# Patient Record
Sex: Female | Born: 1980 | Race: Black or African American | Hispanic: No | Marital: Single | Smoking: Never smoker
Health system: Southern US, Community
[De-identification: ages and names within clinical notes are randomized; demographics above are authoritative.]

## PROBLEM LIST (undated history)

## (undated) DIAGNOSIS — R0602 Shortness of breath: Secondary | ICD-10-CM

## (undated) DIAGNOSIS — R29898 Other symptoms and signs involving the musculoskeletal system: Secondary | ICD-10-CM

## (undated) DIAGNOSIS — K859 Acute pancreatitis without necrosis or infection, unspecified: Secondary | ICD-10-CM

## (undated) DIAGNOSIS — F419 Anxiety disorder, unspecified: Secondary | ICD-10-CM

## (undated) DIAGNOSIS — Z973 Presence of spectacles and contact lenses: Secondary | ICD-10-CM

## (undated) DIAGNOSIS — Z86718 Personal history of other venous thrombosis and embolism: Secondary | ICD-10-CM

## (undated) DIAGNOSIS — R002 Palpitations: Secondary | ICD-10-CM

## (undated) DIAGNOSIS — K567 Ileus, unspecified: Secondary | ICD-10-CM

## (undated) DIAGNOSIS — R5383 Other fatigue: Secondary | ICD-10-CM

## (undated) DIAGNOSIS — G5623 Lesion of ulnar nerve, bilateral upper limbs: Secondary | ICD-10-CM

## (undated) DIAGNOSIS — K5901 Slow transit constipation: Secondary | ICD-10-CM

## (undated) DIAGNOSIS — L0390 Cellulitis, unspecified: Secondary | ICD-10-CM

## (undated) DIAGNOSIS — D259 Leiomyoma of uterus, unspecified: Secondary | ICD-10-CM

## (undated) DIAGNOSIS — M5416 Radiculopathy, lumbar region: Secondary | ICD-10-CM

## (undated) DIAGNOSIS — G4733 Obstructive sleep apnea (adult) (pediatric): Secondary | ICD-10-CM

## (undated) DIAGNOSIS — K581 Irritable bowel syndrome with constipation: Secondary | ICD-10-CM

## (undated) DIAGNOSIS — M549 Dorsalgia, unspecified: Secondary | ICD-10-CM

## (undated) DIAGNOSIS — G8929 Other chronic pain: Secondary | ICD-10-CM

## (undated) DIAGNOSIS — G250 Essential tremor: Secondary | ICD-10-CM

## (undated) DIAGNOSIS — I38 Endocarditis, valve unspecified: Secondary | ICD-10-CM

## (undated) DIAGNOSIS — N3010 Interstitial cystitis (chronic) without hematuria: Secondary | ICD-10-CM

## (undated) DIAGNOSIS — I81 Portal vein thrombosis: Secondary | ICD-10-CM

## (undated) DIAGNOSIS — R000 Tachycardia, unspecified: Secondary | ICD-10-CM

## (undated) DIAGNOSIS — M5126 Other intervertebral disc displacement, lumbar region: Secondary | ICD-10-CM

## (undated) DIAGNOSIS — D649 Anemia, unspecified: Secondary | ICD-10-CM

## (undated) DIAGNOSIS — K219 Gastro-esophageal reflux disease without esophagitis: Secondary | ICD-10-CM

## (undated) HISTORY — DX: Dorsalgia, unspecified: M54.9

## (undated) HISTORY — PX: GASTRECTOMY: SHX58

## (undated) HISTORY — PX: LUMBAR DISC SURGERY: SHX700

## (undated) HISTORY — DX: Anxiety disorder, unspecified: F41.9

## (undated) HISTORY — PX: OTHER SURGICAL HISTORY: SHX169

## (undated) HISTORY — PX: CHOLECYSTECTOMY: SHX55

## (undated) HISTORY — DX: Irritable bowel syndrome with constipation: K58.1

## (undated) HISTORY — DX: Slow transit constipation: K59.01

## (undated) HISTORY — DX: Anemia, unspecified: D64.9

## (undated) HISTORY — DX: Acute pancreatitis without necrosis or infection, unspecified: K85.90

## (undated) HISTORY — DX: Gastro-esophageal reflux disease without esophagitis: K21.9

## (undated) HISTORY — PX: BARIATRIC SURGERY: SHX1103

## (undated) HISTORY — DX: Personal history of other venous thrombosis and embolism: Z86.718

## (undated) HISTORY — PX: APPENDECTOMY: SHX54

## (undated) HISTORY — PX: OVARIAN CYST REMOVAL: SHX89

---
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
## 1999-01-30 DIAGNOSIS — Z8632 Personal history of gestational diabetes: Secondary | ICD-10-CM

## 1999-01-30 HISTORY — DX: Personal history of gestational diabetes: Z86.32

## 2004-12-07 ENCOUNTER — Ambulatory Visit (HOSPITAL_COMMUNITY): Admission: RE | Admit: 2004-12-07 | Discharge: 2004-12-07 | Payer: Self-pay | Admitting: Internal Medicine

## 2004-12-28 ENCOUNTER — Emergency Department (HOSPITAL_COMMUNITY): Admission: EM | Admit: 2004-12-28 | Discharge: 2004-12-28 | Payer: Self-pay | Admitting: Emergency Medicine

## 2005-02-04 ENCOUNTER — Emergency Department (HOSPITAL_COMMUNITY): Admission: EM | Admit: 2005-02-04 | Discharge: 2005-02-04 | Payer: Self-pay | Admitting: Emergency Medicine

## 2005-02-09 ENCOUNTER — Encounter: Admission: RE | Admit: 2005-02-09 | Discharge: 2005-02-09 | Payer: Self-pay | Admitting: Internal Medicine

## 2005-10-12 ENCOUNTER — Encounter: Admission: RE | Admit: 2005-10-12 | Discharge: 2005-10-12 | Payer: Self-pay | Admitting: Internal Medicine

## 2006-08-15 ENCOUNTER — Emergency Department (HOSPITAL_COMMUNITY): Admission: EM | Admit: 2006-08-15 | Discharge: 2006-08-15 | Payer: Self-pay | Admitting: Emergency Medicine

## 2007-09-18 ENCOUNTER — Encounter: Admission: RE | Admit: 2007-09-18 | Discharge: 2007-10-29 | Payer: Self-pay | Admitting: Urology

## 2007-10-31 ENCOUNTER — Encounter: Admission: RE | Admit: 2007-10-31 | Discharge: 2007-10-31 | Payer: Self-pay | Admitting: Internal Medicine

## 2007-12-16 ENCOUNTER — Encounter: Admission: RE | Admit: 2007-12-16 | Discharge: 2008-03-15 | Payer: Self-pay | Admitting: Urology

## 2008-01-16 ENCOUNTER — Ambulatory Visit (HOSPITAL_COMMUNITY): Admission: RE | Admit: 2008-01-16 | Discharge: 2008-01-16 | Payer: Self-pay | Admitting: Obstetrics & Gynecology

## 2008-03-18 ENCOUNTER — Encounter: Admission: RE | Admit: 2008-03-18 | Discharge: 2008-04-15 | Payer: Self-pay | Admitting: Urology

## 2008-05-18 ENCOUNTER — Encounter (INDEPENDENT_AMBULATORY_CARE_PROVIDER_SITE_OTHER): Payer: Self-pay | Admitting: Urology

## 2008-05-18 ENCOUNTER — Ambulatory Visit (HOSPITAL_BASED_OUTPATIENT_CLINIC_OR_DEPARTMENT_OTHER): Admission: RE | Admit: 2008-05-18 | Discharge: 2008-05-18 | Payer: Self-pay | Admitting: Urology

## 2008-08-12 ENCOUNTER — Emergency Department (HOSPITAL_COMMUNITY): Admission: EM | Admit: 2008-08-12 | Discharge: 2008-08-12 | Payer: Self-pay | Admitting: Emergency Medicine

## 2009-04-07 ENCOUNTER — Inpatient Hospital Stay (HOSPITAL_COMMUNITY): Admission: AD | Admit: 2009-04-07 | Discharge: 2009-04-07 | Payer: Self-pay | Admitting: Obstetrics

## 2009-04-08 ENCOUNTER — Ambulatory Visit (HOSPITAL_COMMUNITY): Admission: RE | Admit: 2009-04-08 | Discharge: 2009-04-08 | Payer: Self-pay | Admitting: Obstetrics & Gynecology

## 2009-05-13 ENCOUNTER — Ambulatory Visit (HOSPITAL_COMMUNITY): Admission: RE | Admit: 2009-05-13 | Discharge: 2009-05-13 | Payer: Self-pay | Admitting: Obstetrics & Gynecology

## 2009-06-03 ENCOUNTER — Ambulatory Visit (HOSPITAL_COMMUNITY): Admission: RE | Admit: 2009-06-03 | Discharge: 2009-06-03 | Payer: Self-pay | Admitting: Obstetrics & Gynecology

## 2009-06-23 ENCOUNTER — Ambulatory Visit (HOSPITAL_COMMUNITY): Admission: RE | Admit: 2009-06-23 | Discharge: 2009-06-23 | Payer: Self-pay | Admitting: Obstetrics & Gynecology

## 2009-06-25 ENCOUNTER — Inpatient Hospital Stay (HOSPITAL_COMMUNITY): Admission: AD | Admit: 2009-06-25 | Discharge: 2009-06-25 | Payer: Self-pay | Admitting: Obstetrics & Gynecology

## 2009-06-25 ENCOUNTER — Ambulatory Visit: Payer: Self-pay | Admitting: Family

## 2009-07-11 ENCOUNTER — Inpatient Hospital Stay (HOSPITAL_COMMUNITY): Admission: AD | Admit: 2009-07-11 | Discharge: 2009-07-11 | Payer: Self-pay | Admitting: Obstetrics & Gynecology

## 2009-07-11 ENCOUNTER — Ambulatory Visit: Payer: Self-pay | Admitting: Obstetrics and Gynecology

## 2009-08-04 ENCOUNTER — Ambulatory Visit (HOSPITAL_COMMUNITY): Admission: RE | Admit: 2009-08-04 | Discharge: 2009-08-04 | Payer: Self-pay | Admitting: Obstetrics & Gynecology

## 2009-08-23 ENCOUNTER — Inpatient Hospital Stay (HOSPITAL_COMMUNITY): Admission: AD | Admit: 2009-08-23 | Discharge: 2009-08-23 | Payer: Self-pay | Admitting: Obstetrics

## 2009-08-26 ENCOUNTER — Ambulatory Visit (HOSPITAL_COMMUNITY): Admission: RE | Admit: 2009-08-26 | Discharge: 2009-08-26 | Payer: Self-pay | Admitting: Obstetrics & Gynecology

## 2009-09-15 ENCOUNTER — Ambulatory Visit (HOSPITAL_COMMUNITY): Admission: RE | Admit: 2009-09-15 | Discharge: 2009-09-15 | Payer: Self-pay | Admitting: Obstetrics & Gynecology

## 2009-09-29 ENCOUNTER — Inpatient Hospital Stay (HOSPITAL_COMMUNITY): Admission: AD | Admit: 2009-09-29 | Discharge: 2009-10-03 | Payer: Self-pay | Admitting: Obstetrics & Gynecology

## 2009-10-05 ENCOUNTER — Ambulatory Visit: Payer: Self-pay | Admitting: Cardiology

## 2009-10-05 ENCOUNTER — Inpatient Hospital Stay (HOSPITAL_COMMUNITY): Admission: AD | Admit: 2009-10-05 | Discharge: 2009-10-10 | Payer: Self-pay | Admitting: Obstetrics

## 2009-10-05 ENCOUNTER — Ambulatory Visit: Payer: Self-pay | Admitting: Emergency Medicine

## 2009-10-06 ENCOUNTER — Ambulatory Visit (HOSPITAL_COMMUNITY): Admission: RE | Admit: 2009-10-06 | Discharge: 2009-10-06 | Payer: Self-pay | Admitting: Obstetrics & Gynecology

## 2009-10-17 ENCOUNTER — Ambulatory Visit: Payer: Self-pay | Admitting: Obstetrics and Gynecology

## 2009-10-17 ENCOUNTER — Inpatient Hospital Stay (HOSPITAL_COMMUNITY): Admission: AD | Admit: 2009-10-17 | Discharge: 2009-10-17 | Payer: Self-pay | Admitting: Obstetrics & Gynecology

## 2009-10-20 ENCOUNTER — Ambulatory Visit (HOSPITAL_COMMUNITY): Admission: RE | Admit: 2009-10-20 | Discharge: 2009-10-20 | Payer: Self-pay | Admitting: Obstetrics

## 2009-10-31 ENCOUNTER — Inpatient Hospital Stay (HOSPITAL_COMMUNITY): Admission: AD | Admit: 2009-10-31 | Discharge: 2009-10-31 | Payer: Self-pay | Admitting: Obstetrics

## 2009-10-31 ENCOUNTER — Ambulatory Visit: Payer: Self-pay | Admitting: Family

## 2009-11-01 ENCOUNTER — Ambulatory Visit (HOSPITAL_COMMUNITY): Admission: RE | Admit: 2009-11-01 | Discharge: 2009-11-01 | Payer: Self-pay | Admitting: Obstetrics & Gynecology

## 2009-11-08 ENCOUNTER — Encounter: Payer: Self-pay | Admitting: Obstetrics

## 2009-11-08 ENCOUNTER — Inpatient Hospital Stay (HOSPITAL_COMMUNITY): Admission: AD | Admit: 2009-11-08 | Discharge: 2009-11-11 | Payer: Self-pay | Admitting: Obstetrics

## 2009-11-11 ENCOUNTER — Ambulatory Visit: Payer: Self-pay | Admitting: Obstetrics and Gynecology

## 2009-11-11 ENCOUNTER — Inpatient Hospital Stay (HOSPITAL_COMMUNITY): Admission: AD | Admit: 2009-11-11 | Discharge: 2009-11-11 | Payer: Self-pay | Admitting: Obstetrics

## 2009-11-20 ENCOUNTER — Encounter (INDEPENDENT_AMBULATORY_CARE_PROVIDER_SITE_OTHER): Payer: Self-pay | Admitting: Emergency Medicine

## 2009-11-20 ENCOUNTER — Emergency Department (HOSPITAL_COMMUNITY): Admission: EM | Admit: 2009-11-20 | Discharge: 2009-11-20 | Payer: Self-pay | Admitting: Emergency Medicine

## 2009-11-20 ENCOUNTER — Ambulatory Visit (HOSPITAL_COMMUNITY): Admission: RE | Admit: 2009-11-20 | Discharge: 2009-11-20 | Payer: Self-pay | Admitting: Emergency Medicine

## 2009-11-20 ENCOUNTER — Ambulatory Visit: Payer: Self-pay | Admitting: Vascular Surgery

## 2010-02-18 ENCOUNTER — Encounter: Payer: Self-pay | Admitting: Obstetrics & Gynecology

## 2010-02-19 ENCOUNTER — Encounter: Payer: Self-pay | Admitting: Obstetrics & Gynecology

## 2010-02-19 ENCOUNTER — Encounter: Payer: Self-pay | Admitting: Obstetrics

## 2010-04-12 LAB — BASIC METABOLIC PANEL
BUN: 6 mg/dL (ref 6–23)
CO2: 26 mEq/L (ref 19–32)
Calcium: 8.9 mg/dL (ref 8.4–10.5)
Chloride: 111 mEq/L (ref 96–112)
Creatinine, Ser: 0.72 mg/dL (ref 0.4–1.2)
GFR calc Af Amer: >60 mL/min (ref >60)
GFR calc non Af Amer: >60 mL/min (ref >60)
Glucose, Bld: 89 mg/dL (ref 70–99)
Potassium: 3.4 mEq/L — ABNORMAL LOW (ref 3.5–5.1)
Sodium: 141 mEq/L (ref 135–145)

## 2010-04-12 LAB — POCT CARDIAC MARKERS
CKMB, poc: <1 ng/mL — ABNORMAL LOW (ref 1.0–8.0)
Myoglobin, poc: 43.5 ng/mL (ref 12–200)
Troponin i, poc: <0.05 ng/mL (ref 0.00–0.09)

## 2010-04-12 LAB — CBC
HCT: 34.8 % — ABNORMAL LOW (ref 36.0–46.0)
Hemoglobin: 11.2 g/dL — ABNORMAL LOW (ref 12.0–15.0)
MCH: 24.7 pg — ABNORMAL LOW (ref 26.0–34.0)
MCHC: 32.3 g/dL (ref 30.0–36.0)
MCV: 76.6 fL — ABNORMAL LOW (ref 78.0–100.0)
Platelets: 326 10*3/uL (ref 150–400)
RBC: 4.54 MIL/uL (ref 3.87–5.11)
RDW: 17.1 % — ABNORMAL HIGH (ref 11.5–15.5)
WBC: 5.8 10*3/uL (ref 4.0–10.5)

## 2010-04-12 LAB — DIFFERENTIAL
Basophils Absolute: 0 10*3/uL (ref 0.0–0.1)
Basophils Relative: 1 % (ref 0–1)
Eosinophils Absolute: 0.1 10*3/uL (ref 0.0–0.7)
Eosinophils Relative: 2 % (ref 0–5)
Lymphocytes Relative: 26 % (ref 12–46)
Lymphs Abs: 1.5 10*3/uL (ref 0.7–4.0)
Monocytes Absolute: 0.6 10*3/uL (ref 0.1–1.0)
Monocytes Relative: 10 % (ref 3–12)
Neutro Abs: 3.6 10*3/uL (ref 1.7–7.7)
Neutrophils Relative %: 61 % (ref 43–77)

## 2010-04-12 LAB — D-DIMER, QUANTITATIVE: D-Dimer, Quant: 1.82 ug/mL-FEU — ABNORMAL HIGH (ref 0.00–0.48)

## 2010-04-13 LAB — URINE CULTURE
Colony Count: >=100000
Colony Count: NO GROWTH
Culture  Setup Time: 201109020536
Culture  Setup Time: 201110150111
Culture: NO GROWTH
Special Requests: NEGATIVE

## 2010-04-13 LAB — MRSA PCR SCREENING: MRSA by PCR: NEGATIVE

## 2010-04-13 LAB — GLUCOSE, CAPILLARY
Glucose-Capillary: 102 mg/dL — ABNORMAL HIGH (ref 70–99)
Glucose-Capillary: 102 mg/dL — ABNORMAL HIGH (ref 70–99)
Glucose-Capillary: 102 mg/dL — ABNORMAL HIGH (ref 70–99)
Glucose-Capillary: 103 mg/dL — ABNORMAL HIGH (ref 70–99)
Glucose-Capillary: 103 mg/dL — ABNORMAL HIGH (ref 70–99)
Glucose-Capillary: 107 mg/dL — ABNORMAL HIGH (ref 70–99)
Glucose-Capillary: 107 mg/dL — ABNORMAL HIGH (ref 70–99)
Glucose-Capillary: 110 mg/dL — ABNORMAL HIGH (ref 70–99)
Glucose-Capillary: 112 mg/dL — ABNORMAL HIGH (ref 70–99)
Glucose-Capillary: 116 mg/dL — ABNORMAL HIGH (ref 70–99)
Glucose-Capillary: 117 mg/dL — ABNORMAL HIGH (ref 70–99)
Glucose-Capillary: 118 mg/dL — ABNORMAL HIGH (ref 70–99)
Glucose-Capillary: 118 mg/dL — ABNORMAL HIGH (ref 70–99)
Glucose-Capillary: 124 mg/dL — ABNORMAL HIGH (ref 70–99)
Glucose-Capillary: 131 mg/dL — ABNORMAL HIGH (ref 70–99)
Glucose-Capillary: 132 mg/dL — ABNORMAL HIGH (ref 70–99)
Glucose-Capillary: 134 mg/dL — ABNORMAL HIGH (ref 70–99)
Glucose-Capillary: 135 mg/dL — ABNORMAL HIGH (ref 70–99)
Glucose-Capillary: 136 mg/dL — ABNORMAL HIGH (ref 70–99)
Glucose-Capillary: 141 mg/dL — ABNORMAL HIGH (ref 70–99)
Glucose-Capillary: 150 mg/dL — ABNORMAL HIGH (ref 70–99)
Glucose-Capillary: 154 mg/dL — ABNORMAL HIGH (ref 70–99)
Glucose-Capillary: 160 mg/dL — ABNORMAL HIGH (ref 70–99)
Glucose-Capillary: 162 mg/dL — ABNORMAL HIGH (ref 70–99)
Glucose-Capillary: 171 mg/dL — ABNORMAL HIGH (ref 70–99)
Glucose-Capillary: 46 mg/dL — ABNORMAL LOW (ref 70–99)
Glucose-Capillary: 55 mg/dL — ABNORMAL LOW (ref 70–99)
Glucose-Capillary: 61 mg/dL — ABNORMAL LOW (ref 70–99)
Glucose-Capillary: 61 mg/dL — ABNORMAL LOW (ref 70–99)
Glucose-Capillary: 62 mg/dL — ABNORMAL LOW (ref 70–99)
Glucose-Capillary: 72 mg/dL (ref 70–99)
Glucose-Capillary: 73 mg/dL (ref 70–99)
Glucose-Capillary: 73 mg/dL (ref 70–99)
Glucose-Capillary: 74 mg/dL (ref 70–99)
Glucose-Capillary: 76 mg/dL (ref 70–99)
Glucose-Capillary: 76 mg/dL (ref 70–99)
Glucose-Capillary: 76 mg/dL (ref 70–99)
Glucose-Capillary: 79 mg/dL (ref 70–99)
Glucose-Capillary: 80 mg/dL (ref 70–99)
Glucose-Capillary: 84 mg/dL (ref 70–99)
Glucose-Capillary: 88 mg/dL (ref 70–99)
Glucose-Capillary: 90 mg/dL (ref 70–99)
Glucose-Capillary: 91 mg/dL (ref 70–99)
Glucose-Capillary: 95 mg/dL (ref 70–99)
Glucose-Capillary: 95 mg/dL (ref 70–99)
Glucose-Capillary: 97 mg/dL (ref 70–99)

## 2010-04-13 LAB — COMPREHENSIVE METABOLIC PANEL
ALT: 21 U/L (ref 0–35)
ALT: 23 U/L (ref 0–35)
AST: 20 U/L (ref 0–37)
AST: 21 U/L (ref 0–37)
Albumin: 2.8 g/dL — ABNORMAL LOW (ref 3.5–5.2)
Albumin: 2.9 g/dL — ABNORMAL LOW (ref 3.5–5.2)
Alkaline Phosphatase: 198 U/L — ABNORMAL HIGH (ref 39–117)
Alkaline Phosphatase: 162 U/L — ABNORMAL HIGH (ref 39–117)
BUN: 3 mg/dL — ABNORMAL LOW (ref 6–23)
BUN: 3 mg/dL — ABNORMAL LOW (ref 6–23)
CO2: 21 mEq/L (ref 19–32)
CO2: 22 mEq/L (ref 19–32)
Calcium: 9.2 mg/dL (ref 8.4–10.5)
Calcium: 9.2 mg/dL (ref 8.4–10.5)
Chloride: 106 mEq/L (ref 96–112)
Chloride: 107 mEq/L (ref 96–112)
Creatinine, Ser: 0.59 mg/dL (ref 0.4–1.2)
Creatinine, Ser: 0.62 mg/dL (ref 0.4–1.2)
GFR calc Af Amer: >60 mL/min (ref >60)
GFR calc Af Amer: >60 mL/min (ref >60)
GFR calc non Af Amer: >60 mL/min (ref >60)
GFR calc non Af Amer: >60 mL/min (ref >60)
Glucose, Bld: 65 mg/dL — ABNORMAL LOW (ref 70–99)
Glucose, Bld: 127 mg/dL — ABNORMAL HIGH (ref 70–99)
Potassium: 4.1 mEq/L (ref 3.5–5.1)
Potassium: 3.6 mEq/L (ref 3.5–5.1)
Sodium: 136 mEq/L (ref 135–145)
Sodium: 136 mEq/L (ref 135–145)
Total Bilirubin: 0.6 mg/dL (ref 0.3–1.2)
Total Bilirubin: 0.5 mg/dL (ref 0.3–1.2)
Total Protein: 6.8 g/dL (ref 6.0–8.3)
Total Protein: 6.6 g/dL (ref 6.0–8.3)

## 2010-04-13 LAB — CK TOTAL AND CKMB (NOT AT ARMC)
CK, MB: 1.4 ng/mL (ref 0.3–4.0)
Relative Index: INVALID (ref 0.0–2.5)
Total CK: 22 U/L (ref 7–177)

## 2010-04-13 LAB — CBC
HCT: 30 % — ABNORMAL LOW (ref 36.0–46.0)
HCT: 33.3 % — ABNORMAL LOW (ref 36.0–46.0)
HCT: 34.1 % — ABNORMAL LOW (ref 36.0–46.0)
HCT: 35.7 % — ABNORMAL LOW (ref 36.0–46.0)
HCT: 36.3 % (ref 36.0–46.0)
HCT: 37.2 % (ref 36.0–46.0)
HCT: 37.6 % (ref 36.0–46.0)
Hemoglobin: 10.9 g/dL — ABNORMAL LOW (ref 12.0–15.0)
Hemoglobin: 11 g/dL — ABNORMAL LOW (ref 12.0–15.0)
Hemoglobin: 11.7 g/dL — ABNORMAL LOW (ref 12.0–15.0)
Hemoglobin: 11.8 g/dL — ABNORMAL LOW (ref 12.0–15.0)
Hemoglobin: 11.9 g/dL — ABNORMAL LOW (ref 12.0–15.0)
Hemoglobin: 12.2 g/dL (ref 12.0–15.0)
Hemoglobin: 9.8 g/dL — ABNORMAL LOW (ref 12.0–15.0)
MCH: 25 pg — ABNORMAL LOW (ref 26.0–34.0)
MCH: 25.2 pg — ABNORMAL LOW (ref 26.0–34.0)
MCH: 25.3 pg — ABNORMAL LOW (ref 26.0–34.0)
MCH: 25.5 pg — ABNORMAL LOW (ref 26.0–34.0)
MCH: 25.7 pg — ABNORMAL LOW (ref 26.0–34.0)
MCH: 26.1 pg (ref 26.0–34.0)
MCH: 26.1 pg (ref 26.0–34.0)
MCHC: 32.1 g/dL (ref 30.0–36.0)
MCHC: 32.4 g/dL (ref 30.0–36.0)
MCHC: 32.4 g/dL (ref 30.0–36.0)
MCHC: 32.6 g/dL (ref 30.0–36.0)
MCHC: 32.7 g/dL (ref 30.0–36.0)
MCHC: 32.7 g/dL (ref 30.0–36.0)
MCHC: 32.8 g/dL (ref 30.0–36.0)
MCV: 77.8 fL — ABNORMAL LOW (ref 78.0–100.0)
MCV: 78 fL (ref 78.0–100.0)
MCV: 78.1 fL (ref 78.0–100.0)
MCV: 78.1 fL (ref 78.0–100.0)
MCV: 78.7 fL (ref 78.0–100.0)
MCV: 79.6 fL (ref 78.0–100.0)
MCV: 79.7 fL (ref 78.0–100.0)
Platelets: 193 10*3/uL (ref 150–400)
Platelets: 208 10*3/uL (ref 150–400)
Platelets: 227 10*3/uL (ref 150–400)
Platelets: 247 10*3/uL (ref 150–400)
Platelets: 267 10*3/uL (ref 150–400)
Platelets: 267 10*3/uL (ref 150–400)
Platelets: 277 10*3/uL (ref 150–400)
RBC: 3.85 MIL/uL — ABNORMAL LOW (ref 3.87–5.11)
RBC: 4.18 MIL/uL (ref 3.87–5.11)
RBC: 4.37 MIL/uL (ref 3.87–5.11)
RBC: 4.49 MIL/uL (ref 3.87–5.11)
RBC: 4.61 MIL/uL (ref 3.87–5.11)
RBC: 4.77 MIL/uL (ref 3.87–5.11)
RBC: 4.83 MIL/uL (ref 3.87–5.11)
RDW: 15.1 % (ref 11.5–15.5)
RDW: 15.3 % (ref 11.5–15.5)
RDW: 15.4 % (ref 11.5–15.5)
RDW: 15.8 % — ABNORMAL HIGH (ref 11.5–15.5)
RDW: 15.9 % — ABNORMAL HIGH (ref 11.5–15.5)
RDW: 16 % — ABNORMAL HIGH (ref 11.5–15.5)
RDW: 16.4 % — ABNORMAL HIGH (ref 11.5–15.5)
WBC: 7.4 10*3/uL (ref 4.0–10.5)
WBC: 8.1 10*3/uL (ref 4.0–10.5)
WBC: 8.3 10*3/uL (ref 4.0–10.5)
WBC: 8.4 10*3/uL (ref 4.0–10.5)
WBC: 8.8 10*3/uL (ref 4.0–10.5)
WBC: 8.8 10*3/uL (ref 4.0–10.5)
WBC: 9.5 10*3/uL (ref 4.0–10.5)

## 2010-04-13 LAB — URINALYSIS, ROUTINE W REFLEX MICROSCOPIC
Bilirubin Urine: NEGATIVE
Bilirubin Urine: NEGATIVE
Glucose, UA: NEGATIVE mg/dL
Glucose, UA: NEGATIVE mg/dL
Hgb urine dipstick: NEGATIVE
Ketones, ur: NEGATIVE mg/dL
Ketones, ur: NEGATIVE mg/dL
Nitrite: NEGATIVE
Nitrite: NEGATIVE
Protein, ur: NEGATIVE mg/dL
Protein, ur: NEGATIVE mg/dL
Specific Gravity, Urine: 1.01 (ref 1.005–1.030)
Specific Gravity, Urine: <1.005 — ABNORMAL LOW (ref 1.005–1.030)
Urobilinogen, UA: 0.2 mg/dL (ref 0.0–1.0)
Urobilinogen, UA: 0.2 mg/dL (ref 0.0–1.0)
pH: 6 (ref 5.0–8.0)
pH: 7 (ref 5.0–8.0)

## 2010-04-13 LAB — BLOOD GAS, ARTERIAL
Acid-base deficit: 1 mmol/L (ref 0.0–2.0)
Acid-base deficit: 2.3 mmol/L — ABNORMAL HIGH (ref 0.0–2.0)
Bicarbonate: 20.3 mEq/L (ref 20.0–24.0)
Bicarbonate: 22 mEq/L (ref 20.0–24.0)
Drawn by: 132
Drawn by: 308031
FIO2: 0.21 %
FIO2: 1 %
O2 Content: 10 L/min
O2 Saturation: 100 %
O2 Saturation: 96 %
TCO2: 21.2 mmol/L (ref 0–100)
TCO2: 23 mmol/L (ref 0–100)
pCO2 arterial: 30.4 mmHg — ABNORMAL LOW (ref 35.0–45.0)
pCO2 arterial: 32.3 mmHg — ABNORMAL LOW (ref 35.0–45.0)
pH, Arterial: 7.439 — ABNORMAL HIGH (ref 7.350–7.400)
pH, Arterial: 7.448 — ABNORMAL HIGH (ref 7.350–7.400)
pO2, Arterial: 517 mmHg — ABNORMAL HIGH (ref 80.0–100.0)
pO2, Arterial: 75.5 mmHg — ABNORMAL LOW (ref 80.0–100.0)

## 2010-04-13 LAB — URINALYSIS, DIPSTICK ONLY
Bilirubin Urine: NEGATIVE
Glucose, UA: NEGATIVE mg/dL
Hgb urine dipstick: NEGATIVE
Ketones, ur: 40 mg/dL — AB
Leukocytes, UA: NEGATIVE
Nitrite: NEGATIVE
Protein, ur: NEGATIVE mg/dL
Specific Gravity, Urine: 1.02 (ref 1.005–1.030)
Urobilinogen, UA: 0.2 mg/dL (ref 0.0–1.0)
pH: 6 (ref 5.0–8.0)

## 2010-04-13 LAB — RH IMMUNE GLOB WKUP(>/=20WKS)(NOT WOMEN'S HOSP)
Fetal Screen: NEGATIVE
Unit division: 0

## 2010-04-13 LAB — BASIC METABOLIC PANEL
BUN: 6 mg/dL (ref 6–23)
CO2: 21 mEq/L (ref 19–32)
Calcium: 9 mg/dL (ref 8.4–10.5)
Chloride: 107 mEq/L (ref 96–112)
Creatinine, Ser: 0.62 mg/dL (ref 0.4–1.2)
GFR calc Af Amer: >60 mL/min (ref >60)
GFR calc non Af Amer: >60 mL/min (ref >60)
Glucose, Bld: 121 mg/dL — ABNORMAL HIGH (ref 70–99)
Potassium: 3.5 mEq/L (ref 3.5–5.1)
Sodium: 137 mEq/L (ref 135–145)

## 2010-04-13 LAB — SURGICAL PCR SCREEN
MRSA, PCR: NEGATIVE
Staphylococcus aureus: POSITIVE — AB

## 2010-04-13 LAB — RPR
RPR Ser Ql: NONREACTIVE
RPR Ser Ql: NONREACTIVE
RPR Ser Ql: NONREACTIVE

## 2010-04-13 LAB — URINE MICROSCOPIC-ADD ON

## 2010-04-13 LAB — D-DIMER, QUANTITATIVE: D-Dimer, Quant: 0.39 ug/mL-FEU (ref 0.00–0.48)

## 2010-04-13 LAB — BRAIN NATRIURETIC PEPTIDE: Pro B Natriuretic peptide (BNP): <30 pg/mL (ref 0.0–100.0)

## 2010-04-13 LAB — TROPONIN I: Troponin I: 0.05 ng/mL (ref 0.00–0.06)

## 2010-04-13 LAB — GLUCOSE, RANDOM: Glucose, Bld: 71 mg/dL (ref 70–99)

## 2010-04-15 LAB — COMPREHENSIVE METABOLIC PANEL
ALT: 16 U/L (ref 0–35)
AST: 15 U/L (ref 0–37)
Albumin: 2.8 g/dL — ABNORMAL LOW (ref 3.5–5.2)
Alkaline Phosphatase: 125 U/L — ABNORMAL HIGH (ref 39–117)
BUN: 4 mg/dL — ABNORMAL LOW (ref 6–23)
CO2: 23 mEq/L (ref 19–32)
Calcium: 8.8 mg/dL (ref 8.4–10.5)
Chloride: 104 mEq/L (ref 96–112)
Creatinine, Ser: 0.6 mg/dL (ref 0.4–1.2)
GFR calc Af Amer: >60 mL/min (ref >60)
GFR calc non Af Amer: >60 mL/min (ref >60)
Glucose, Bld: 78 mg/dL (ref 70–99)
Potassium: 3.5 mEq/L (ref 3.5–5.1)
Sodium: 134 mEq/L — ABNORMAL LOW (ref 135–145)
Total Bilirubin: 0.2 mg/dL — ABNORMAL LOW (ref 0.3–1.2)
Total Protein: 6.8 g/dL (ref 6.0–8.3)

## 2010-04-15 LAB — DIFFERENTIAL
Basophils Absolute: 0 10*3/uL (ref 0.0–0.1)
Basophils Relative: 0 % (ref 0–1)
Eosinophils Absolute: 0.1 10*3/uL (ref 0.0–0.7)
Eosinophils Relative: 1 % (ref 0–5)
Lymphocytes Relative: 14 % (ref 12–46)
Lymphs Abs: 1.5 10*3/uL (ref 0.7–4.0)
Monocytes Absolute: 0.6 10*3/uL (ref 0.1–1.0)
Monocytes Relative: 6 % (ref 3–12)
Neutro Abs: 8.6 10*3/uL — ABNORMAL HIGH (ref 1.7–7.7)
Neutrophils Relative %: 79 % — ABNORMAL HIGH (ref 43–77)

## 2010-04-15 LAB — CBC
HCT: 33.9 % — ABNORMAL LOW (ref 36.0–46.0)
Hemoglobin: 11.2 g/dL — ABNORMAL LOW (ref 12.0–15.0)
MCH: 26.5 pg (ref 26.0–34.0)
MCHC: 33.1 g/dL (ref 30.0–36.0)
MCV: 80.1 fL (ref 78.0–100.0)
Platelets: 275 10*3/uL (ref 150–400)
RBC: 4.23 MIL/uL (ref 3.87–5.11)
RDW: 15.1 % (ref 11.5–15.5)
WBC: 10.8 10*3/uL — ABNORMAL HIGH (ref 4.0–10.5)

## 2010-04-15 LAB — RH IMMUNE GLOBULIN WORKUP (NOT WOMEN'S HOSP)
ABO/RH(D): B NEG
Antibody Screen: NEGATIVE

## 2010-04-17 LAB — WET PREP, GENITAL
Clue Cells Wet Prep HPF POC: NONE SEEN
Clue Cells Wet Prep HPF POC: NONE SEEN
Trich, Wet Prep: NONE SEEN
Trich, Wet Prep: NONE SEEN
Yeast Wet Prep HPF POC: NONE SEEN
Yeast Wet Prep HPF POC: NONE SEEN

## 2010-04-17 LAB — RH IMMUNE GLOB WKUP(>/=20WKS)(NOT WOMEN'S HOSP)
Antibody Screen: NEGATIVE
Fetal Screen: NEGATIVE

## 2010-04-17 LAB — GC/CHLAMYDIA PROBE AMP, GENITAL
Chlamydia, DNA Probe: NEGATIVE
GC Probe Amp, Genital: NEGATIVE

## 2010-04-17 LAB — URINALYSIS, ROUTINE W REFLEX MICROSCOPIC
Bilirubin Urine: NEGATIVE
Bilirubin Urine: NEGATIVE
Glucose, UA: NEGATIVE mg/dL
Glucose, UA: NEGATIVE mg/dL
Hgb urine dipstick: NEGATIVE
Hgb urine dipstick: NEGATIVE
Ketones, ur: NEGATIVE mg/dL
Ketones, ur: NEGATIVE mg/dL
Nitrite: NEGATIVE
Nitrite: NEGATIVE
Protein, ur: NEGATIVE mg/dL
Protein, ur: NEGATIVE mg/dL
Specific Gravity, Urine: 1.01 (ref 1.005–1.030)
Specific Gravity, Urine: 1.015 (ref 1.005–1.030)
Urobilinogen, UA: 0.2 mg/dL (ref 0.0–1.0)
Urobilinogen, UA: 0.2 mg/dL (ref 0.0–1.0)
pH: 6 (ref 5.0–8.0)
pH: 6.5 (ref 5.0–8.0)

## 2010-04-17 LAB — RH IMMUNE GLOBULIN WORKUP (NOT WOMEN'S HOSP): ABO/RH(D): B NEG

## 2010-04-24 LAB — URINALYSIS, ROUTINE W REFLEX MICROSCOPIC
Bilirubin Urine: NEGATIVE
Glucose, UA: NEGATIVE mg/dL
Hgb urine dipstick: NEGATIVE
Ketones, ur: NEGATIVE mg/dL
Nitrite: NEGATIVE
Protein, ur: NEGATIVE mg/dL
Specific Gravity, Urine: 1.01 (ref 1.005–1.030)
Urobilinogen, UA: 0.2 mg/dL (ref 0.0–1.0)
pH: 5.5 (ref 5.0–8.0)

## 2010-04-24 LAB — RH IMMUNE GLOBULIN WORKUP (NOT WOMEN'S HOSP)
ABO/RH(D): B NEG
Antibody Screen: NEGATIVE

## 2010-04-24 LAB — WET PREP, GENITAL
Trich, Wet Prep: NONE SEEN
Yeast Wet Prep HPF POC: NONE SEEN

## 2010-04-24 LAB — GC/CHLAMYDIA PROBE AMP, GENITAL
Chlamydia, DNA Probe: NEGATIVE
GC Probe Amp, Genital: NEGATIVE

## 2010-04-24 LAB — ABO/RH: ABO/RH(D): B NEG

## 2010-04-24 LAB — HCG, QUANTITATIVE, PREGNANCY: hCG, Beta Chain, Quant, S: 61030 m[IU]/mL — ABNORMAL HIGH (ref <5)

## 2010-05-07 LAB — BASIC METABOLIC PANEL
BUN: 6 mg/dL (ref 6–23)
CO2: 24 mEq/L (ref 19–32)
Calcium: 9 mg/dL (ref 8.4–10.5)
Chloride: 109 mEq/L (ref 96–112)
Creatinine, Ser: 0.87 mg/dL (ref 0.4–1.2)
GFR calc Af Amer: >60 mL/min (ref >60)
GFR calc non Af Amer: >60 mL/min (ref >60)
Glucose, Bld: 135 mg/dL — ABNORMAL HIGH (ref 70–99)
Potassium: 3.3 mEq/L — ABNORMAL LOW (ref 3.5–5.1)
Sodium: 138 mEq/L (ref 135–145)

## 2010-05-07 LAB — POCT CARDIAC MARKERS
CKMB, poc: <1 ng/mL — ABNORMAL LOW (ref 1.0–8.0)
CKMB, poc: <1 ng/mL — ABNORMAL LOW (ref 1.0–8.0)
Myoglobin, poc: 43.7 ng/mL (ref 12–200)
Myoglobin, poc: 46.1 ng/mL (ref 12–200)
Troponin i, poc: <0.05 ng/mL (ref 0.00–0.09)
Troponin i, poc: <0.05 ng/mL (ref 0.00–0.09)

## 2010-05-07 LAB — CBC
HCT: 34.5 % — ABNORMAL LOW (ref 36.0–46.0)
Hemoglobin: 11.4 g/dL — ABNORMAL LOW (ref 12.0–15.0)
MCHC: 33.1 g/dL (ref 30.0–36.0)
MCV: 79.9 fL (ref 78.0–100.0)
Platelets: 304 10*3/uL (ref 150–400)
RBC: 4.32 MIL/uL (ref 3.87–5.11)
RDW: 15.1 % (ref 11.5–15.5)
WBC: 6.8 10*3/uL (ref 4.0–10.5)

## 2010-05-07 LAB — DIFFERENTIAL
Basophils Absolute: 0.1 10*3/uL (ref 0.0–0.1)
Basophils Relative: 1 % (ref 0–1)
Eosinophils Absolute: 0.1 10*3/uL (ref 0.0–0.7)
Eosinophils Relative: 2 % (ref 0–5)
Lymphocytes Relative: 31 % (ref 12–46)
Lymphs Abs: 2.1 10*3/uL (ref 0.7–4.0)
Monocytes Absolute: 0.6 10*3/uL (ref 0.1–1.0)
Monocytes Relative: 9 % (ref 3–12)
Neutro Abs: 3.8 10*3/uL (ref 1.7–7.7)
Neutrophils Relative %: 57 % (ref 43–77)

## 2010-05-10 LAB — POCT PREGNANCY, URINE: Preg Test, Ur: NEGATIVE

## 2010-05-10 LAB — POCT HEMOGLOBIN-HEMACUE: Hemoglobin: 13.4 g/dL (ref 12.0–15.0)

## 2010-06-13 NOTE — Op Note (Signed)
NAME:  Laura Shah, Laura Shah             ACCOUNT NO.:  [id number]   MEDICAL RECORD NO.:  [id number]          PATIENT TYPE:  AMB   LOCATION:  SDC                           FACILITY:  [facility]   PHYSICIAN:  Roseanna Rainbow, M.D.DATE OF BIRTH:  03-10-1980   DATE OF PROCEDURE:  DATE OF DISCHARGE:                               OPERATIVE REPORT   PREOPERATIVE DIAGNOSIS:  Chronic pelvic pain.   POSTOPERATIVE DIAGNOSIS:  Omental adhesions.   PROCEDURE:  Diagnostic laparoscopy, lysis of adhesions.   SURGEONS:  1. Antionette Char, MD  2. Charles A. Clearance Coots, MD   ANESTHESIA:  General endotracheal.   ESTIMATED BLOOD LOSS:  Minimal.   COMPLICATIONS:  None.   IV FLUIDS AND URINE OUTPUT:  As per anesthesiology.   PROCEDURES:  The patient was taken to the operating room with an IV  running.  She was given general anesthesia and placed in the dorsal  lithotomy position and prepped and draped in the usual sterile fashion.  After a time-out had been completed, a bivalve speculum was placed in  the patient's vagina.  The anterior lip of the cervix was grasped with a  single-tooth tenaculum.  A Hulka manipulator was then advanced into the  uterus and secured to the anterior lip of the cervix.  The single-tooth  tenaculum and speculum were then removed.  An infraumbilical skin  incision was then made with the scalpel.  Using the OptiVu, a trocar and  sleeve were then advanced into the abdomen under direct visualization.  The abdomen was then insufflated with CO2 gas.  A second 5-mm port was  placed in the right lower quadrant.  A survey of the abdominopelvic  viscera revealed the above findings.  The patient had a previous  appendectomy and there was scarring near the cecum.  The patient also  had a previous cesarean delivery, and there was scarring involving the  anterior cul-de-sac.  There were also omental adhesions in the midline  to the parietal peritoneum from the previous laparotomy.  The  remainder  of the abdominal pelvic viscera were normal appearing.  The EndoShears  were then introduced through the 5-mm port, and the above-noted omental  adhesions were divided using sharp dissection and the monopolar  coagulation current.  The 5-mm port was then removed.  The  infraumbilical port was then removed.  The fascial incision for the  infraumbilical port was reapproximated with an figure-of-eight suture of  0 Vicryl.  The subcuticular layer was reapproximated with an  interrupted suture of 3-0 Vicryl.  The skin incisions were  reapproximated with Dermabond.  The Hulka manipulator was removed with  minimal bleeding noted from the cervix.  At the close of the procedure,  the instrument and pack counts were said to be correct x2.  The patient  was taken to the PACU, awake and in stable condition.      Roseanna Rainbow, M.D.  Electronically Signed     LAJ/MEDQ  D:  01/16/2008  T:  01/17/2008  Job:  [id number]

## 2010-06-13 NOTE — Op Note (Signed)
NAME:  Laura Shah, Laura Shah             ACCOUNT NO.:  [id number]   MEDICAL RECORD NO.:  [id number]          PATIENT TYPE:  AMB   LOCATION:  [facility]                         [facility]ACILITY:  [facility]   PHYSICIAN:  Lindaann Slough, M.D.  DATE OF BIRTH:  July 15, 1980   DATE OF PROCEDURE:  05/18/2008  DATE OF DISCHARGE:                               OPERATIVE REPORT   PREOPERATIVE DIAGNOSIS:  Pelvic pain, chronic interstitial cystitis.   POSTOPERATIVE DIAGNOSIS:  Pelvic pain, chronic interstitial cystitis.   PROCEDURE:  Cystoscopy, bladder biopsy, meatal dilation and bladder  instillation of Pyridium and Marcaine.   SURGEON:  Danae Chen, M.D.   ANESTHESIA:  General.   INDICATIONS:  The patient is a 30 year old female who has been  complaining of pelvic pain, frequency and urgency.  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 She is on Flomax and physical  therapy.  However, she has continued to have pelvic pain.  Renal  ultrasound is normal.  She is scheduled today for cystoscopy, HOD,  meatal dilation, bladder biopsy and bladder instillation of Pyridium and  Marcaine.   DESCRIPTION OF PROCEDURE:  The patient was identified by her wrist band  and proper time-out was taken.   Under general anesthesia she was prepped and draped and placed in the  dorsal lithotomy position.  A panendoscope was inserted in the bladder.  The bladder mucosa is normal.  There is no evidence of stone or tumor in  the bladder.  The ureteral orifices are in normal position and shape  with clear efflux.  The bladder was then distended for about 10 minutes.  After the bladder distention there was no evidence of submucosal  hemorrhage.  However, the bladder mucosa was reddened.  The bladder  capacity is about 500 mL.   With the cold cup biopsy forceps, a random bladder biopsy was done.  The  areas of biopsy were then fulgurated with the Bugbee electrode.  The  cystoscope was then removed.  The meatus was then  dilated with #30-  [place].  Then 400 mg of Pyridium in 15 mL of 0.5% Marcaine were  instilled in the bladder.   The patient tolerated the procedure well and left the OR in satisfactory  condition to post anesthesia care unit.      Lindaann Slough, M.D.  Electronically Signed    MN/MEDQ  D:  05/18/2008  T:  05/18/2008  Job:  [id number]   cc:   Roseanna Rainbow, M.D.  Fax: [phone]

## 2010-11-03 LAB — CBC
HCT: 38.2 % (ref 36.0–46.0)
Hemoglobin: 12.4 g/dL (ref 12.0–15.0)
MCHC: 32.5 g/dL (ref 30.0–36.0)
MCV: 78.7 fL (ref 78.0–100.0)
Platelets: 314 10*3/uL (ref 150–400)
RBC: 4.85 MIL/uL (ref 3.87–5.11)
RDW: 15.6 % — ABNORMAL HIGH (ref 11.5–15.5)
WBC: 5.8 10*3/uL (ref 4.0–10.5)

## 2010-11-03 LAB — PREGNANCY, URINE: Preg Test, Ur: NEGATIVE

## 2011-04-23 ENCOUNTER — Other Ambulatory Visit (HOSPITAL_COMMUNITY)
Admission: RE | Admit: 2011-04-23 | Discharge: 2011-04-23 | Disposition: A | Discharge status: Home / Self Care | Payer: BC Managed Care – PPO | Source: Ambulatory Visit | Attending: Family Medicine | Admitting: Family Medicine

## 2011-04-23 DIAGNOSIS — Z1159 Encounter for screening for other viral diseases: Secondary | ICD-10-CM | POA: Insufficient documentation

## 2011-04-23 DIAGNOSIS — Z124 Encounter for screening for malignant neoplasm of cervix: Secondary | ICD-10-CM | POA: Insufficient documentation

## 2011-04-24 ENCOUNTER — Other Ambulatory Visit: Payer: Self-pay | Admitting: Family Medicine

## 2011-04-26 ENCOUNTER — Other Ambulatory Visit: Payer: Self-pay | Admitting: Family Medicine

## 2011-04-26 DIAGNOSIS — Z975 Presence of (intrauterine) contraceptive device: Secondary | ICD-10-CM

## 2011-05-02 ENCOUNTER — Other Ambulatory Visit: Payer: Self-pay | Admitting: Family Medicine

## 2011-05-02 DIAGNOSIS — Z975 Presence of (intrauterine) contraceptive device: Secondary | ICD-10-CM

## 2011-05-03 ENCOUNTER — Ambulatory Visit
Admission: RE | Admit: 2011-05-03 | Discharge: 2011-05-03 | Disposition: A | Discharge status: Home / Self Care | Payer: BC Managed Care – PPO | Source: Ambulatory Visit | Attending: Family Medicine | Admitting: Family Medicine

## 2011-05-03 DIAGNOSIS — Z975 Presence of (intrauterine) contraceptive device: Secondary | ICD-10-CM

## 2011-05-17 ENCOUNTER — Encounter (HOSPITAL_COMMUNITY): Payer: Self-pay | Admitting: *Deleted

## 2011-05-17 ENCOUNTER — Emergency Department (HOSPITAL_COMMUNITY): Payer: BC Managed Care – PPO

## 2011-05-17 ENCOUNTER — Emergency Department (HOSPITAL_COMMUNITY)
Admission: EM | Admit: 2011-05-17 | Discharge: 2011-05-17 | Disposition: A | Discharge status: Home / Self Care | Payer: BC Managed Care – PPO | Attending: Emergency Medicine | Admitting: Emergency Medicine

## 2011-05-17 DIAGNOSIS — E669 Obesity, unspecified: Secondary | ICD-10-CM | POA: Insufficient documentation

## 2011-05-17 DIAGNOSIS — R079 Chest pain, unspecified: Secondary | ICD-10-CM | POA: Insufficient documentation

## 2011-05-17 HISTORY — DX: Interstitial cystitis (chronic) without hematuria: N30.10

## 2011-05-17 LAB — TROPONIN I: Troponin I: <0.3 ng/mL (ref <0.30)

## 2011-05-17 NOTE — ED Notes (Signed)
Patient transported to X-ray and back 

## 2011-05-17 NOTE — ED Provider Notes (Signed)
History    31yF with CP. L sided with radiation to LE. Worse with inspiration. Onset while sitting down and reading. Improved since onset. No sob. No fever or chills. No cough. No palpitations or diaphoresis. Did feel a little nauseated with symptom onset which has resolved. No hx of similar. Denies hx of blood clot. Denies drug use. No unusual leg pain or swelling. Denies use of ocps.   CSN: [id number]  Arrival date & time 05/17/11  1243   First MD Initiated Contact with Patient 05/17/11 1247      Chief Complaint  Patient presents with  . Chest Pain    (Consider location/radiation/quality/duration/timing/severity/associated sxs/prior treatment) HPI  Past Medical History  Diagnosis Date  . Interstitial cystitis     Past Surgical History  Procedure Date  . Ovarian cyst removal     No family history on file.  History  Substance Use Topics  . Smoking status: Never Smoker   . Smokeless tobacco: Not on file  . Alcohol Use: No    OB History    Grav Para Term Preterm Abortions TAB SAB Ect Mult Living                  Review of Systems   Review of symptoms negative unless otherwise noted in HPI.   Allergies  Review of patient's allergies indicates no known allergies.  Home Medications   Current Outpatient Rx  Name Route Sig Dispense Refill  . ERGOCALCIFEROL 50000 UNITS PO CAPS Oral Take 50,000 Units by mouth once a week.      BP 116/67  Pulse 74  Temp(Src) 98.6 F (37 C) (Oral)  Resp 14  Ht 5\' 2"  (1.575 m)  Wt 190 lb (86.183 kg)  BMI 34.75 kg/m2  SpO2 99%  Physical Exam  Nursing note and vitals reviewed. Constitutional: She appears well-developed and well-nourished. No distress.       Laying in bed. NAD. Obese.  HENT:  Head: Normocephalic and atraumatic.  Eyes: Conjunctivae are normal. Pupils are equal, round, and reactive to light. Right eye exhibits no discharge. Left eye exhibits no discharge.  Neck: Neck supple.  Cardiovascular: Normal rate,  regular rhythm and normal heart sounds.  Exam reveals no gallop and no friction rub.   No murmur heard. Pulmonary/Chest: Effort normal and breath sounds normal. No respiratory distress. She exhibits no tenderness.  Abdominal: Soft. She exhibits no distension. There is no tenderness.  Musculoskeletal: She exhibits no edema and no tenderness.       Lower extremities symmetric as compared to each other. No calf tenderness. Negative Homan's. No palpable cords.   Neurological: She is alert.  Skin: Skin is warm and dry. She is not diaphoretic.  Psychiatric: She has a normal mood and affect. Her behavior is normal. Thought content normal.    ED Course  Procedures (including critical care time)   Labs Reviewed  TROPONIN I   Dg Chest 2 View  05/17/2011  *RADIOLOGY REPORT*  Clinical Data: Chest pain.  CHEST - 2 VIEW  Comparison: PA and lateral chest 11/20/2009.  Findings: Lungs are clear.  Heart size is normal.  No pneumothorax or pleural effusion.  No focal bony abnormality.  IMPRESSION: Negative chest.  Original Report Authenticated By: Bernadene Bell. D'ALESSIO, M.D.   EKG:  Rhythm: nsr Rate: 76 Axis: normal Intervals:normal ST segments: normal Comparison: no significant change from 12/21/2009   1. Chest pain       MDM  31 year old female with chest pain. Atypical  for ACS given pleuritic nature. Patient has no significant risk factors. EKG is non-provocative and unchanged from prior. Initial troponin is normal over 5 hours after the onset of symptoms. Consider pulmonary embolism but doubt. Patient is not complaining of any dyspnea. Oxygen saturations are 100 percent on room air. Not tachycardic.  No exogenous estrogen use. Feel that safe for discharge at this time. Return precautions discussed. Outpt fu.        Raeford Razor, MD 05/17/11 [phone]

## 2011-05-17 NOTE — Discharge Instructions (Signed)
Chest Pain (Nonspecific) It is often hard to give a specific diagnosis for the cause of chest pain. There is always a chance that your pain could be related to something serious, such as a heart attack or a blood clot in the lungs. You need to follow up with your caregiver for further evaluation. CAUSES   Heartburn.   Pneumonia or bronchitis.   Anxiety or stress.   Inflammation around your heart (pericarditis) or lung (pleuritis or pleurisy).   A blood clot in the lung.   A collapsed lung (pneumothorax). It can develop suddenly on its own (spontaneous pneumothorax) or from injury (trauma) to the chest.   Shingles infection (herpes zoster virus).  The chest wall is composed of bones, muscles, and cartilage. Any of these can be the source of the pain.  The bones can be bruised by injury.   The muscles or cartilage can be strained by coughing or overwork.   The cartilage can be affected by inflammation and become sore (costochondritis).  DIAGNOSIS  Lab tests or other studies, such as X-rays, electrocardiography, stress testing, or cardiac imaging, may be needed to find the cause of your pain.  TREATMENT   Treatment depends on what may be causing your chest pain. Treatment may include:   Acid blockers for heartburn.   Anti-inflammatory medicine.   Pain medicine for inflammatory conditions.   Antibiotics if an infection is present.   You may be advised to change lifestyle habits. This includes stopping smoking and avoiding alcohol, caffeine, and chocolate.   You may be advised to keep your head raised (elevated) when sleeping. This reduces the chance of acid going backward from your stomach into your esophagus.   Most of the time, nonspecific chest pain will improve within 2 to 3 days with rest and mild pain medicine.  HOME CARE INSTRUCTIONS   If antibiotics were prescribed, take your antibiotics as directed. Finish them even if you start to feel better.   For the next few  days, avoid physical activities that bring on chest pain. Continue physical activities as directed.   Do not smoke.   Avoid drinking alcohol.   Only take over-the-counter or prescription medicine for pain, discomfort, or fever as directed by your caregiver.   Follow your caregiver's suggestions for further testing if your chest pain does not go away.   Keep any follow-up appointments you made. If you do not go to an appointment, you could develop lasting (chronic) problems with pain. If there is any problem keeping an appointment, you must call to reschedule.  SEEK MEDICAL CARE IF:   You think you are having problems from the medicine you are taking. Read your medicine instructions carefully.   Your chest pain does not go away, even after treatment.   You develop a rash with blisters on your chest.  SEEK IMMEDIATE MEDICAL CARE IF:   You have increased chest pain or pain that spreads to your arm, neck, jaw, back, or abdomen.   You develop shortness of breath, an increasing cough, or you are coughing up blood.   You have severe back or abdominal pain, feel nauseous, or vomit.   You develop severe weakness, fainting, or chills.   You have a fever.  THIS IS AN EMERGENCY. Do not wait to see if the pain will go away. Get medical help at once. Call your local emergency services (911 in [place] Do not drive yourself to the hospital. MAKE SURE YOU:   Understand these instructions.     Will watch your condition.   Will get help right away if you are not doing well or get worse.  Document Released: 10/25/2004 Document Revised: 01/04/2011 Document Reviewed: 08/21/2007 ExitCare Patient Information 2012 [employer].  RESOURCE GUIDE  Dental Problems  Patients with Medicaid: [facility]                     [facility] [address]                                           [address] Phone:  [phone]                                                   Phone:  [phone]  If unable to pay or uninsured, contact:  [facility] or [facility] to become qualified for the adult dental clinic.  Chronic Pain Problems Contact [facility]  [phone] Patients need to be referred by their primary care doctor.  Insufficient Money for Medicine Contact [employer]:  call "211" or [facility] 271-5999.  No Primary Care Doctor Call [facility]  832-8000 Other agencies that provide inexpensive medical care    [facility]  832-8035    [facility]  832-7272    [facility]  [phone]    [facility] Clinic  832-4777    [facility]  [phone]    [facility]  [phone]  Psychological Services [facility]  [phone] [facility]  [phone] [facility]   800 853-5163 (emergency services 641-4993)  Substance Abuse Resources [facility]  336-882-2125 [facility] 336-784-9470 [facility] 336-285-9073 [facility] 336-845-3988 Residential & Outpatient Substance Abuse Program  800-659-3381  Abuse/Neglect [place] Child Abuse Hotline (336) 641-3795 [place] Child Abuse Hotline 800-378-5315 (After Hours)  Emergency Shelter [facility] (336) 271-5985  [facility] (336) 275-9566 [facility] (704) 372-4663  MRSA Hotline #:   832-7006    [place] Resources  [facility] [place]     [employer]                          [facility] [address] [place]                       [address]      [address]  [place]                                                [place]                            [place] Phone:  [phone]                                     Phone:  [phone]                 Phone:  [phone]  [facility] Phone:   [phone]  [place] Child Abuse Hotline (336) 342-1394 (336) 342-3537 (After Hours)   

## 2011-05-17 NOTE — ED Notes (Signed)
NAD noted at time of d/c home 

## 2011-05-17 NOTE — ED Notes (Signed)
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  Pt also advises she became lightheaded and short of breath.  Pt left her class and went to [facility] where she received ASA and EKG. They called [facility].  EMS placed IV and gave NTG SL x 1.  Pt states pain decreased after NTG and O2.  22g cath placed R hand.

## 2011-05-17 NOTE — ED Notes (Signed)
Pt went to [facility] this morning after experiencing CP at home around 0900.  Pt describes pain as sharp and radiating down left arm.  Rates pain 9/10.  Pt also states she was nauseous but denies SOB or diaphoresis.

## 2011-05-17 NOTE — ED Notes (Signed)
MD at bedside. 

## 2012-02-13 ENCOUNTER — Encounter (HOSPITAL_BASED_OUTPATIENT_CLINIC_OR_DEPARTMENT_OTHER): Payer: Self-pay | Admitting: *Deleted

## 2012-02-13 ENCOUNTER — Emergency Department (HOSPITAL_BASED_OUTPATIENT_CLINIC_OR_DEPARTMENT_OTHER)
Admission: EM | Admit: 2012-02-13 | Discharge: 2012-02-13 | Disposition: A | Discharge status: Home / Self Care | Payer: BC Managed Care – PPO | Attending: Emergency Medicine | Admitting: Emergency Medicine

## 2012-02-13 ENCOUNTER — Emergency Department (HOSPITAL_BASED_OUTPATIENT_CLINIC_OR_DEPARTMENT_OTHER): Payer: BC Managed Care – PPO

## 2012-02-13 DIAGNOSIS — Z87448 Personal history of other diseases of urinary system: Secondary | ICD-10-CM | POA: Insufficient documentation

## 2012-02-13 DIAGNOSIS — Z8679 Personal history of other diseases of the circulatory system: Secondary | ICD-10-CM | POA: Insufficient documentation

## 2012-02-13 DIAGNOSIS — R0602 Shortness of breath: Secondary | ICD-10-CM | POA: Insufficient documentation

## 2012-02-13 DIAGNOSIS — R079 Chest pain, unspecified: Secondary | ICD-10-CM

## 2012-02-13 DIAGNOSIS — Z79899 Other long term (current) drug therapy: Secondary | ICD-10-CM | POA: Insufficient documentation

## 2012-02-13 DIAGNOSIS — R071 Chest pain on breathing: Secondary | ICD-10-CM | POA: Insufficient documentation

## 2012-02-13 HISTORY — DX: Tachycardia, unspecified: R00.0

## 2012-02-13 LAB — COMPREHENSIVE METABOLIC PANEL
ALT: 15 U/L (ref 0–35)
AST: 15 U/L (ref 0–37)
Albumin: 3.8 g/dL (ref 3.5–5.2)
Alkaline Phosphatase: 127 U/L — ABNORMAL HIGH (ref 39–117)
BUN: 9 mg/dL (ref 6–23)
CO2: 26 mEq/L (ref 19–32)
Calcium: 9.3 mg/dL (ref 8.4–10.5)
Chloride: 103 mEq/L (ref 96–112)
Creatinine, Ser: 0.8 mg/dL (ref 0.50–1.10)
GFR calc Af Amer: >90 mL/min (ref >90)
GFR calc non Af Amer: >90 mL/min (ref >90)
Glucose, Bld: 101 mg/dL — ABNORMAL HIGH (ref 70–99)
Potassium: 3.8 mEq/L (ref 3.5–5.1)
Sodium: 138 mEq/L (ref 135–145)
Total Bilirubin: 0.4 mg/dL (ref 0.3–1.2)
Total Protein: 7.8 g/dL (ref 6.0–8.3)

## 2012-02-13 LAB — TROPONIN I: Troponin I: <0.3 ng/mL (ref <0.30)

## 2012-02-13 LAB — LIPASE, BLOOD: Lipase: 64 U/L — ABNORMAL HIGH (ref 11–59)

## 2012-02-13 MED ORDER — OXYCODONE-ACETAMINOPHEN 5-325 MG PO TABS: 1.0000 | ORAL_TABLET | Freq: Four times a day (QID) | ORAL | Status: DC | PRN

## 2012-02-13 MED ORDER — OXYCODONE-ACETAMINOPHEN 5-325 MG PO TABS
1.0000 | ORAL_TABLET | Freq: Once | ORAL | Status: AC
  Administered 2012-02-13: 1 via ORAL
  Filled 2012-02-13 (×2): qty 1

## 2012-02-13 NOTE — ED Notes (Signed)
MD at bedside. 

## 2012-02-13 NOTE — ED Notes (Signed)
Patient states she developed central chest pain with radiation into her throat and jaw which started last night at 2300.  States she has had 2 similar episodes 1.5 months ago and 3 months ago and had a negative cardiac workup.  Describes pain as a pressure and is associated with nausea.

## 2012-02-13 NOTE — ED Provider Notes (Signed)
History     CSN: [id number]  Arrival date & time 02/13/12  1191   First MD Initiated Contact with Patient 02/13/12 [phone]      Chief Complaint  Patient presents with  . Chest Pain    (Consider location/radiation/quality/duration/timing/severity/associated sxs/prior treatment) Patient is a 32 y.o. female presenting with chest pain. The history is provided by the patient.  Chest Pain The chest pain began yesterday. Primary symptoms include shortness of breath. Pertinent negatives for primary symptoms include no abdominal pain, no nausea and no vomiting.  Pertinent negatives for associated symptoms include no numbness and no weakness.    patient developed pain in her upper chest going to the back and arms last night. She states she's had previous episodes like this. She states this is more severe in she's had nausea with it. No diarrhea. The vomiting. She states she has some is difficult breathing with it. No cough. No swelling of legs. She does not smoke. She is on a Mirena IUD. She's had previous workups for this. She states she had a grandfather that his first heart attack in his 30s.  Past Medical History  Diagnosis Date  . Interstitial cystitis   . Tachycardia     Past Surgical History  Procedure Date  . Ovarian cyst removal   . Appendectomy     No family history on file.  History  Substance Use Topics  . Smoking status: Never Smoker   . Smokeless tobacco: Not on file  . Alcohol Use: No    OB History    Grav Para Term Preterm Abortions TAB SAB Ect Mult Living                  Review of Systems  Constitutional: Negative for activity change and appetite change.  HENT: Negative for neck stiffness.   Eyes: Negative for pain.  Respiratory: Positive for shortness of breath. Negative for chest tightness.   Cardiovascular: Positive for chest pain. Negative for leg swelling.  Gastrointestinal: Negative for nausea, vomiting, abdominal pain and diarrhea.  Genitourinary:  Negative for flank pain.  Musculoskeletal: Negative for back pain.  Skin: Negative for rash.  Neurological: Negative for weakness, numbness and headaches.  Psychiatric/Behavioral: Negative for behavioral problems.    Allergies  Review of patient's allergies indicates no known allergies.  Home Medications   Current Outpatient Rx  Name  Route  Sig  Dispense  Refill  . CALCIUM CARBONATE ANTACID 500 MG PO CHEW   Oral   Chew 1 tablet by mouth daily.         Marland Kitchen RANITIDINE HCL 150 MG PO TABS   Oral   Take 150 mg by mouth 2 (two) times daily.         . ERGOCALCIFEROL 50000 UNITS PO CAPS   Oral   Take 50,000 Units by mouth once a week.         . OXYCODONE-ACETAMINOPHEN 5-325 MG PO TABS   Oral   Take 1-2 tablets by mouth every 6 (six) hours as needed for pain.   10 tablet   0     BP 124/74  Pulse 91  Temp 98.5 F (36.9 C) (Oral)  Resp 18  Ht 5\' 3"  (1.6 m)  Wt 190 lb (86.183 kg)  BMI 33.66 kg/m2  SpO2 100%  Physical Exam  Nursing note and vitals reviewed. Constitutional: She is oriented to person, place, and time. She appears well-developed and well-nourished.  HENT:  Head: Normocephalic and atraumatic.  Eyes: EOM  are normal. Pupils are equal, round, and reactive to light.  Neck: Normal range of motion. Neck supple.  Cardiovascular: Normal rate, regular rhythm and normal heart sounds.   No murmur heard. Pulmonary/Chest: Effort normal and breath sounds normal. No respiratory distress. She has no wheezes. She has no rales. She exhibits tenderness.       Tenderness to upper left chest wall. No crepitance or deformity.  Abdominal: Soft. Bowel sounds are normal. She exhibits no distension. There is tenderness. There is no rebound and no guarding.       Epigastric tenderness without rebound or guarding.  Musculoskeletal: Normal range of motion.  Neurological: She is alert and oriented to person, place, and time. No cranial nerve deficit.  Skin: Skin is warm and dry.    Psychiatric: She has a normal mood and affect. Her speech is normal.    ED Course  Procedures (including critical care time)  Labs Reviewed  COMPREHENSIVE METABOLIC PANEL - Abnormal; Notable for the following:    Glucose, Bld 101 (*)     Alkaline Phosphatase 127 (*)     All other components within normal limits  LIPASE, BLOOD - Abnormal; Notable for the following:    Lipase 64 (*)     All other components within normal limits  TROPONIN I   Dg Chest 2 View  02/13/2012  *RADIOLOGY REPORT*  Clinical Data: Chest pain  CHEST - 2 VIEW  Comparison: 05/17/2011  Findings: The lungs are clear without focal consolidation, edema, effusion or pneumothorax.  Cardiopericardial silhouette is within normal limits for size.  Imaged bony structures of the thorax are intact.  IMPRESSION: Normal exam.   Original Report Authenticated By: Kennith Center, M.D.      1. Chest pain      Date: 02/13/2012  Rate: 95  Rhythm: normal sinus rhythm  QRS Axis: normal  Intervals: normal  ST/T Wave abnormalities: normal  Conduction Disutrbances: none  Narrative Interpretation: unremarkable      MDM  Patient with recurrent chest pain. EKG lab work reassuring. X-ray normal. She's been previously worked up for PE. Lipase is barely above normal. Alkaline phosphatase elevated, but at her baseline. She'll followup as needed        Juliet Rude. Rubin Payor, MD 02/13/12 1230

## 2012-02-13 NOTE — ED Notes (Signed)
Pt reports pain increases in chest with palpation.  Pt was seen by PMD approx 1 month ago with chest pain, sent to [facility] for evaluation and she states cardiac workup was negative.  Pain is intermittently relieved after Tums and Zantac.  Pt did not follow up with PMD after ED visit.

## 2012-02-13 NOTE — ED Notes (Signed)
Patient changed into gown.

## 2012-02-14 ENCOUNTER — Other Ambulatory Visit: Payer: Self-pay | Admitting: Family Medicine

## 2012-02-14 DIAGNOSIS — R079 Chest pain, unspecified: Secondary | ICD-10-CM

## 2012-02-15 ENCOUNTER — Ambulatory Visit
Admission: RE | Admit: 2012-02-15 | Discharge: 2012-02-15 | Disposition: A | Discharge status: Home / Self Care | Payer: BC Managed Care – PPO | Source: Ambulatory Visit | Attending: Family Medicine | Admitting: Family Medicine

## 2012-02-15 DIAGNOSIS — R079 Chest pain, unspecified: Secondary | ICD-10-CM

## 2012-03-15 ENCOUNTER — Other Ambulatory Visit: Payer: Self-pay

## 2012-03-21 ENCOUNTER — Emergency Department (HOSPITAL_COMMUNITY)
Admission: EM | Admit: 2012-03-21 | Discharge: 2012-03-22 | Disposition: A | Discharge status: Home / Self Care | Payer: BC Managed Care – PPO | Attending: Emergency Medicine | Admitting: Emergency Medicine

## 2012-03-21 ENCOUNTER — Encounter (HOSPITAL_COMMUNITY): Payer: Self-pay | Admitting: Cardiology

## 2012-03-21 DIAGNOSIS — R110 Nausea: Secondary | ICD-10-CM | POA: Insufficient documentation

## 2012-03-21 DIAGNOSIS — Z87448 Personal history of other diseases of urinary system: Secondary | ICD-10-CM | POA: Insufficient documentation

## 2012-03-21 DIAGNOSIS — R079 Chest pain, unspecified: Secondary | ICD-10-CM | POA: Insufficient documentation

## 2012-03-21 DIAGNOSIS — Z86718 Personal history of other venous thrombosis and embolism: Secondary | ICD-10-CM | POA: Insufficient documentation

## 2012-03-21 DIAGNOSIS — Z8679 Personal history of other diseases of the circulatory system: Secondary | ICD-10-CM | POA: Insufficient documentation

## 2012-03-21 DIAGNOSIS — R791 Abnormal coagulation profile: Secondary | ICD-10-CM | POA: Insufficient documentation

## 2012-03-21 LAB — POCT I-STAT TROPONIN I: Troponin i, poc: 0.01 ng/mL (ref 0.00–0.08)

## 2012-03-21 MED ORDER — MORPHINE SULFATE 4 MG/ML IJ SOLN
4.0000 mg | Freq: Once | INTRAMUSCULAR | Status: AC
  Administered 2012-03-21: 4 mg via INTRAVENOUS
  Filled 2012-03-21: qty 1

## 2012-03-21 NOTE — ED Notes (Signed)
Dr Anne Fu phoned to say patient had a positive DDimer 0.6 in the office and was told to come to ED for CT scan of her lungs.

## 2012-03-21 NOTE — ED Notes (Signed)
Pt updated on delay & will continue to update when an available room is ready

## 2012-03-21 NOTE — ED Provider Notes (Signed)
History     CSN: [id number]  Arrival date & time 03/21/12  1859   First MD Initiated Contact with Patient 03/21/12 2214      Chief Complaint  Patient presents with  . Abnormal Lab     Patient is a 32 y.o. female presenting with chest pain. The history is provided by the patient.  Chest Pain Pain location:  L chest Pain quality: pressure   Radiates to: neck. Pain severity:  Moderate Onset quality:  Gradual Timing:  Intermittent Chronicity:  Recurrent Context: breathing   Relieved by:  Nothing Worsened by:  Deep breathing Associated symptoms: nausea   Associated symptoms: no weakness   pt reports she has had CP intermittently over the past month.  She has been seen by cardiology in referral by her PCP, and she was seen yesterday and had labs drawn.  She reports that today she was called to come in to the ED as her ddimer was elevated.  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  She no longer takes anticoagulation.  She reports that the cardiologist plan to evaluate her CP further later in the month as outpatient with stress testing  Past Medical History  Diagnosis Date  . Interstitial cystitis   . Tachycardia     Past Surgical History  Procedure Laterality Date  . Ovarian cyst removal    . Appendectomy      History reviewed. No pertinent family history.  History  Substance Use Topics  . Smoking status: Never Smoker   . Smokeless tobacco: Not on file  . Alcohol Use: No    OB History   Grav Para Term Preterm Abortions TAB SAB Ect Mult Living                  Review of Systems  Cardiovascular: Positive for chest pain.  Gastrointestinal: Positive for nausea.  Neurological: Negative for weakness.  All other systems reviewed and are negative.    Allergies  Review of patient's allergies indicates no known allergies.  Home Medications  No current outpatient prescriptions on file.  BP 132/78  Pulse 95  Temp(Src) 98.2 F (36.8 C) (Oral)  Resp 23   SpO2 99%  Physical Exam CONSTITUTIONAL: Well developed/well nourished HEAD AND FACE: Normocephalic/atraumatic EYES: EOMI/PERRL ENMT: Mucous membranes moist NECK: supple no meningeal signs CV: S1/S2 noted, no murmurs/rubs/gallops noted LUNGS: Lungs are clear to auscultation bilaterally, no apparent distress ABDOMEN: soft, nontender, no rebound or guarding NEURO: Pt is awake/alert, moves all extremitiesx4 EXTREMITIES: pulses normal, full ROM SKIN: warm, color normal PSYCH: no abnormalities of mood noted  ED Course  Procedures   Labs Reviewed  POCT I-STAT TROPONIN I   11:30 PM Pt sent in by her cardiologist for elevated ddimer Will obtain CT chest Pt reports she is not pregnant Labs reveal that last month her creatinine was normal  12:57 AM CT chest  Negative She is safe for f/u and to continue workup as outpatient for her CP  MDM  Nursing notes including past medical history and social history reviewed and considered in documentation Previous records reviewed and considered        Date: 03/21/2012  Rate: 90  Rhythm: normal sinus rhythm  QRS Axis: normal  Intervals: normal  ST/T Wave abnormalities: nonspecific ST changes  Conduction Disutrbances:none  Narrative Interpretation:   Old EKG Reviewed: unchanged    Joya Gaskins, MD 03/22/12 [phone]

## 2012-03-21 NOTE — ED Notes (Signed)
Pt reports she was seen at PCP office and had lab work done. States she was sent here to rule out a blood clot. Reports pain in the center of her chest, but denies any SOB.

## 2012-03-22 ENCOUNTER — Encounter (HOSPITAL_COMMUNITY): Payer: Self-pay | Admitting: Radiology

## 2012-03-22 ENCOUNTER — Emergency Department (HOSPITAL_COMMUNITY): Payer: BC Managed Care – PPO

## 2012-03-22 MED ORDER — IOHEXOL 350 MG/ML SOLN
100.0000 mL | Freq: Once | INTRAVENOUS | Status: AC | PRN
  Administered 2012-03-22: 100 mL via INTRAVENOUS

## 2012-03-22 MED ORDER — OXYCODONE-ACETAMINOPHEN 5-325 MG PO TABS: 1.0000 | ORAL_TABLET | ORAL | Status: DC | PRN

## 2012-03-24 ENCOUNTER — Other Ambulatory Visit: Payer: Self-pay | Admitting: Cardiology

## 2012-03-24 DIAGNOSIS — M79604 Pain in right leg: Secondary | ICD-10-CM

## 2012-03-25 ENCOUNTER — Ambulatory Visit
Admission: RE | Admit: 2012-03-25 | Discharge: 2012-03-25 | Disposition: A | Discharge status: Home / Self Care | Payer: BC Managed Care – PPO | Source: Ambulatory Visit | Attending: Cardiology | Admitting: Cardiology

## 2012-03-25 DIAGNOSIS — M79604 Pain in right leg: Secondary | ICD-10-CM

## 2012-03-25 DIAGNOSIS — M79605 Pain in left leg: Secondary | ICD-10-CM

## 2012-12-04 ENCOUNTER — Other Ambulatory Visit: Payer: Self-pay

## 2013-01-05 ENCOUNTER — Encounter: Payer: Self-pay | Admitting: Obstetrics & Gynecology

## 2013-04-05 ENCOUNTER — Emergency Department (HOSPITAL_BASED_OUTPATIENT_CLINIC_OR_DEPARTMENT_OTHER): Payer: BC Managed Care – PPO

## 2013-04-05 ENCOUNTER — Encounter (HOSPITAL_BASED_OUTPATIENT_CLINIC_OR_DEPARTMENT_OTHER): Payer: Self-pay | Admitting: Emergency Medicine

## 2013-04-05 ENCOUNTER — Emergency Department (HOSPITAL_BASED_OUTPATIENT_CLINIC_OR_DEPARTMENT_OTHER)
Admission: EM | Admit: 2013-04-05 | Discharge: 2013-04-05 | Disposition: A | Discharge status: Home / Self Care | Payer: BC Managed Care – PPO | Attending: Family Medicine | Admitting: Family Medicine

## 2013-04-05 DIAGNOSIS — R002 Palpitations: Secondary | ICD-10-CM | POA: Insufficient documentation

## 2013-04-05 DIAGNOSIS — K612 Anorectal abscess: Secondary | ICD-10-CM

## 2013-04-05 DIAGNOSIS — K611 Rectal abscess: Secondary | ICD-10-CM

## 2013-04-05 DIAGNOSIS — R000 Tachycardia, unspecified: Secondary | ICD-10-CM | POA: Insufficient documentation

## 2013-04-05 DIAGNOSIS — N3010 Interstitial cystitis (chronic) without hematuria: Secondary | ICD-10-CM | POA: Insufficient documentation

## 2013-04-05 LAB — CBC WITH DIFFERENTIAL/PLATELET
Basophils Absolute: 0 10*3/uL (ref 0.0–0.1)
Basophils Relative: 0 % (ref 0–1)
Eosinophils Absolute: 0.1 10*3/uL (ref 0.0–0.7)
Eosinophils Relative: 1 % (ref 0–5)
HCT: 37.5 % (ref 36.0–46.0)
Hemoglobin: 12.1 g/dL (ref 12.0–15.0)
Lymphocytes Relative: 16 % (ref 12–46)
Lymphs Abs: 1.3 10*3/uL (ref 0.7–4.0)
MCH: 25.9 pg — ABNORMAL LOW (ref 26.0–34.0)
MCHC: 32.3 g/dL (ref 30.0–36.0)
MCV: 80.1 fL (ref 78.0–100.0)
Monocytes Absolute: 0.8 10*3/uL (ref 0.1–1.0)
Monocytes Relative: 11 % (ref 3–12)
Neutro Abs: 5.7 10*3/uL (ref 1.7–7.7)
Neutrophils Relative %: 72 % (ref 43–77)
Platelets: 282 10*3/uL (ref 150–400)
RBC: 4.68 MIL/uL (ref 3.87–5.11)
RDW: 14.7 % (ref 11.5–15.5)
WBC: 7.9 10*3/uL (ref 4.0–10.5)

## 2013-04-05 LAB — BASIC METABOLIC PANEL
BUN: 9 mg/dL (ref 6–23)
CO2: 24 mEq/L (ref 19–32)
Calcium: 9.3 mg/dL (ref 8.4–10.5)
Chloride: 102 mEq/L (ref 96–112)
Creatinine, Ser: 0.8 mg/dL (ref 0.50–1.10)
GFR calc Af Amer: >90 mL/min (ref >90)
GFR calc non Af Amer: >90 mL/min (ref >90)
Glucose, Bld: 89 mg/dL (ref 70–99)
Potassium: 3.8 mEq/L (ref 3.7–5.3)
Sodium: 139 mEq/L (ref 137–147)

## 2013-04-05 LAB — PREGNANCY, URINE: Preg Test, Ur: NEGATIVE

## 2013-04-05 MED ORDER — OXYCODONE-ACETAMINOPHEN 5-325 MG PO TABS
1.0000 | ORAL_TABLET | Freq: Once | ORAL | Status: AC
  Administered 2013-04-05: 1 via ORAL
  Filled 2013-04-05: qty 1

## 2013-04-05 MED ORDER — HYDROMORPHONE HCL PF 1 MG/ML IJ SOLN
1.0000 mg | Freq: Once | INTRAMUSCULAR | Status: AC
  Administered 2013-04-05: 1 mg via INTRAVENOUS
  Filled 2013-04-05: qty 1

## 2013-04-05 MED ORDER — IOHEXOL 300 MG/ML  SOLN
100.0000 mL | Freq: Once | INTRAMUSCULAR | Status: AC | PRN
  Administered 2013-04-05: 100 mL via INTRAVENOUS

## 2013-04-05 MED ORDER — HYDROMORPHONE HCL PF 1 MG/ML IJ SOLN
1.0000 mg | Freq: Once | INTRAMUSCULAR | Status: DC
Start: 2013-04-05 — End: 2013-04-05

## 2013-04-05 NOTE — Consult Note (Signed)
Reason for Consult:rectal pain Referring Physician: Dr Alm Bustard is an 33 y.o. female.  HPI:  The pt is a 33 yo bf who has been having rectal pain since Thursday. It has gotten more swollen and tender. No fever. No drainage  Past Medical History  Diagnosis Date  . Interstitial cystitis   . Tachycardia     Past Surgical History  Procedure Laterality Date  . Ovarian cyst removal    . Appendectomy      No family history on file.  Social History:  reports that she has never smoked. She does not have any smokeless tobacco history on file. She reports that she does not drink alcohol or use illicit drugs.  Allergies: No Known Allergies  Medications: I have reviewed the patient's current medications.  Results for orders placed during the hospital encounter of 04/05/13 (from the past 48 hour(s))  CBC WITH DIFFERENTIAL     Status: Abnormal   Collection Time    04/05/13  2:05 PM      Result Value Ref Range   WBC 7.9  4.0 - 10.5 K/uL   RBC 4.68  3.87 - 5.11 MIL/uL   Hemoglobin 12.1  12.0 - 15.0 g/dL   HCT 37.5  36.0 - 46.0 %   MCV 80.1  78.0 - 100.0 fL   MCH 25.9 (*) 26.0 - 34.0 pg   MCHC 32.3  30.0 - 36.0 g/dL   RDW 14.7  11.5 - 15.5 %   Platelets 282  150 - 400 K/uL   Neutrophils Relative % 72  43 - 77 %   Neutro Abs 5.7  1.7 - 7.7 K/uL   Lymphocytes Relative 16  12 - 46 %   Lymphs Abs 1.3  0.7 - 4.0 K/uL   Monocytes Relative 11  3 - 12 %   Monocytes Absolute 0.8  0.1 - 1.0 K/uL   Eosinophils Relative 1  0 - 5 %   Eosinophils Absolute 0.1  0.0 - 0.7 K/uL   Basophils Relative 0  0 - 1 %   Basophils Absolute 0.0  0.0 - 0.1 K/uL  BASIC METABOLIC PANEL     Status: None   Collection Time    04/05/13  2:05 PM      Result Value Ref Range   Sodium 139  137 - 147 mEq/L   Potassium 3.8  3.7 - 5.3 mEq/L   Chloride 102  96 - 112 mEq/L   CO2 24  19 - 32 mEq/L   Glucose, Bld 89  70 - 99 mg/dL   BUN 9  6 - 23 mg/dL   Creatinine, Ser 0.80  0.50 - 1.10 mg/dL    Calcium 9.3  8.4 - 10.5 mg/dL   GFR calc non Af Amer >90  >90 mL/min   GFR calc Af Amer >90  >90 mL/min   Comment: (NOTE)     The eGFR has been calculated using the CKD EPI equation.     This calculation has not been validated in all clinical situations.     eGFR's persistently <90 mL/min signify possible Chronic Kidney     Disease.  PREGNANCY, URINE     Status: None   Collection Time    04/05/13  2:11 PM      Result Value Ref Range   Preg Test, Ur NEGATIVE  NEGATIVE   Comment:            THE SENSITIVITY OF THIS  METHODOLOGY IS >20 mIU/mL.    Ct Pelvis W Contrast  04/05/2013   CLINICAL DATA:  Rectal discomfort, possible perirectal abscess.  EXAM: CT PELVIS WITH CONTRAST  TECHNIQUE: Multidetector CT imaging of the pelvis was performed using the standard protocol following the bolus administration of intravenous contrast.  CONTRAST:  163m OMNIPAQUE IOHEXOL 300 MG/ML  SOLN  COMPARISON:  None.  FINDINGS: On images 22 through 345increased density is noted in the perirectal fat slightly to the left of midline. There is a well-marginated low-density structure measuring 2.2 cm AP x 1.3 cm transversely x 3.4 cm longitudinally which is consistent with a perirectal abscess. More proximally the rectum appears normal. The rectosigmoid colon also appears normal. On delayed images contrast appears in the urinary bladder.  There is an IUD present within the normal-appearing uterus. There are no adnexal masses and there is no free pelvic fluid. There is no inguinal hernia. There are few normal sized to borderline enlarged inguinal lymph nodes bilaterally. The bony pelvis exhibits no acute abnormalities.  IMPRESSION: There is an small perirectal fluid collection near the anus consistent with the clinically suspected abscess.   Electronically Signed   By: David  JMartinique  On: 04/05/2013 15:05    Review of Systems  Constitutional: Negative.   HENT: Negative.   Eyes: Negative.   Respiratory: Negative.    Cardiovascular: Negative.   Gastrointestinal: Negative.   Genitourinary: Negative.   Musculoskeletal: Negative.   Skin: Negative.   Neurological: Negative.   Endo/Heme/Allergies: Negative.   Psychiatric/Behavioral: Negative.    Blood pressure 126/79, pulse 114, temperature 98.6 F (37 C), temperature source Oral, resp. rate 18, last menstrual period 03/29/2013, SpO2 100.00%. Physical Exam  Constitutional: She is oriented to person, place, and time. She appears well-developed and well-nourished.  HENT:  Head: Normocephalic and atraumatic.  Eyes: Conjunctivae and EOM are normal. Pupils are equal, round, and reactive to light.  Neck: Normal range of motion. Neck supple.  Cardiovascular: Normal rate, regular rhythm and normal heart sounds.   Respiratory: Effort normal and breath sounds normal.  GI: Soft. Bowel sounds are normal.  Genitourinary:  There is a perirectal abscess with swelling and fluctuance in the right posterior perirectal space  Musculoskeletal: Normal range of motion.  Neurological: She is alert and oriented to person, place, and time.  Skin: Skin is warm and dry.  Psychiatric: She has a normal mood and affect. Her behavior is normal.    Assessment/Plan: The pt has a perirectal abscess. I incised and drained it in the ER. She was instructed on dressing changes. She will need to follow up in a week or two  TOTH III,Keamber Macfadden S 04/05/2013, 5:31 PM

## 2013-04-05 NOTE — Discharge Instructions (Signed)
Peri-Rectal Abscess °Your caregiver has diagnosed you as having a peri-rectal abscess. This is an infected area near the rectum that is filled with pus. If the abscess is near the surface of the skin, your caregiver may open (incise) the area and drain the pus. °HOME CARE INSTRUCTIONS  °· If your abscess was opened up and drained. A small piece of gauze may be placed in the opening so that it can drain. Do not remove the gauze unless directed by your caregiver. °· A loose dressing may be placed over the abscess site. Change the dressing as often as necessary to keep it clean and dry. °· After the drain is removed, the area may be washed with a gentle antiseptic (soap) four times per day. °· A warm sitz bath, warm packs or heating pad may be used for pain relief, taking care not to burn yourself. °· Return for a wound check in 1 day or as directed. °· An "inflatable doughnut" may be used for sitting with added comfort. These can be purchased at a drugstore or medical supply house. °· To reduce pain and straining with bowel movements, eat a high fiber diet with plenty of fruits and vegetables. Use stool softeners as recommended by your caregiver. This is especially important if narcotic type pain medications were prescribed as these may cause marked constipation. °· Only take over-the-counter or prescription medicines for pain, discomfort, or fever as directed by your caregiver. °SEEK IMMEDIATE MEDICAL CARE IF:  °· You have increasing pain that is not controlled by medication. °· There is increased inflammation (redness), swelling, bleeding, or drainage from the area. °· An oral temperature above 102° F (38.9° C) develops. °· You develop chills or generalized malaise (feel lethargic or feel "washed out"). °· You develop any new symptoms (problems) you feel may be related to your present problem. °Document Released: 01/13/2000 Document Revised: 04/09/2011 Document Reviewed: 01/13/2008 °[employer]® Patient Information  ©2014 [employer]. ° °

## 2013-04-05 NOTE — ED Notes (Signed)
[facility]/o an abcess. Dr Carolynne Edouardoth to see pt here.

## 2013-04-05 NOTE — Procedures (Signed)
Incision and Drainage Procedure Note  Pre-operative Diagnosis: perirectal abscess  Post-operative Diagnosis: same  Indications: perirectal abscess  Anesthesia: 1% lidocaine with epinephrine  Procedure Details  The procedure, risks and complications have been discussed in detail (including, but not limited to airway compromise, infection, bleeding) with the patient, and the patient has signed consent to the procedure.  The skin was sterilely prepped and draped over the affected area in the usual fashion. After adequate local anesthesia, I&D with a #11 blade was performed on the perirectal abscess . Purulent drainage: present The patient was observed until stable.  Findings: Perirectal abscess  EBL: 5 cc's  Drains: none  Condition: Tolerated procedure well   Complications: none.

## 2013-04-05 NOTE — ED Notes (Signed)
Dr. Carolynne Edouardoth notified that patient is here he will be seeing her shortly.

## 2013-04-05 NOTE — ED Provider Notes (Addendum)
Patient arrived here to see Dr. Carolynne Edouardoth. She complains of rectal pain. She requests something for pain. Dilaudid 1 mg IV ordered by me. Patient is alert ambulatory appears uncomfortable.  Doug SouSam Mehtaab Mayeda, MD 04/05/13 1636 Dr.Toth provided patient with discharge instructions verbally. I provided written instructions. She is to followup at his office. He provided prescriptions  Doug SouSam Tzion Wedel, MD 04/05/13 2326

## 2013-04-05 NOTE — ED Provider Notes (Signed)
CSN: [id number]     Arrival date & time 04/05/13  1138 History   First MD Initiated Contact with Patient 04/05/13 1252     Chief Complaint  Patient presents with  . Abscess     (Consider location/radiation/quality/duration/timing/severity/associated sxs/prior Treatment) Patient is a 33 y.o. female presenting with abscess. The history is provided by the patient.  Abscess Location:  Ano-genital Ano-genital abscess location:  Gluteal cleft Abscess quality: fluctuance, painful, redness and warmth   Red streaking: no   Duration:  2 days Progression:  Worsening Pain details:    Quality:  Throbbing   Timing:  Constant   Progression:  Worsening Chronicity:  New Context: not diabetes, not immunosuppression and not skin injury   Relieved by:  Nothing Exacerbated by: sitting on the area. Ineffective treatments:  None tried  Laura Shah is a 33 y.o. female who presents to the ED with a raised tender area in the gluteal fold near the anus. The pain has become severe over the past 24 hours. She is scheduled to leave tomorrow for a trip and concerned about the pain.   Past Medical History  Diagnosis Date  . Interstitial cystitis   . Tachycardia    Past Surgical History  Procedure Laterality Date  . Ovarian cyst removal    . Appendectomy     No family history on file. History  Substance Use Topics  . Smoking status: Never Smoker   . Smokeless tobacco: Not on file  . Alcohol Use: No   OB History   Grav Para Term Preterm Abortions TAB SAB Ect Mult Living                 Review of Systems Negative except as stated in HPI   Allergies  Review of patient's allergies indicates no known allergies.  Home Medications  No current outpatient prescriptions on file. BP 134/83  Pulse 112  Temp(Src) 98.9 F (37.2 C) (Oral)  Resp 18  SpO2 100% Physical Exam  Nursing note and vitals reviewed. Constitutional: She is oriented to person, place, and time. She appears  well-developed and well-nourished.  HENT:  Head: Normocephalic.  Eyes: EOM are normal.  Neck: Neck supple.  Cardiovascular: Tachycardia present.   Pulmonary/Chest: Effort normal.  Genitourinary:     Tender, swollen, fluctuant area palpated. Tender on rectal exam.   Musculoskeletal: Normal range of motion.  Neurological: She is alert and oriented to person, place, and time. No cranial nerve deficit.  Skin: Skin is warm and dry.  Psychiatric: She has a normal mood and affect. Her behavior is normal.    ED Course: Dr. Fredderick PhenixBelfi in to examine the pateint.  Procedures  Ct Pelvis W Contrast  04/05/2013   CLINICAL DATA:  Rectal discomfort, possible perirectal abscess.  EXAM: CT PELVIS WITH CONTRAST  TECHNIQUE: Multidetector CT imaging of the pelvis was performed using the standard protocol following the bolus administration of intravenous contrast.  CONTRAST:  100mL OMNIPAQUE IOHEXOL 300 MG/ML  SOLN  COMPARISON:  None.  FINDINGS: On images 22 through 1639 increased density is noted in the perirectal fat slightly to the left of midline. There is a well-marginated low-density structure measuring 2.2 cm AP x 1.3 cm transversely x 3.4 cm longitudinally which is consistent with a perirectal abscess. More proximally the rectum appears normal. The rectosigmoid colon also appears normal. On delayed images contrast appears in the urinary bladder.  There is an IUD present within the normal-appearing uterus. There are no adnexal masses and there  is no free pelvic fluid. There is no inguinal hernia. There are few normal sized to borderline enlarged inguinal lymph nodes bilaterally. The bony pelvis exhibits no acute abnormalities.  IMPRESSION: There is an small perirectal fluid collection near the anus consistent with the clinically suspected abscess.   Electronically Signed   By: David  [place]   On: 04/05/2013 15:05   Results for orders placed during the hospital encounter of 04/05/13 (from the past 24 hour(s))  CBC  WITH DIFFERENTIAL     Status: Abnormal   Collection Time    04/05/13  2:05 PM      Result Value Ref Range   WBC 7.9  4.0 - 10.5 K/uL   RBC 4.68  3.87 - 5.11 MIL/uL   Hemoglobin 12.1  12.0 - 15.0 g/dL   HCT 16.1  09.6 - 04.5 %   MCV 80.1  78.0 - 100.0 fL   MCH 25.9 (*) 26.0 - 34.0 pg   MCHC 32.3  30.0 - 36.0 g/dL   RDW 40.9  81.1 - 91.4 %   Platelets 282  150 - 400 K/uL   Neutrophils Relative % 72  43 - 77 %   Neutro Abs 5.7  1.7 - 7.7 K/uL   Lymphocytes Relative 16  12 - 46 %   Lymphs Abs 1.3  0.7 - 4.0 K/uL   Monocytes Relative 11  3 - 12 %   Monocytes Absolute 0.8  0.1 - 1.0 K/uL   Eosinophils Relative 1  0 - 5 %   Eosinophils Absolute 0.1  0.0 - 0.7 K/uL   Basophils Relative 0  0 - 1 %   Basophils Absolute 0.0  0.0 - 0.1 K/uL  BASIC METABOLIC PANEL     Status: None   Collection Time    04/05/13  2:05 PM      Result Value Ref Range   Sodium 139  137 - 147 mEq/L   Potassium 3.8  3.7 - 5.3 mEq/L   Chloride 102  96 - 112 mEq/L   CO2 24  19 - 32 mEq/L   Glucose, Bld 89  70 - 99 mg/dL   BUN 9  6 - 23 mg/dL   Creatinine, Ser 7.82  0.50 - 1.10 mg/dL   Calcium 9.3  8.4 - 95.6 mg/dL   GFR calc non Af Amer >90  >90 mL/min   GFR calc Af Amer >90  >90 mL/min  PREGNANCY, URINE     Status: None   Collection Time    04/05/13  2:11 PM      Result Value Ref Range   Preg Test, Ur NEGATIVE  NEGATIVE    MDM: consult with Dr. Carolynne Edouard  33 y.o. female with peri rectal abscess. Discussed with the patient option given by Dr. Carolynne Edouard that she could come to the office in the morning for I&D or she could come to [facility] ED but they were very busy. Patient states that she will go to [facility] because she is leaving at 3 am for her trip. I discussed with the patient that she may not feel like making a trip after the I&D but she states she is planning to go. She will go to [facility] ED now. Stable for discharge via private car with her husband driving.     [place], [place] 04/05/13 [phone]

## 2013-04-05 NOTE — ED Notes (Signed)
I & D tray is at the bedside set up and ready for the doctor to use. 

## 2013-04-06 NOTE — ED Provider Notes (Signed)
Medical screening examination/treatment/procedure(s) were conducted as a shared visit with non-physician practitioner(s) and myself.  I personally evaluated the patient during the encounter.   EKG Interpretation None      Pt with fullness/fluctuance to gluteal cleft.  CT shows perirectal abscess.  Surgery consulted  Rolan BuccoMelanie Madisynn Plair, MD 04/06/13 [phone]

## 2013-04-07 ENCOUNTER — Telehealth (INDEPENDENT_AMBULATORY_CARE_PROVIDER_SITE_OTHER): Payer: Self-pay

## 2013-04-07 NOTE — Telephone Encounter (Signed)
Left appt info on VM 

## 2013-04-13 ENCOUNTER — Encounter (INDEPENDENT_AMBULATORY_CARE_PROVIDER_SITE_OTHER): Payer: BC Managed Care – PPO | Admitting: General Surgery

## 2013-04-16 ENCOUNTER — Encounter (INDEPENDENT_AMBULATORY_CARE_PROVIDER_SITE_OTHER): Payer: Self-pay | Admitting: General Surgery

## 2013-05-05 ENCOUNTER — Other Ambulatory Visit (HOSPITAL_COMMUNITY)
Admission: RE | Admit: 2013-05-05 | Discharge: 2013-05-05 | Disposition: A | Discharge status: Home / Self Care | Payer: BC Managed Care – PPO | Source: Ambulatory Visit | Attending: Family Medicine | Admitting: Family Medicine

## 2013-05-05 ENCOUNTER — Other Ambulatory Visit: Payer: Self-pay | Admitting: Family Medicine

## 2013-05-05 DIAGNOSIS — Z1151 Encounter for screening for human papillomavirus (HPV): Secondary | ICD-10-CM | POA: Insufficient documentation

## 2013-05-05 DIAGNOSIS — Z124 Encounter for screening for malignant neoplasm of cervix: Secondary | ICD-10-CM | POA: Insufficient documentation

## 2013-05-05 DIAGNOSIS — Z113 Encounter for screening for infections with a predominantly sexual mode of transmission: Secondary | ICD-10-CM | POA: Insufficient documentation

## 2013-06-15 ENCOUNTER — Encounter (INDEPENDENT_AMBULATORY_CARE_PROVIDER_SITE_OTHER): Payer: BC Managed Care – PPO | Admitting: General Surgery

## 2013-07-10 ENCOUNTER — Encounter (INDEPENDENT_AMBULATORY_CARE_PROVIDER_SITE_OTHER): Payer: BC Managed Care – PPO | Admitting: General Surgery

## 2013-07-16 ENCOUNTER — Encounter (INDEPENDENT_AMBULATORY_CARE_PROVIDER_SITE_OTHER): Payer: Self-pay | Admitting: General Surgery

## 2013-08-24 ENCOUNTER — Encounter: Payer: Self-pay | Admitting: Cardiology

## 2013-08-24 DIAGNOSIS — R000 Tachycardia, unspecified: Secondary | ICD-10-CM | POA: Insufficient documentation

## 2013-08-24 DIAGNOSIS — N3010 Interstitial cystitis (chronic) without hematuria: Secondary | ICD-10-CM | POA: Insufficient documentation

## 2014-01-25 ENCOUNTER — Encounter: Payer: Self-pay | Admitting: *Deleted

## 2014-03-16 ENCOUNTER — Emergency Department (HOSPITAL_BASED_OUTPATIENT_CLINIC_OR_DEPARTMENT_OTHER)
Admission: EM | Admit: 2014-03-16 | Discharge: 2014-03-17 | Disposition: A | Discharge status: Home / Self Care | Payer: BC Managed Care – PPO | Attending: Emergency Medicine | Admitting: Emergency Medicine

## 2014-03-16 ENCOUNTER — Encounter (HOSPITAL_BASED_OUTPATIENT_CLINIC_OR_DEPARTMENT_OTHER): Payer: Self-pay | Admitting: *Deleted

## 2014-03-16 ENCOUNTER — Emergency Department (HOSPITAL_BASED_OUTPATIENT_CLINIC_OR_DEPARTMENT_OTHER): Payer: BC Managed Care – PPO

## 2014-03-16 DIAGNOSIS — Z87448 Personal history of other diseases of urinary system: Secondary | ICD-10-CM | POA: Insufficient documentation

## 2014-03-16 DIAGNOSIS — K859 Acute pancreatitis, unspecified: Secondary | ICD-10-CM | POA: Diagnosis not present

## 2014-03-16 DIAGNOSIS — R0602 Shortness of breath: Secondary | ICD-10-CM | POA: Diagnosis present

## 2014-03-16 DIAGNOSIS — Z79899 Other long term (current) drug therapy: Secondary | ICD-10-CM | POA: Insufficient documentation

## 2014-03-16 DIAGNOSIS — Z3202 Encounter for pregnancy test, result negative: Secondary | ICD-10-CM | POA: Diagnosis not present

## 2014-03-16 DIAGNOSIS — Z8679 Personal history of other diseases of the circulatory system: Secondary | ICD-10-CM | POA: Diagnosis not present

## 2014-03-16 HISTORY — DX: Endocarditis, valve unspecified: I38

## 2014-03-16 LAB — CBC WITH DIFFERENTIAL/PLATELET
Basophils Absolute: 0 10*3/uL (ref 0.0–0.1)
Basophils Relative: 0 % (ref 0–1)
Eosinophils Absolute: 0.2 10*3/uL (ref 0.0–0.7)
Eosinophils Relative: 2 % (ref 0–5)
HCT: 37.8 % (ref 36.0–46.0)
Hemoglobin: 12.1 g/dL (ref 12.0–15.0)
Lymphocytes Relative: 38 % (ref 12–46)
Lymphs Abs: 2.5 10*3/uL (ref 0.7–4.0)
MCH: 25.6 pg — ABNORMAL LOW (ref 26.0–34.0)
MCHC: 32 g/dL (ref 30.0–36.0)
MCV: 80.1 fL (ref 78.0–100.0)
Monocytes Absolute: 0.8 10*3/uL (ref 0.1–1.0)
Monocytes Relative: 13 % — ABNORMAL HIGH (ref 3–12)
Neutro Abs: 3.1 10*3/uL (ref 1.7–7.7)
Neutrophils Relative %: 47 % (ref 43–77)
Platelets: 318 10*3/uL (ref 150–400)
RBC: 4.72 MIL/uL (ref 3.87–5.11)
RDW: 14.8 % (ref 11.5–15.5)
WBC: 6.6 10*3/uL (ref 4.0–10.5)

## 2014-03-16 LAB — URINALYSIS, ROUTINE W REFLEX MICROSCOPIC
Bilirubin Urine: NEGATIVE
Glucose, UA: NEGATIVE mg/dL
Hgb urine dipstick: NEGATIVE
Ketones, ur: NEGATIVE mg/dL
Leukocytes, UA: NEGATIVE
Nitrite: NEGATIVE
Protein, ur: NEGATIVE mg/dL
Specific Gravity, Urine: 1.012 (ref 1.005–1.030)
Urobilinogen, UA: 1 mg/dL (ref 0.0–1.0)
pH: 6.5 (ref 5.0–8.0)

## 2014-03-16 LAB — PREGNANCY, URINE: Preg Test, Ur: NEGATIVE

## 2014-03-16 LAB — COMPREHENSIVE METABOLIC PANEL
ALT: 15 U/L (ref 0–35)
AST: 22 U/L (ref 0–37)
Albumin: 4.1 g/dL (ref 3.5–5.2)
Alkaline Phosphatase: 119 U/L — ABNORMAL HIGH (ref 39–117)
Anion gap: 4 — ABNORMAL LOW (ref 5–15)
BUN: 8 mg/dL (ref 6–23)
CO2: 27 mmol/L (ref 19–32)
Calcium: 8.8 mg/dL (ref 8.4–10.5)
Chloride: 107 mmol/L (ref 96–112)
Creatinine, Ser: 0.78 mg/dL (ref 0.50–1.10)
GFR calc Af Amer: >90 mL/min (ref >90)
GFR calc non Af Amer: >90 mL/min (ref >90)
Glucose, Bld: 97 mg/dL (ref 70–99)
Potassium: 3.5 mmol/L (ref 3.5–5.1)
Sodium: 138 mmol/L (ref 135–145)
Total Bilirubin: 0.3 mg/dL (ref 0.3–1.2)
Total Protein: 8.1 g/dL (ref 6.0–8.3)

## 2014-03-16 LAB — LIPASE, BLOOD: Lipase: 60 U/L — ABNORMAL HIGH (ref 11–59)

## 2014-03-16 LAB — TROPONIN I: Troponin I: <0.03 ng/mL (ref <0.031)

## 2014-03-16 MED ORDER — ONDANSETRON HCL 4 MG/2ML IJ SOLN
4.0000 mg | Freq: Once | INTRAMUSCULAR | Status: AC
  Administered 2014-03-16: 4 mg via INTRAVENOUS
  Filled 2014-03-16: qty 2

## 2014-03-16 MED ORDER — GI COCKTAIL ~~LOC~~
30.0000 mL | Freq: Once | ORAL | Status: AC
  Administered 2014-03-16: 30 mL via ORAL
  Filled 2014-03-16: qty 30

## 2014-03-16 MED ORDER — FENTANYL CITRATE 0.05 MG/ML IJ SOLN
50.0000 ug | Freq: Once | INTRAMUSCULAR | Status: AC
  Administered 2014-03-16: 50 ug via INTRAVENOUS
  Filled 2014-03-16: qty 2

## 2014-03-16 MED ORDER — SODIUM CHLORIDE 0.9 % IV BOLUS (SEPSIS)
1000.0000 mL | Freq: Once | INTRAVENOUS | Status: AC
  Administered 2014-03-16: 1000 mL via INTRAVENOUS

## 2014-03-16 MED ORDER — HYDROMORPHONE HCL 1 MG/ML IJ SOLN
1.0000 mg | Freq: Once | INTRAMUSCULAR | Status: AC
  Administered 2014-03-16: 1 mg via INTRAVENOUS
  Filled 2014-03-16: qty 1

## 2014-03-16 MED ORDER — SODIUM CHLORIDE 0.9 % IV BOLUS (SEPSIS)
1000.0000 mL | Freq: Once | INTRAVENOUS | Status: AC
  Administered 2014-03-17: 1000 mL via INTRAVENOUS

## 2014-03-16 MED ORDER — HYDROMORPHONE HCL 1 MG/ML IJ SOLN
1.0000 mg | Freq: Once | INTRAMUSCULAR | Status: AC
  Administered 2014-03-17: 1 mg via INTRAVENOUS
  Filled 2014-03-16: qty 1

## 2014-03-16 NOTE — ED Notes (Signed)
Patient states developed a sudden sob and heart fluttering about 90 minutes pta.  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

## 2014-03-16 NOTE — ED Provider Notes (Signed)
CSN: [id number]     Arrival date & time 03/16/14  [id number] History   This chart was scribed for Gerhard Munchobert Minetta Krisher, MD by Freida Busmaniana Omoyeni, ED Scribe. This patient was seen in room MH05/MH05 and the patient's care was started 8:03 PM.   Chief Complaint  Patient presents with  . Shortness of Breath    The history is provided by the patient. No language interpreter was used.     HPI Comments:  Foye Deeraishika S Karas is a 34 y.o. female who presents to the Emergency Department complaining of palpitations with central chest tightness that started this afternoon while at a math workshop. She reports associated lightheadedness, generalized weakness, and mild SOB. She states she felt fine prior to symptom onset. She denies vomiting, LOC, and visual changes. No alleviating factors noted.   Past Medical History  Diagnosis Date  . Interstitial cystitis   . Tachycardia   . Heart valve regurgitation    Past Surgical History  Procedure Laterality Date  . Ovarian cyst removal    . Appendectomy     No family history on file. History  Substance Use Topics  . Smoking status: Never Smoker   . Smokeless tobacco: Not on file  . Alcohol Use: No   OB History    No data available     Review of Systems  Constitutional: Negative for fever.  Eyes: Negative for visual disturbance.  Respiratory: Positive for shortness of breath.   Cardiovascular: Positive for palpitations.  Gastrointestinal: Negative for vomiting.  Neurological: Positive for light-headedness.  All other systems reviewed and are negative.     Allergies  Review of patient's allergies indicates no known allergies.  Home Medications   Prior to Admission medications   Medication Sig Start Date End Date Taking? Authorizing Provider  levonorgestrel (MIRENA) 20 MCG/24HR IUD 1 each by Intrauterine route once.   Yes Historical Provider, MD  citalopram (CELEXA) 20 MG tablet Take 1 tablet by mouth daily. 07/02/13   Historical Provider, MD   oxyCODONE-acetaminophen (PERCOCET/ROXICET) 5-325 MG per tablet Take 1 tablet by mouth once.    Historical Provider, MD   BP 143/92 mmHg  Pulse 114  Temp(Src) 97.8 F (36.6 C) (Oral)  Resp 22  Ht 5\' 3"  (1.6 m)  Wt 190 lb (86.183 kg)  BMI 33.67 kg/m2  SpO2 100% Physical Exam  Constitutional: She is oriented to person, place, and time. She appears well-developed and well-nourished. No distress.  HENT:  Head: Normocephalic and atraumatic.  Eyes: Conjunctivae and EOM are normal.  Neck: Normal range of motion.  Cardiovascular: Normal rate and regular rhythm.   Pulmonary/Chest: Effort normal and breath sounds normal. No stridor. No respiratory distress.  Abdominal: She exhibits no distension. There is tenderness (Epigastrum).  Musculoskeletal: Normal range of motion. She exhibits no edema.  Neurological: She is alert and oriented to person, place, and time. No cranial nerve deficit.  Skin: Skin is warm and dry.  Psychiatric: She has a normal mood and affect.  Nursing note and vitals reviewed.   ED Course  Procedures   DIAGNOSTIC STUDIES:  Oxygen Saturation is 100% on RA, normal by my interpretation.    COORDINATION OF CARE:  8:08 PM Discussed treatment plan with pt at bedside and pt agreed to plan.  Labs Review Labs Reviewed  COMPREHENSIVE METABOLIC PANEL - Abnormal; Notable for the following:    Alkaline Phosphatase 119 (*)    Anion gap 4 (*)    All other components within normal limits  CBC WITH  DIFFERENTIAL/PLATELET - Abnormal; Notable for the following:    MCH 25.6 (*)    Monocytes Relative 13 (*)    All other components within normal limits  LIPASE, BLOOD - Abnormal; Notable for the following:    Lipase 60 (*)    All other components within normal limits  URINALYSIS, ROUTINE W REFLEX MICROSCOPIC  PREGNANCY, URINE  TROPONIN I    Imaging Review Dg Chest 2 View  03/16/2014   CLINICAL DATA:  Shortness of breath and palpitations with chest pain  EXAM: CHEST  2  VIEW  COMPARISON:  None.  FINDINGS: The heart size and mediastinal contours are within normal limits. Both lungs are clear. The visualized skeletal structures are unremarkable.  IMPRESSION: No active cardiopulmonary disease.   Electronically Signed   By: Alcide Clever M.D.   On: 03/16/2014 20:39     EKG Interpretation   Date/Time:  Tuesday March 16 2014 18:43:19 EST Ventricular Rate:  98 PR Interval:  146 QRS Duration: 84 QT Interval:  332 QTC Calculation: 423 R Axis:   10 Text Interpretation:  Normal sinus rhythm Nonspecific T wave abnormality  Abnormal ECG Sinus rhythm T wave abnormality Artifact Abnormal ekg  Confirmed by Gerhard Munch  MD ([id number]) on 03/16/2014 6:46:32 PM     9:28 PM Patient aware of all results.  She continued to complain of burning in her chest. Additional meds ordered.  12:48 AM Patient w substantially reduced pain. She voiced an understanding of all results, follow-up instructions. MDM   Patient presents with epigastric discomfort, some dyspnea, palpitations. Patient's found have elevated lipase.  On repeat exam the patient also describes nausea, anorexia recently. Patient received 2 L fluid, with substantial reduction in symptoms here. No evidence for peritonitis, nor other acute pathology. Patient will follow-up with gastroenterology.   I personally performed the services described in this documentation, which was scribed in my presence. The recorded information has been reviewed and is accurate.     Gerhard Munch, MD 03/17/14 [phone]

## 2014-03-17 MED ORDER — ONDANSETRON 4 MG PO TBDP: 4.0000 mg | ORAL_TABLET | Freq: Three times a day (TID) | ORAL | Status: DC | PRN

## 2014-03-17 MED ORDER — DIPHENHYDRAMINE HCL 50 MG/ML IJ SOLN
INTRAMUSCULAR | Status: AC
  Filled 2014-03-17: qty 1

## 2014-03-17 MED ORDER — OXYCODONE-ACETAMINOPHEN 5-325 MG PO TABS: 1.0000 | ORAL_TABLET | Freq: Three times a day (TID) | ORAL | Status: DC | PRN

## 2014-03-17 MED ORDER — DIPHENHYDRAMINE HCL 50 MG/ML IJ SOLN
12.5000 mg | Freq: Once | INTRAMUSCULAR | Status: AC
  Administered 2014-03-17: 12.5 mg via INTRAVENOUS

## 2014-03-17 NOTE — Discharge Instructions (Signed)
As discussed, your evaluation tonight suggests that your pancreas is irritated.  It is important for the next 2 days you maintain a clear liquid diet.  Subsequently, you can slowly begin moving towards a normal diet.  Equal important is that she follow-up with a gastroenterologist for further evaluation of this episode.  Return here for concerning changes in your condition.  Acute Pancreatitis Acute pancreatitis is a disease in which the pancreas becomes suddenly inflamed. The pancreas is a large gland located behind your stomach. The pancreas produces enzymes that help digest food. The pancreas also releases the hormones glucagon and insulin that help regulate blood sugar. Damage to the pancreas occurs when the digestive enzymes from the pancreas are activated and begin attacking the pancreas before being released into the intestine. Most acute attacks last a couple of days and can cause serious complications. Some people become dehydrated and develop low blood pressure. In severe cases, bleeding into the pancreas can lead to shock and can be life-threatening. The lungs, heart, and kidneys may fail. CAUSES  Pancreatitis can happen to anyone. In some cases, the cause is unknown. Most cases are caused by:  Alcohol abuse.  Gallstones. Other less common causes are:  Certain medicines.  Exposure to certain chemicals.  Infection.  Damage caused by an accident (trauma).  Abdominal surgery. SYMPTOMS   Pain in the upper abdomen that may radiate to the back.  Tenderness and swelling of the abdomen.  Nausea and vomiting. DIAGNOSIS  Your caregiver will perform a physical exam. Blood and stool tests may be done to confirm the diagnosis. Imaging tests may also be done, such as X-rays, CT scans, or an ultrasound of the abdomen. TREATMENT  Treatment usually requires a stay in the hospital. Treatment may include:  Pain medicine.  Fluid replacement through an intravenous line  (IV).  Placing a tube in the stomach to remove stomach contents and control vomiting.  Not eating for 3 or 4 days. This gives your pancreas a rest, because enzymes are not being produced that can cause further damage.  Antibiotic medicines if your condition is caused by an infection.  Surgery of the pancreas or gallbladder. HOME CARE INSTRUCTIONS   Follow the diet advised by your caregiver. This may involve avoiding alcohol and decreasing the amount of fat in your diet.  Eat smaller, more frequent meals. This reduces the amount of digestive juices the pancreas produces.  Drink enough fluids to keep your urine clear or pale yellow.  Only take over-the-counter or prescription medicines as directed by your caregiver.  Avoid drinking alcohol if it caused your condition.  Do not smoke.  Get plenty of rest.  Check your blood sugar at home as directed by your caregiver.  Keep all follow-up appointments as directed by your caregiver. SEEK MEDICAL CARE IF:   You do not recover as quickly as expected.  You develop new or worsening symptoms.  You have persistent pain, weakness, or nausea.  You recover and then have another episode of pain. SEEK IMMEDIATE MEDICAL CARE IF:   You are unable to eat or keep fluids down.  Your pain becomes severe.  You have a fever or persistent symptoms for more than 2 to 3 days.  You have a fever and your symptoms suddenly get worse.  Your skin or the white part of your eyes turn yellow (jaundice).  You develop vomiting.  You feel dizzy, or you faint.  Your blood sugar is high (over 300 mg/dL). MAKE SURE YOU:  Understand these instructions.  Will watch your condition.  Will get help right away if you are not doing well or get worse. Document Released: 01/15/2005 Document Revised: 07/17/2011 Document Reviewed: 04/26/2011 [facility] Patient Information 2015 [place], [place]. This information is not intended to replace advice given to you by  your health care provider. Make sure you discuss any questions you have with your health care provider.

## 2014-03-22 ENCOUNTER — Encounter: Payer: Self-pay | Admitting: Physician Assistant

## 2014-03-22 ENCOUNTER — Ambulatory Visit (INDEPENDENT_AMBULATORY_CARE_PROVIDER_SITE_OTHER): Payer: BC Managed Care – PPO | Admitting: Physician Assistant

## 2014-03-22 ENCOUNTER — Other Ambulatory Visit (INDEPENDENT_AMBULATORY_CARE_PROVIDER_SITE_OTHER): Payer: BC Managed Care – PPO

## 2014-03-22 VITALS — BP 114/80 | HR 100 | Ht 63.0 in | Wt 200.0 lb

## 2014-03-22 DIAGNOSIS — K858 Other acute pancreatitis without necrosis or infection: Secondary | ICD-10-CM

## 2014-03-22 LAB — TRIGLYCERIDES: Triglycerides: 71 mg/dL (ref 0.0–149.0)

## 2014-03-22 MED ORDER — OXYCODONE-ACETAMINOPHEN 5-325 MG PO TABS: 1.0000 | ORAL_TABLET | Freq: Four times a day (QID) | ORAL | Status: DC | PRN

## 2014-03-22 MED ORDER — ONDANSETRON 4 MG PO TBDP: 4.0000 mg | ORAL_TABLET | Freq: Four times a day (QID) | ORAL | Status: DC

## 2014-03-22 NOTE — Progress Notes (Signed)
Patient ID: Laura Shah, female   DOB: 02-13-1980, 34 y.o.   MRN: [id number]   Subjective:    Patient ID: Laura Shah, female    DOB: [birth year], 34 y.o.   MRN: [id number]  HPI Laura Shah is a pleasant 34 year old African-American female, new to GI referred by the emergency room after recent ER visit on 03/16/2014 at which time she was diagnosed with acute pancreatitis. Patient does have history of interstitial cystitis. She's not had any prior GI issues. She is status post appendectomy and ovarian cystectomy. She also had a small perirectal abscess in 2015. She is employed as a Runner, broadcasting/film/video. She says she had acute onset of abdominal pain last Wednesday high in her upper abdomen and was associated with palpitations and some shortness of breath. She says the pain was pretty intense and she went to the emergency room. She did not have any imaging studies done but did have a lipase of 60 LFTs were normal with the exception of an alkaline phosphatase of 119 and CBC was unremarkable. She was given an analgesic, anti-emetics ,and discharged home. Patient had had previous upper abdominal ultrasound in 2014 which was unremarkable. She says she really does not feel much better , and has been having ongoing upper abdominal pain radiating around into her back. She missed a couple of days of work at the end of last week but went to work today though she says she still quite uncomfortable. She has been nauseated without any vomiting and has been eating very little. She's more uncomfortable after she eats. She has also been more constipated. No documented fever ,has had some sweats. She denies any recent EtOH use and does not drink alcohol on any sort of regular basis, no new medications history of GI issues. She is not aware of having elevated lipids.  Review of Systems Pertinent positive and negative review of systems were noted in the above HPI section.  All other review of systems was otherwise  negative.  Outpatient Encounter Prescriptions as of 03/22/2014  Medication Sig  . citalopram (CELEXA) 20 MG tablet Take 1 tablet by mouth daily.  Marland Kitchen levonorgestrel (MIRENA) 20 MCG/24HR IUD 1 each by Intrauterine route once.  . ondansetron (ZOFRAN ODT) 4 MG disintegrating tablet Take 1 tablet (4 mg total) by mouth every 6 (six) hours.  Marland Kitchen oxyCODONE-acetaminophen (PERCOCET/ROXICET) 5-325 MG per tablet Take 1 tablet by mouth every 6 (six) hours as needed for severe pain.  . [DISCONTINUED] ondansetron (ZOFRAN ODT) 4 MG disintegrating tablet Take 1 tablet (4 mg total) by mouth every 8 (eight) hours as needed for nausea or vomiting.  . [DISCONTINUED] oxyCODONE-acetaminophen (PERCOCET/ROXICET) 5-325 MG per tablet Take 1 tablet by mouth every 8 (eight) hours as needed for severe pain.   No Known Allergies Patient Active Problem List   Diagnosis Date Noted  . Interstitial cystitis   . Tachycardia    History   Social History  . Marital Status: Single    Spouse Name: N/A  . Number of Children: N/A  . Years of Education: N/A   Occupational History  . Not on file.   Social History Main Topics  . Smoking status: Never Smoker   . Smokeless tobacco: Never Used  . Alcohol Use: No  . Drug Use: No  . Sexual Activity: Yes    Birth Control/ Protection: IUD   Other Topics Concern  . Not on file   Social History Narrative    Laura Shah's family history includes Breast cancer in  her maternal grandmother and paternal grandmother; Diabetes in her father, father, and paternal grandmother; Hypertension in her father.      Objective:    Filed Vitals:   03/22/14 1451  BP: 114/80  Pulse: 100    Physical Exam  well-developed young African-American female in no acute distress, pleasant blood pressure 114/80 pulse 100 height 5 foot 3 weight 200. HEENT; nontraumatic normocephalic EOMI PERRLA sclera anicteric, Supple; no JVD, Cardiovascular; regular rate and rhythm with S1-S2, Pulmonary; clear  bilaterally, Abdomen; soft bowel sounds are present she is tender across the upper abdomen is no rebound no palpable mass or hepatosplenomegaly, Rectal; exam not done, Ext;no clubbing cyanosis or edema skin warm and dry, Psych; mood and affect appropriate       Assessment & Plan:   #1 34 yo female with acute pancreatitis  of unclear etiology. Patient with persistent pain and nausea 1 week after onset. We will need to rule out choledocholithiasis, hyper triglyceridemia, Pancreas divisum as possible etiologies #2 interstitial cystitis  Plan; Patient is to push oral fluids and low-fat full liquid diet gradually advanced to bland diet / low-fat Repeat labs today -amylase, lipase ,CMET CBC and triglyceride level Schedule for CT scan of the abdomen and pelvis with contrast Patient is reluctant to use Percocet because she is trying to work, she will use Tylenol as needed for pain Have refilled the Percocet for when necessary use and also will refill Zofran 4 mg every 6 hours when necessary for nausea Further plans pending results of above   CC; Gerhard Munchobert Lockwood MD/ER       Aviya Jarvie Oswald HillockS [facility] PA-C 03/22/2014

## 2014-03-22 NOTE — Patient Instructions (Signed)
Please go to the basement level to have your labs drawn.  We sent prescriptions to [facility], [place].    You have been scheduled for a CT scan of the abdomen and pelvis at [facility] ([address]---this is in the same building as Press photographer).   You are scheduled on Wed 03-24-2014 at 2:00 Pm . You should arrive at 1:15  for registration. Please follow the written instructions below on the day of your exam:  WARNING: IF YOU ARE ALLERGIC TO IODINE/X-RAY DYE, PLEASE NOTIFY RADIOLOGY IMMEDIATELY AT [phone]! YOU WILL BE GIVEN A 13 HOUR PREMEDICATION PREP.  1) Do not eat or drink anything after 10:00 am  (4 hours prior to your test) 2) You have been given 2 bottles of oral contrast to drink. The solution may taste better if refrigerated, but do NOT add ice or any other liquid to this solution. Shake well before drinking.    Drink 1 bottle of contrast @ Noon  (2 hours prior to your exam)  Drink 1 bottle of contrast @ 1:00 PM (1 hour prior to your exam)  You may take any medications as prescribed with a small amount of water except for the following: Metformin, Glucophage, Glucovance, Avandamet, Riomet, Fortamet, Actoplus Met, Janumet, Glumetza or Metaglip. The above medications must be held the day of the exam AND 48 hours after the exam.  The purpose of you drinking the oral contrast is to aid in the visualization of your intestinal tract. The contrast solution may cause some diarrhea. Before your exam is started, you will be given a small amount of fluid to drink. Depending on your individual set of symptoms, you may also receive an intravenous injection of x-ray contrast/dye. Plan on being at [facility] for 30 minutes or long, depending on the type of exam you are having performed.  If you have any questions regarding your exam or if you need to reschedule, you may call the CT department at [phone] between the hours of 8:00 am and 5:00 pm,  Monday-Friday.  ________________________________________________________________________

## 2014-03-23 ENCOUNTER — Ambulatory Visit (INDEPENDENT_AMBULATORY_CARE_PROVIDER_SITE_OTHER)
Admission: RE | Admit: 2014-03-23 | Discharge: 2014-03-23 | Disposition: A | Discharge status: Home / Self Care | Payer: BC Managed Care – PPO | Source: Ambulatory Visit | Attending: Physician Assistant | Admitting: Physician Assistant

## 2014-03-23 ENCOUNTER — Ambulatory Visit: Payer: BC Managed Care – PPO | Admitting: Physician Assistant

## 2014-03-23 DIAGNOSIS — K858 Other acute pancreatitis without necrosis or infection: Secondary | ICD-10-CM

## 2014-03-23 MED ORDER — IOHEXOL 300 MG/ML  SOLN
100.0000 mL | Freq: Once | INTRAMUSCULAR | Status: AC | PRN
  Administered 2014-03-23: 100 mL via INTRAVENOUS

## 2014-03-23 NOTE — Progress Notes (Signed)
i agree with the above note, plan 

## 2014-03-24 ENCOUNTER — Other Ambulatory Visit: Payer: Self-pay

## 2014-03-24 ENCOUNTER — Inpatient Hospital Stay: Admission: RE | Admit: 2014-03-24 | Payer: BC Managed Care – PPO | Source: Ambulatory Visit

## 2014-03-24 DIAGNOSIS — K858 Other acute pancreatitis without necrosis or infection: Secondary | ICD-10-CM

## 2014-04-05 ENCOUNTER — Ambulatory Visit (HOSPITAL_COMMUNITY)
Admission: RE | Admit: 2014-04-05 | Discharge: 2014-04-05 | Disposition: A | Discharge status: Home / Self Care | Payer: BC Managed Care – PPO | Source: Ambulatory Visit | Attending: Physician Assistant | Admitting: Physician Assistant

## 2014-04-05 ENCOUNTER — Encounter (HOSPITAL_COMMUNITY): Payer: Self-pay

## 2014-04-05 DIAGNOSIS — R1011 Right upper quadrant pain: Secondary | ICD-10-CM | POA: Diagnosis present

## 2014-04-05 DIAGNOSIS — K858 Other acute pancreatitis without necrosis or infection: Secondary | ICD-10-CM

## 2014-04-05 MED ORDER — TECHNETIUM TC 99M MEBROFENIN IV KIT
5.4000 | PACK | Freq: Once | INTRAVENOUS | Status: AC | PRN
  Administered 2014-04-05: 5.4 via INTRAVENOUS

## 2014-04-05 MED ORDER — SINCALIDE 5 MCG IJ SOLR
0.0200 ug/kg | Freq: Once | INTRAMUSCULAR | Status: AC
  Administered 2014-04-05: 1.8 ug via INTRAVENOUS

## 2014-06-25 ENCOUNTER — Emergency Department (HOSPITAL_BASED_OUTPATIENT_CLINIC_OR_DEPARTMENT_OTHER): Payer: Worker's Compensation

## 2014-06-25 ENCOUNTER — Emergency Department (HOSPITAL_BASED_OUTPATIENT_CLINIC_OR_DEPARTMENT_OTHER)
Admission: EM | Admit: 2014-06-25 | Discharge: 2014-06-25 | Disposition: A | Discharge status: Home / Self Care | Payer: Worker's Compensation | Attending: Emergency Medicine | Admitting: Emergency Medicine

## 2014-06-25 ENCOUNTER — Encounter (HOSPITAL_BASED_OUTPATIENT_CLINIC_OR_DEPARTMENT_OTHER): Payer: Self-pay

## 2014-06-25 DIAGNOSIS — T23131A Burn of first degree of multiple right fingers (nail), not including thumb, initial encounter: Secondary | ICD-10-CM | POA: Insufficient documentation

## 2014-06-25 DIAGNOSIS — T23121A Burn of first degree of single right finger (nail) except thumb, initial encounter: Secondary | ICD-10-CM

## 2014-06-25 DIAGNOSIS — Y9289 Other specified places as the place of occurrence of the external cause: Secondary | ICD-10-CM | POA: Diagnosis not present

## 2014-06-25 DIAGNOSIS — Z8719 Personal history of other diseases of the digestive system: Secondary | ICD-10-CM | POA: Diagnosis not present

## 2014-06-25 DIAGNOSIS — Z87448 Personal history of other diseases of urinary system: Secondary | ICD-10-CM | POA: Insufficient documentation

## 2014-06-25 DIAGNOSIS — Y9389 Activity, other specified: Secondary | ICD-10-CM | POA: Insufficient documentation

## 2014-06-25 DIAGNOSIS — Z8679 Personal history of other diseases of the circulatory system: Secondary | ICD-10-CM | POA: Insufficient documentation

## 2014-06-25 DIAGNOSIS — S6991XA Unspecified injury of right wrist, hand and finger(s), initial encounter: Secondary | ICD-10-CM | POA: Diagnosis present

## 2014-06-25 DIAGNOSIS — W228XXA Striking against or struck by other objects, initial encounter: Secondary | ICD-10-CM | POA: Diagnosis not present

## 2014-06-25 DIAGNOSIS — R000 Tachycardia, unspecified: Secondary | ICD-10-CM | POA: Insufficient documentation

## 2014-06-25 DIAGNOSIS — X58XXXA Exposure to other specified factors, initial encounter: Secondary | ICD-10-CM | POA: Insufficient documentation

## 2014-06-25 DIAGNOSIS — S60221A Contusion of right hand, initial encounter: Secondary | ICD-10-CM | POA: Diagnosis not present

## 2014-06-25 DIAGNOSIS — Y998 Other external cause status: Secondary | ICD-10-CM | POA: Diagnosis not present

## 2014-06-25 NOTE — ED Provider Notes (Addendum)
CSN: [id number]     Arrival date & time 06/25/14  1604 History   First MD Initiated Contact with Patient 06/25/14 1618     Chief Complaint  Patient presents with  . Hand Injury     (Consider location/radiation/quality/duration/timing/severity/associated sxs/prior Treatment) Patient is a 34 y.o. female presenting with hand injury. The history is provided by the patient.  Hand Injury Location:  Finger Injury: yes   Mechanism of injury comment:  Was playing tug of war in the rope broke hitting her hand Finger location:  R ring finger, R little finger and R middle finger Pain details:    Quality:  Aching and burning   Radiates to:  Does not radiate   Severity:  Moderate   Onset quality:  Sudden   Timing:  Constant   Progression:  Worsening Chronicity:  New Handedness:  Right-handed Prior injury to area:  No Relieved by:  None tried Worsened by:  Movement Ineffective treatments:  None tried Associated symptoms: decreased range of motion and swelling   Associated symptoms comment:  Burn on the fingers were rope slipped. Also some swelling and redness in those areas   Past Medical History  Diagnosis Date  . Interstitial cystitis   . Tachycardia   . Heart valve regurgitation   . Pancreatitis    Past Surgical History  Procedure Laterality Date  . Ovarian cyst removal    . Appendectomy     Family History  Problem Relation Age of Onset  . Breast cancer Maternal Grandmother   . Breast cancer Paternal Grandmother   . Diabetes Paternal Grandmother   . Diabetes Father   . Diabetes Father   . Hypertension Father    History  Substance Use Topics  . Smoking status: Never Smoker   . Smokeless tobacco: Never Used  . Alcohol Use: No   OB History    No data available     Review of Systems  All other systems reviewed and are negative.     Allergies  Review of patient's allergies indicates no known allergies.  Home Medications   Prior to Admission medications     Medication Sig Start Date End Date Taking? Authorizing Provider  levonorgestrel (MIRENA) 20 MCG/24HR IUD 1 each by Intrauterine route once.    Historical Provider, MD   BP 147/78 mmHg  Pulse 110  Temp(Src) 98.4 F (36.9 C) (Oral)  Resp 16  Ht  (1.575 m)  Wt 190 lb (86.183 kg)  BMI 34.74 kg/m2  SpO2 98% Physical Exam  Constitutional: She is oriented to person, place, and time. She appears well-developed and well-nourished. No distress.  HENT:  Head: Normocephalic and atraumatic.  Eyes: EOM are normal. Pupils are equal, round, and reactive to light.  Cardiovascular: Tachycardia present.   Pulmonary/Chest: Effort normal.  Musculoskeletal:       Hands: No right wrist pain, no pain to the thenar eminence or the thumb. Mild decreased range of motion of the involved fingers due to pain.  Neurological: She is alert and oriented to person, place, and time.  Skin: Skin is warm and dry.  Nursing note and vitals reviewed.   ED Course  Procedures (including critical care time) Labs Review Labs Reviewed - No data to display  Imaging Review Dg Hand Complete Right  06/25/2014   CLINICAL DATA:  Right hand injury at work today-playing tug of war and rope broke, pt c.o pain in 4th and 5th fingers  EXAM: RIGHT HAND - COMPLETE 3+ VIEW  COMPARISON:  None.  FINDINGS: There is no evidence of fracture or dislocation. There is no evidence of arthropathy or other focal bone abnormality. Soft tissues are unremarkable.  IMPRESSION: Negative.   Electronically Signed   By: Amie Portlandavid  Ormond M.D.   On: 06/25/2014 16:53     EKG Interpretation None      MDM   Final diagnoses:  Burn of finger, right, first degree, initial encounter  Hand contusion, right, initial encounter    Patient with an injury to the hand today while playing tug of war when the rope broke. She has small burn mark over her left palmar third through fifth digits. Swelling and tenderness in the same area. Decreased range of motion  due to pain. Imaging is negative for acute fracture. Recommend that patient use ice, ibuprofen and Tylenol as needed for pain.    Gwyneth SproutWhitney Damek Ende, MD 06/25/14 1706  Gwyneth SproutWhitney Tametria Aho, MD 06/25/14 [id number]

## 2014-06-25 NOTE — Discharge Instructions (Signed)
Burn Care Burns hurt your skin. When your skin is hurt, it is easier to get an infection. Follow your doctor's directions to help prevent an infection. HOME CARE  Wash your hands well before you change your bandage.  Change your bandage as often as told by your doctor.  Remove the old bandage. If the bandage sticks, soak it off with cool, clean water.  Gently clean the burn with mild soap and water.  Pat the burn dry with a clean, dry cloth.  Put a thin layer of medicated cream on the burn.  Put a clean bandage on as told by your doctor.  Keep the bandage clean and dry.  Raise (elevate) the burn for the first 24 hours. After that, follow your doctor's directions.  Only take medicine as told by your doctor. GET HELP RIGHT AWAY IF:   You have too much pain.  The skin near the burn is red, tender, puffy (swollen), or has red streaks.  The burn area has yellowish white fluid (pus) or a bad smell coming from it.  You have a fever. MAKE SURE YOU:   Understand these instructions.  Will watch your condition.  Will get help right away if you are not doing well or get worse. Document Released: 10/25/2007 Document Revised: 04/09/2011 Document Reviewed: 06/07/2010 [facility] Patient Information 2015 [place]. This information is not intended to replace advice given to you by your health care provider. Make sure you discuss any questions you have with your health care provider.  Contusion A contusion is a deep bruise. Contusions are the result of an injury that caused bleeding under the skin. The contusion may turn blue, purple, or yellow. Minor injuries will give you a painless contusion, but more severe contusions may stay painful and swollen for a few weeks.  CAUSES  A contusion is usually caused by a blow, trauma, or direct force to an area of the body. SYMPTOMS   Swelling and redness of the injured area.  Bruising of the injured area.  Tenderness and soreness of the  injured area.  Pain. DIAGNOSIS  The diagnosis can be made by taking a history and physical exam. An X-ray, CT scan, or MRI may be needed to determine if there were any associated injuries, such as fractures. TREATMENT  Specific treatment will depend on what area of the body was injured. In general, the best treatment for a contusion is resting, icing, elevating, and applying cold compresses to the injured area. Over-the-counter medicines may also be recommended for pain control. Ask your caregiver what the best treatment is for your contusion. HOME CARE INSTRUCTIONS   Put ice on the injured area.  Put ice in a plastic bag.  Place a towel between your skin and the bag.  Leave the ice on for 15-20 minutes, 3-4 times a day, or as directed by your health care provider.  Only take over-the-counter or prescription medicines for pain, discomfort, or fever as directed by your caregiver. Your caregiver may recommend avoiding anti-inflammatory medicines (aspirin, ibuprofen, and naproxen) for 48 hours because these medicines may increase bruising.  Rest the injured area.  If possible, elevate the injured area to reduce swelling. SEEK IMMEDIATE MEDICAL CARE IF:   You have increased bruising or swelling.  You have pain that is getting worse.  Your swelling or pain is not relieved with medicines. MAKE SURE YOU:   Understand these instructions.  Will watch your condition.  Will get help right away if you are not  doing well or get worse. Document Released: 10/25/2004 Document Revised: 01/20/2013 Document Reviewed: 11/20/2010 [facility] Patient Information 2015 [place], [place]. This information is not intended to replace advice given to you by your health care provider. Make sure you discuss any questions you have with your health care provider.

## 2014-06-25 NOTE — ED Notes (Signed)
Right hand injury at work today-playing tug of war and rope broke

## 2014-07-29 ENCOUNTER — Encounter: Payer: Self-pay | Admitting: Family Medicine

## 2014-07-29 ENCOUNTER — Ambulatory Visit (INDEPENDENT_AMBULATORY_CARE_PROVIDER_SITE_OTHER): Payer: Worker's Compensation | Admitting: Family Medicine

## 2014-07-29 VITALS — BP 121/82 | HR 87 | Ht 63.0 in | Wt 185.0 lb

## 2014-07-29 DIAGNOSIS — S6991XA Unspecified injury of right wrist, hand and finger(s), initial encounter: Secondary | ICD-10-CM

## 2014-07-29 NOTE — Patient Instructions (Signed)
You have finger sprains. Ice areas 15 minutes at a time 3-4 times a day. Aleve 2 tabs twice a day with food OR ibuprofen  three times a day with food for pain and inflammation. Start occupational/hand therapy to help regain motion, strength, decrease pain. Buddy tape fingers together for support. Follow up with me in 1 month to 6 weeks. If not improving still would consider MRI.

## 2014-08-03 DIAGNOSIS — S6991XA Unspecified injury of right wrist, hand and finger(s), initial encounter: Secondary | ICD-10-CM | POA: Insufficient documentation

## 2014-08-03 NOTE — Progress Notes (Signed)
PCP: NNODI, Alesia RichardsADAKU, MD  Subjective:   HPI: Patient is a 34 y.o. female here for right hand injury.  Patient reports she was at school during field day playing tug of war when the rope snapped and she injured her 4th and 5th digits of the right hand. Unsure if these bent backwards at the time. + bruising, swelling. Injury occurred 5/27. Pain 5/10 at rest, 9/10 with movement. Is right handed. She had a small rope burn 5th digit, bruising swelling volar 3rd-5th digits in ED day of injury. No prior injuries.  Past Medical History  Diagnosis Date  . Interstitial cystitis   . Tachycardia   . Heart valve regurgitation   . Pancreatitis     Current Outpatient Prescriptions on File Prior to Visit  Medication Sig Dispense Refill  . levonorgestrel (MIRENA) 20 MCG/24HR IUD 1 each by Intrauterine route once.     No current facility-administered medications on file prior to visit.    Past Surgical History  Procedure Laterality Date  . Ovarian cyst removal    . Appendectomy      No Known Allergies  History   Social History  . Marital Status: Single    Spouse Name: N/A  . Number of Children: N/A  . Years of Education: N/A   Occupational History  . Not on file.   Social History Main Topics  . Smoking status: Never Smoker   . Smokeless tobacco: Never Used  . Alcohol Use: No  . Drug Use: No  . Sexual Activity: Yes    Birth Control/ Protection: IUD   Other Topics Concern  . Not on file   Social History Narrative    Family History  Problem Relation Age of Onset  . Breast cancer Maternal Grandmother   . Breast cancer Paternal Grandmother   . Diabetes Paternal Grandmother   . Diabetes Father   . Diabetes Father   . Hypertension Father     BP 121/82 mmHg  Pulse 87  Ht 5\' 3"  (1.6 m)  Wt 185 lb (83.915 kg)  BMI 32.78 kg/m2  Review of Systems: See HPI above.    Objective:  Physical Exam:  Gen: NAD  Right hand: Mild swelling, no bruising volar 4th, 5th  digits.  No angulation, malrotation. TTP mildly volar 4th and 5th digits. Able to flex and extend with 5/5 strength at PIP, DIP, MCP joints of 4th and 5th digits. Collateral ligaments intact. NVI distally.    Assessment & Plan:  1. Right 4th, 5th digit injuries - radiographs negative, reassuring.  Exam also reassuring for severe ligament or tendon injuries.  Consistent with finger sprains.  Icing, nsaids, start occupational therapy to help regain full motion and strength, decrease pain.  Buddy taping for support.  F/u in 1 month to 6 weeks.  Consider MRI if not improving.

## 2014-08-03 NOTE — Assessment & Plan Note (Signed)
radiographs negative, reassuring.  Exam also reassuring for severe ligament or tendon injuries.  Consistent with finger sprains.  Icing, nsaids, start occupational therapy to help regain full motion and strength, decrease pain.  Buddy taping for support.  F/u in 1 month to 6 weeks.  Consider MRI if not improving.

## 2014-08-27 ENCOUNTER — Encounter: Payer: Self-pay | Admitting: Family Medicine

## 2014-08-27 ENCOUNTER — Ambulatory Visit (INDEPENDENT_AMBULATORY_CARE_PROVIDER_SITE_OTHER): Payer: Worker's Compensation | Admitting: Family Medicine

## 2014-08-27 VITALS — BP 110/72 | HR 83 | Ht 62.0 in | Wt 198.0 lb

## 2014-08-27 DIAGNOSIS — S6991XD Unspecified injury of right wrist, hand and finger(s), subsequent encounter: Secondary | ICD-10-CM

## 2014-08-27 NOTE — Patient Instructions (Signed)
You have finger sprains, strain of extensor tendons. Continue therapy until finished but continue the home exercises daily until you see me back. Follow up with me in 6 weeks. Only ice, aleve/ibuprofen, buddy taping if needed - all of these not absolutely necessary at this point.

## 2014-08-27 NOTE — Progress Notes (Signed)
PCP: NNODI, Alesia Richards, MD  Subjective:   HPI: Patient is a 34 y.o. female here for right hand injury.  6/30: Patient reports she was at school during field day playing tug of war when the rope snapped and she injured her 4th and 5th digits of the right hand. Unsure if these bent backwards at the time. + bruising, swelling. Injury occurred 5/27. Pain 5/10 at rest, 9/10 with movement. Is right handed. She had a small rope burn 5th digit, bruising swelling volar 3rd-5th digits in ED day of injury. No prior injuries.  7/29: Patient reports improvement since last visit. Pain down to 5/10 at worst. Doing occupational therapy and home exercises. Still with stiffness, pain. Hand did feel weak the other day for a few hours. Sometimes will feel like it spasms in flexion and difficulty using it. No new injuries.  Past Medical History  Diagnosis Date  . Interstitial cystitis   . Tachycardia   . Heart valve regurgitation   . Pancreatitis     Current Outpatient Prescriptions on File Prior to Visit  Medication Sig Dispense Refill  . levonorgestrel (MIRENA) 20 MCG/24HR IUD 1 each by Intrauterine route once.     No current facility-administered medications on file prior to visit.    Past Surgical History  Procedure Laterality Date  . Ovarian cyst removal    . Appendectomy      No Known Allergies  History   Social History  . Marital Status: Single    Spouse Name: N/A  . Number of Children: N/A  . Years of Education: N/A   Occupational History  . Not on file.   Social History Main Topics  . Smoking status: Never Smoker   . Smokeless tobacco: Never Used  . Alcohol Use: No  . Drug Use: No  . Sexual Activity: Yes    Birth Control/ Protection: IUD   Other Topics Concern  . Not on file   Social History Narrative    Family History  Problem Relation Age of Onset  . Breast cancer Maternal Grandmother   . Breast cancer Paternal Grandmother   . Diabetes Paternal  Grandmother   . Diabetes Father   . Diabetes Father   . Hypertension Father     BP 110/72 mmHg  Pulse 83  Ht  (1.575 m)  Wt 198 lb (89.812 kg)  BMI 36.21 kg/m2  Review of Systems: See HPI above.    Objective:  Physical Exam:  Gen: NAD  Right hand: No swelling, bruising.  No angulation, malrotation. TTP mildly dorsal 4th and 5th digits. Able to flex and extend with 5/5 strength at PIP, DIP, MCP joints of 4th and 5th digits. Collateral ligaments intact. NVI distally.    Assessment & Plan:  1. Right 4th, 5th digit injuries - radiographs negative, reassuring.  Improving with occupational therapy albeit slowly.  Exam again does not indicate severe ligament or tendon injuries.  Consistent with finger sprains, extensor strains.  Icing, nsaids as needed.  Continue occupational therapy to regain full motion and strength, decrease pain.  F/u in 6 weeks.

## 2014-08-27 NOTE — Assessment & Plan Note (Signed)
radiographs negative, reassuring.  Improving with occupational therapy albeit slowly.  Exam again does not indicate severe ligament or tendon injuries.  Consistent with finger sprains, extensor strains.  Icing, nsaids as needed.  Continue occupational therapy to regain full motion and strength, decrease pain.  F/u in 6 weeks.

## 2014-08-28 ENCOUNTER — Emergency Department (HOSPITAL_BASED_OUTPATIENT_CLINIC_OR_DEPARTMENT_OTHER)
Admission: EM | Admit: 2014-08-28 | Discharge: 2014-08-28 | Disposition: A | Discharge status: Home / Self Care | Payer: BC Managed Care – PPO | Attending: Emergency Medicine | Admitting: Emergency Medicine

## 2014-08-28 ENCOUNTER — Emergency Department (HOSPITAL_BASED_OUTPATIENT_CLINIC_OR_DEPARTMENT_OTHER): Payer: BC Managed Care – PPO

## 2014-08-28 ENCOUNTER — Encounter (HOSPITAL_BASED_OUTPATIENT_CLINIC_OR_DEPARTMENT_OTHER): Payer: Self-pay | Admitting: Emergency Medicine

## 2014-08-28 DIAGNOSIS — Z8742 Personal history of other diseases of the female genital tract: Secondary | ICD-10-CM | POA: Insufficient documentation

## 2014-08-28 DIAGNOSIS — R0602 Shortness of breath: Secondary | ICD-10-CM | POA: Diagnosis not present

## 2014-08-28 DIAGNOSIS — Z8719 Personal history of other diseases of the digestive system: Secondary | ICD-10-CM | POA: Diagnosis not present

## 2014-08-28 DIAGNOSIS — Z9089 Acquired absence of other organs: Secondary | ICD-10-CM | POA: Diagnosis not present

## 2014-08-28 DIAGNOSIS — R1013 Epigastric pain: Secondary | ICD-10-CM | POA: Diagnosis present

## 2014-08-28 DIAGNOSIS — R1010 Upper abdominal pain, unspecified: Secondary | ICD-10-CM | POA: Insufficient documentation

## 2014-08-28 DIAGNOSIS — Z8679 Personal history of other diseases of the circulatory system: Secondary | ICD-10-CM | POA: Insufficient documentation

## 2014-08-28 DIAGNOSIS — R110 Nausea: Secondary | ICD-10-CM | POA: Insufficient documentation

## 2014-08-28 LAB — TROPONIN I: Troponin I: <0.03 ng/mL (ref <0.031)

## 2014-08-28 LAB — BASIC METABOLIC PANEL
Anion gap: 10 (ref 5–15)
BUN: 8 mg/dL (ref 6–20)
CO2: 24 mmol/L (ref 22–32)
Calcium: 8.9 mg/dL (ref 8.9–10.3)
Chloride: 105 mmol/L (ref 101–111)
Creatinine, Ser: 0.81 mg/dL (ref 0.44–1.00)
GFR calc Af Amer: >60 mL/min (ref >60)
GFR calc non Af Amer: >60 mL/min (ref >60)
Glucose, Bld: 116 mg/dL — ABNORMAL HIGH (ref 65–99)
Potassium: 3.5 mmol/L (ref 3.5–5.1)
Sodium: 139 mmol/L (ref 135–145)

## 2014-08-28 LAB — HEPATIC FUNCTION PANEL
ALT: 17 U/L (ref 14–54)
AST: 22 U/L (ref 15–41)
Albumin: 3.8 g/dL (ref 3.5–5.0)
Alkaline Phosphatase: 113 U/L (ref 38–126)
Bilirubin, Direct: <0.1 mg/dL — ABNORMAL LOW (ref 0.1–0.5)
Total Bilirubin: 0.4 mg/dL (ref 0.3–1.2)
Total Protein: 8 g/dL (ref 6.5–8.1)

## 2014-08-28 LAB — CBC
HCT: 38.6 % (ref 36.0–46.0)
Hemoglobin: 12.5 g/dL (ref 12.0–15.0)
MCH: 25.8 pg — ABNORMAL LOW (ref 26.0–34.0)
MCHC: 32.4 g/dL (ref 30.0–36.0)
MCV: 79.6 fL (ref 78.0–100.0)
Platelets: 314 10*3/uL (ref 150–400)
RBC: 4.85 MIL/uL (ref 3.87–5.11)
RDW: 15 % (ref 11.5–15.5)
WBC: 9.2 10*3/uL (ref 4.0–10.5)

## 2014-08-28 LAB — LIPASE, BLOOD: Lipase: 46 U/L (ref 22–51)

## 2014-08-28 MED ORDER — GI COCKTAIL ~~LOC~~
30.0000 mL | Freq: Once | ORAL | Status: AC
  Administered 2014-08-28: 30 mL via ORAL
  Filled 2014-08-28: qty 30

## 2014-08-28 MED ORDER — DIPHENHYDRAMINE HCL 50 MG/ML IJ SOLN
25.0000 mg | Freq: Once | INTRAMUSCULAR | Status: AC
  Administered 2014-08-28: 25 mg via INTRAVENOUS

## 2014-08-28 MED ORDER — ONDANSETRON HCL 4 MG/2ML IJ SOLN
4.0000 mg | Freq: Once | INTRAMUSCULAR | Status: AC
  Administered 2014-08-28: 4 mg via INTRAVENOUS
  Filled 2014-08-28: qty 2

## 2014-08-28 MED ORDER — PANTOPRAZOLE SODIUM 40 MG PO TBEC: 40.0000 mg | DELAYED_RELEASE_TABLET | Freq: Every day | ORAL | Status: AC

## 2014-08-28 MED ORDER — SODIUM CHLORIDE 0.9 % IV SOLN
1000.0000 mL | Freq: Once | INTRAVENOUS | Status: AC
  Administered 2014-08-28: 1000 mL via INTRAVENOUS

## 2014-08-28 MED ORDER — ONDANSETRON HCL 4 MG PO TABS: 4.0000 mg | ORAL_TABLET | Freq: Four times a day (QID) | ORAL | Status: DC | PRN

## 2014-08-28 MED ORDER — PANTOPRAZOLE SODIUM 40 MG IV SOLR
40.0000 mg | Freq: Once | INTRAVENOUS | Status: AC
  Administered 2014-08-28: 40 mg via INTRAVENOUS
  Filled 2014-08-28: qty 40

## 2014-08-28 MED ORDER — OXYCODONE-ACETAMINOPHEN 5-325 MG PO TABS: 1.0000 | ORAL_TABLET | ORAL | Status: DC | PRN

## 2014-08-28 MED ORDER — MORPHINE SULFATE 4 MG/ML IJ SOLN
4.0000 mg | Freq: Once | INTRAMUSCULAR | Status: AC
  Administered 2014-08-28: 4 mg via INTRAVENOUS
  Filled 2014-08-28: qty 1

## 2014-08-28 MED ORDER — DIPHENHYDRAMINE HCL 50 MG/ML IJ SOLN
INTRAMUSCULAR | Status: AC
  Filled 2014-08-28: qty 1

## 2014-08-28 MED ORDER — SODIUM CHLORIDE 0.9 % IV SOLN
1000.0000 mL | INTRAVENOUS | Status: DC
  Administered 2014-08-28: 1000 mL via INTRAVENOUS

## 2014-08-28 NOTE — ED Notes (Signed)
Pt reports that she awoke an hour ago gasping for breath, mid chest pain with radiation to back, + nausea, diaphoretic, sob

## 2014-08-28 NOTE — Discharge Instructions (Signed)
Your tests today did not show any inflammation of your pancreas. I suspect that the problem could be either ulcers or acid reflux. Please make an appointment with your gastroenterologist for further evaluation.  Pantoprazole tablets What is this medicine? PANTOPRAZOLE (pan TOE pra zole) prevents the production of acid in the stomach. It is used to treat gastroesophageal reflux disease (GERD), inflammation of the esophagus, and Zollinger-Ellison syndrome. This medicine may be used for other purposes; ask your health care provider or pharmacist if you have questions. COMMON BRAND NAME(S): Protonix What should I tell my health care provider before I take this medicine? They need to know if you have any of these conditions: -liver disease -low levels of magnesium in the blood -an unusual or allergic reaction to omeprazole, lansoprazole, pantoprazole, rabeprazole, other medicines, foods, dyes, or preservatives -pregnant or trying to get pregnant -breast-feeding How should I use this medicine? Take this medicine by mouth. Swallow the tablets whole with a drink of water. Follow the directions on the prescription label. Do not crush, break, or chew. Take your medicine at regular intervals. Do not take your medicine more often than directed. Talk to your pediatrician regarding the use of this medicine in children. While this drug may be prescribed for children as young as 5 years for selected conditions, precautions do apply. Overdosage: If you think you have taken too much of this medicine contact a poison control center or emergency room at once. NOTE: This medicine is only for you. Do not share this medicine with others. What if I miss a dose? If you miss a dose, take it as soon as you can. If it is almost time for your next dose, take only that dose. Do not take double or extra doses. What may interact with this medicine? Do not take this medicine with any of the following  medications: -atazanavir -nelfinavir This medicine may also interact with the following medications: -ampicillin -delavirdine -digoxin -diuretics -iron salts -medicines for fungal infections like ketoconazole, itraconazole and voriconazole -warfarin This list may not describe all possible interactions. Give your health care provider a list of all the medicines, herbs, non-prescription drugs, or dietary supplements you use. Also tell them if you smoke, drink alcohol, or use illegal drugs. Some items may interact with your medicine. What should I watch for while using this medicine? It can take several days before your stomach pain gets better. Check with your doctor or health care professional if your condition does not start to get better, or if it gets worse. You may need blood work done while you are taking this medicine. What side effects may I notice from receiving this medicine? Side effects that you should report to your doctor or health care professional as soon as possible: -allergic reactions like skin rash, itching or hives, swelling of the face, lips, or tongue -bone, muscle or joint pain -breathing problems -chest pain or chest tightness -dark yellow or brown urine -dizziness -fast, irregular heartbeat -feeling faint or lightheaded -fever or sore throat -muscle spasm -palpitations -redness, blistering, peeling or loosening of the skin, including inside the mouth -seizures -tremors -unusual bleeding or bruising -unusually weak or tired -yellowing of the eyes or skin Side effects that usually do not require medical attention (Report these to your doctor or health care professional if they continue or are bothersome.): -constipation -diarrhea -dry mouth -headache -nausea This list may not describe all possible side effects. Call your doctor for medical advice about side effects. You may report side  effects to FDA at 1-800-FDA-1088. Where should I keep my  medicine? Keep out of the reach of children. Store at room temperature between 15 and 30 degrees C (59 and 86 degrees F). Protect from light and moisture. Throw away any unused medicine after the expiration date. NOTE: This sheet is a summary. It may not cover all possible information. If you have questions about this medicine, talk to your doctor, pharmacist, or health care provider.  2015, Elsevier/Gold Standard. (2011-11-14 16:40:16)  Ondansetron tablets What is this medicine? ONDANSETRON (on DAN se tron) is used to treat nausea and vomiting caused by chemotherapy. It is also used to prevent or treat nausea and vomiting after surgery. This medicine may be used for other purposes; ask your health care provider or pharmacist if you have questions. COMMON BRAND NAME(S): Zofran What should I tell my health care provider before I take this medicine? They need to know if you have any of these conditions: -heart disease -history of irregular heartbeat -liver disease -low levels of magnesium or potassium in the blood -an unusual or allergic reaction to ondansetron, granisetron, other medicines, foods, dyes, or preservatives -pregnant or trying to get pregnant -breast-feeding How should I use this medicine? Take this medicine by mouth with a glass of water. Follow the directions on your prescription label. Take your doses at regular intervals. Do not take your medicine more often than directed. Talk to your pediatrician regarding the use of this medicine in children. Special care may be needed. Overdosage: If you think you have taken too much of this medicine contact a poison control center or emergency room at once. NOTE: This medicine is only for you. Do not share this medicine with others. What if I miss a dose? If you miss a dose, take it as soon as you can. If it is almost time for your next dose, take only that dose. Do not take double or extra doses. What may interact with this  medicine? Do not take this medicine with any of the following medications: -apomorphine -certain medicines for fungal infections like fluconazole, itraconazole, ketoconazole, posaconazole, voriconazole -cisapride -dofetilide -dronedarone -pimozide -thioridazine -ziprasidone This medicine may also interact with the following medications: -carbamazepine -certain medicines for depression, anxiety, or psychotic disturbances -fentanyl -linezolid -MAOIs like Carbex, Eldepryl, Marplan, Nardil, and Parnate -methylene blue (injected into a vein) -other medicines that prolong the QT interval (cause an abnormal heart rhythm) -phenytoin -rifampicin -tramadol This list may not describe all possible interactions. Give your health care provider a list of all the medicines, herbs, non-prescription drugs, or dietary supplements you use. Also tell them if you smoke, drink alcohol, or use illegal drugs. Some items may interact with your medicine. What should I watch for while using this medicine? Check with your doctor or health care professional right away if you have any sign of an allergic reaction. What side effects may I notice from receiving this medicine? Side effects that you should report to your doctor or health care professional as soon as possible: -allergic reactions like skin rash, itching or hives, swelling of the face, lips or tongue -breathing problems -confusion -dizziness -fast or irregular heartbeat -feeling faint or lightheaded, falls -fever and chills -loss of balance or coordination -seizures -sweating -swelling of the hands or feet -tightness in the chest -tremors -unusually weak or tired Side effects that usually do not require medical attention (report to your doctor or health care professional if they continue or are bothersome): -constipation or diarrhea -headache This list  may not describe all possible side effects. Call your doctor for medical advice about side  effects. You may report side effects to FDA at 1-800-FDA-1088. Where should I keep my medicine? Keep out of the reach of children. Store between 2 and 30 degrees C (36 and 86 degrees F). Throw away any unused medicine after the expiration date. NOTE: This sheet is a summary. It may not cover all possible information. If you have questions about this medicine, talk to your doctor, pharmacist, or health care provider.  2015, [employer]/Gold Standard. (2012-10-22 16:27:45)  Acetaminophen; Oxycodone tablets What is this medicine? ACETAMINOPHEN; OXYCODONE (a set a MEE noe fen; ox i KOE done) is a pain reliever. It is used to treat mild to moderate pain. This medicine may be used for other purposes; ask your health care provider or pharmacist if you have questions. COMMON BRAND NAME(S): Endocet, Magnacet, Narvox, Percocet, Perloxx, Primalev, Primlev, Roxicet, Xolox What should I tell my health care provider before I take this medicine? They need to know if you have any of these conditions: -brain tumor -Crohn's disease, inflammatory bowel disease, or ulcerative colitis -drug abuse or addiction -head injury -heart or circulation problems -if you often drink alcohol -kidney disease or problems going to the bathroom -liver disease -lung disease, asthma, or breathing problems -an unusual or allergic reaction to acetaminophen, oxycodone, other opioid analgesics, other medicines, foods, dyes, or preservatives -pregnant or trying to get pregnant -breast-feeding How should I use this medicine? Take this medicine by mouth with a full glass of water. Follow the directions on the prescription label. Take your medicine at regular intervals. Do not take your medicine more often than directed. Talk to your pediatrician regarding the use of this medicine in children. Special care may be needed. Patients over 64 years old may have a stronger reaction and need a smaller dose. Overdosage: If you think you have  taken too much of this medicine contact a poison control center or emergency room at once. NOTE: This medicine is only for you. Do not share this medicine with others. What if I miss a dose? If you miss a dose, take it as soon as you can. If it is almost time for your next dose, take only that dose. Do not take double or extra doses. What may interact with this medicine? -alcohol -antihistamines -barbiturates like amobarbital, butalbital, butabarbital, methohexital, pentobarbital, phenobarbital, thiopental, and secobarbital -benztropine -drugs for bladder problems like solifenacin, trospium, oxybutynin, tolterodine, hyoscyamine, and methscopolamine -drugs for breathing problems like ipratropium and tiotropium -drugs for certain stomach or intestine problems like propantheline, homatropine methylbromide, glycopyrrolate, atropine, belladonna, and dicyclomine -general anesthetics like etomidate, ketamine, nitrous oxide, propofol, desflurane, enflurane, halothane, isoflurane, and sevoflurane -medicines for depression, anxiety, or psychotic disturbances -medicines for sleep -muscle relaxants -naltrexone -narcotic medicines (opiates) for pain -phenothiazines like perphenazine, thioridazine, chlorpromazine, mesoridazine, fluphenazine, prochlorperazine, promazine, and trifluoperazine -scopolamine -tramadol -trihexyphenidyl This list may not describe all possible interactions. Give your health care provider a list of all the medicines, herbs, non-prescription drugs, or dietary supplements you use. Also tell them if you smoke, drink alcohol, or use illegal drugs. Some items may interact with your medicine. What should I watch for while using this medicine? Tell your doctor or health care professional if your pain does not go away, if it gets worse, or if you have new or a different type of pain. You may develop tolerance to the medicine. Tolerance means that you will need a higher dose of the  medication  for pain relief. Tolerance is normal and is expected if you take this medicine for a long time. Do not suddenly stop taking your medicine because you may develop a severe reaction. Your body becomes used to the medicine. This does NOT mean you are addicted. Addiction is a behavior related to getting and using a drug for a non-medical reason. If you have pain, you have a medical reason to take pain medicine. Your doctor will tell you how much medicine to take. If your doctor wants you to stop the medicine, the dose will be slowly lowered over time to avoid any side effects. You may get drowsy or dizzy. Do not drive, use machinery, or do anything that needs mental alertness until you know how this medicine affects you. Do not stand or sit up quickly, especially if you are an older patient. This reduces the risk of dizzy or fainting spells. Alcohol may interfere with the effect of this medicine. Avoid alcoholic drinks. There are different types of narcotic medicines (opiates) for pain. If you take more than one type at the same time, you may have more side effects. Give your health care provider a list of all medicines you use. Your doctor will tell you how much medicine to take. Do not take more medicine than directed. Call emergency for help if you have problems breathing. The medicine will cause constipation. Try to have a bowel movement at least every 2 to 3 days. If you do not have a bowel movement for 3 days, call your doctor or health care professional. Do not take Tylenol (acetaminophen) or medicines that have acetaminophen with this medicine. Too much acetaminophen can be very dangerous. Many nonprescription medicines contain acetaminophen. Always read the labels carefully to avoid taking more acetaminophen. What side effects may I notice from receiving this medicine? Side effects that you should report to your doctor or health care professional as soon as possible: -allergic reactions like  skin rash, itching or hives, swelling of the face, lips, or tongue -breathing difficulties, wheezing -confusion -light headedness or fainting spells -severe stomach pain -unusually weak or tired -yellowing of the skin or the whites of the eyes Side effects that usually do not require medical attention (report to your doctor or health care professional if they continue or are bothersome): -dizziness -drowsiness -nausea -vomiting This list may not describe all possible side effects. Call your doctor for medical advice about side effects. You may report side effects to FDA at 1-800-FDA-1088. Where should I keep my medicine? Keep out of the reach of children. This medicine can be abused. Keep your medicine in a safe place to protect it from theft. Do not share this medicine with anyone. Selling or giving away this medicine is dangerous and against the law. Store at room temperature between 20 and 25 degrees C (68 and 77 degrees F). Keep container tightly closed. Protect from light. This medicine may cause accidental overdose and death if it is taken by other adults, children, or pets. Flush any unused medicine down the toilet to reduce the chance of harm. Do not use the medicine after the expiration date. NOTE: This sheet is a summary. It may not cover all possible information. If you have questions about this medicine, talk to your doctor, pharmacist, or health care provider.  2015, [employer]vier/Gold Standard. (2012-09-08 13:17:35)

## 2014-08-28 NOTE — ED Notes (Signed)
Patient urinated in bed, stated she has weak bladder after having child. I changed linens as patient went to restroom. She gave urine sample, I gave her paper scrub pants and clean gown.

## 2014-08-28 NOTE — ED Provider Notes (Signed)
CSN: [id number]     Arrival date & time 08/28/14  0125 History   First MD Initiated Contact with Patient 08/28/14 0221     Chief Complaint  Patient presents with  . Shortness of Breath     (Consider location/radiation/quality/duration/timing/severity/associated sxs/prior Treatment) Patient is a 34 y.o. female presenting with shortness of breath. The history is provided by the patient.  Shortness of Breath She has a history of idiopathic pancreatitis. She noted nausea when she went to bed. She woke up twice with pain in the lower sternal/epigastric area with radiation to the back. This is associated with the feeling that she is having difficulty catching her breath. She continues to have intense nausea but no vomiting. She denies diaphoresis. Pain is rated at 7/10. Pain is similar to what she had with her episode of pancreatitis. She states that she is a nondrinker. Pain does seem to be better when she is sitting up and worse when she lays flat. She has not noticed anything else makes a difference.  Past Medical History  Diagnosis Date  . Interstitial cystitis   . Tachycardia   . Heart valve regurgitation   . Pancreatitis    Past Surgical History  Procedure Laterality Date  . Ovarian cyst removal    . Appendectomy     Family History  Problem Relation Age of Onset  . Breast cancer Maternal Grandmother   . Breast cancer Paternal Grandmother   . Diabetes Paternal Grandmother   . Diabetes Father   . Diabetes Father   . Hypertension Father    History  Substance Use Topics  . Smoking status: Never Smoker   . Smokeless tobacco: Never Used  . Alcohol Use: No   OB History    No data available     Review of Systems  Respiratory: Positive for shortness of breath.   All other systems reviewed and are negative.     Allergies  Review of patient's allergies indicates no known allergies.  Home Medications   Prior to Admission medications   Not on File   BP 129/69 mmHg   Pulse 100  Temp(Src) 98.2 F (36.8 C) (Oral)  Resp 20  Ht  (1.575 m)  Wt 200 lb (90.719 kg)  BMI 36.57 kg/m2  SpO2 100% Physical Exam  Nursing note and vitals reviewed.  34 year old female, resting comfortably and in no acute distress. Vital signs are normal. Oxygen saturation is 100%, which is normal. Head is normocephalic and atraumatic. PERRLA, EOMI. Oropharynx is clear. Neck is nontender and supple without adenopathy or JVD. Back is nontender and there is no CVA tenderness. Lungs are clear without rales, wheezes, or rhonchi. Chest is nontender. Heart has regular rate and rhythm without murmur. Abdomen is soft, flat, with moderate tenderness across the upper abdomen and moderate to severe tenderness in the epigastric area. There is no rebound or guarding. There are no masses or hepatosplenomegaly and peristalsis is hypoactive. Extremities have no cyanosis or edema, full range of motion is present. Skin is warm and dry without rash. Neurologic: Mental status is normal, cranial nerves are intact, there are no motor or sensory deficits.  ED Course  Procedures (including critical care time) Labs Review Results for orders placed or performed during the hospital encounter of 08/28/14  Basic metabolic panel  Result Value Ref Range   Sodium 139 135 - 145 mmol/L   Potassium 3.5 3.5 - 5.1 mmol/L   Chloride 105 101 - 111 mmol/L   CO2  24 22 - 32 mmol/L   Glucose, Bld 116 (H) 65 - 99 mg/dL   BUN 8 6 - 20 mg/dL   Creatinine, Ser 1.61 0.44 - 1.00 mg/dL   Calcium 8.9 8.9 - 09.6 mg/dL   GFR calc non Af Amer >60 >60 mL/min   GFR calc Af Amer >60 >60 mL/min   Anion gap 10 5 - 15  CBC  Result Value Ref Range   WBC 9.2 4.0 - 10.5 K/uL   RBC 4.85 3.87 - 5.11 MIL/uL   Hemoglobin 12.5 12.0 - 15.0 g/dL   HCT 04.5 40.9 - 81.1 %   MCV 79.6 78.0 - 100.0 fL   MCH 25.8 (L) 26.0 - 34.0 pg   MCHC 32.4 30.0 - 36.0 g/dL   RDW 91.4 78.2 - 95.6 %   Platelets 314 150 - 400 K/uL  Troponin I   Result Value Ref Range   Troponin I <0.03 <0.031 ng/mL  Hepatic function panel  Result Value Ref Range   Total Protein 8.0 6.5 - 8.1 g/dL   Albumin 3.8 3.5 - 5.0 g/dL   AST 22 15 - 41 U/L   ALT 17 14 - 54 U/L   Alkaline Phosphatase 113 38 - 126 U/L   Total Bilirubin 0.4 0.3 - 1.2 mg/dL   Bilirubin, Direct <2.1 (L) 0.1 - 0.5 mg/dL   Indirect Bilirubin NOT CALCULATED 0.3 - 0.9 mg/dL  Lipase, blood  Result Value Ref Range   Lipase 46 22 - 51 U/L   Imaging Review Dg Chest 2 View  08/28/2014   CLINICAL DATA:  Lower sternal chest pain, slightly left of midline. Initial encounter.  EXAM: CHEST  2 VIEW  COMPARISON:  03/16/2014  FINDINGS: The heart size and mediastinal contours are within normal limits. Both lungs are clear. The visualized skeletal structures are unremarkable.  IMPRESSION: No active cardiopulmonary disease.   Electronically Signed   By: Ellery Plunk M.D.   On: 08/28/2014 02:16     EKG Interpretation   Date/Time:  Saturday August 28 2014 01:42:45 EDT Ventricular Rate:  93 PR Interval:  134 QRS Duration: 86 QT Interval:  342 QTC Calculation: 425 R Axis:   21 Text Interpretation:  Normal sinus rhythm Septal infarct , age  undetermined Abnormal ECG When compared with ECG of 03/16/2014, No  significant change was found Confirmed by [facility]  MD, Tieler Cournoyer ([id number]) on  08/28/2014 1:45:48 AM      MDM   Final diagnoses:  Upper abdominal pain    Epigastric pain consistent with pancreatitis. Also possibly consistent with GERD or peptic ulcer disease. She'll be given therapeutic trial of GI cocktail and IV pantoprazole. Old records reviewed confirming she had an ED visit in February for pancreatitis and evaluation by gastroenterologist included normal CT scan of abdomen and pelvis and normal nuclear biliary scan.  She had temporary relief with GI cocktail but pain has recurred. Laboratory workup is unremarkable including normal lipase. On review of old records, her lipase was  only minimally elevated on prior visit and I question whether she had she had pancreatitis. At this point, I suspect this is either peptic ulcer disease or gastroesophageal reflux disease. She is discharged with prescription for pantoprazole, and also ondansetron and oxycodone-acetaminophen. She is referred back to her gastroenterologist for further workup.  Dione Booze, MD 08/28/14 [phone]

## 2014-08-28 NOTE — ED Notes (Signed)
Called into patient's room.  Patient itching and hives after receiving Morphine.  Order received for Benadryl.

## 2014-10-07 ENCOUNTER — Ambulatory Visit: Payer: Self-pay | Admitting: Family Medicine

## 2014-10-20 ENCOUNTER — Encounter: Payer: Self-pay | Admitting: Neurology

## 2014-10-20 ENCOUNTER — Ambulatory Visit (INDEPENDENT_AMBULATORY_CARE_PROVIDER_SITE_OTHER): Payer: BLUE CROSS/BLUE SHIELD | Admitting: Neurology

## 2014-10-20 VITALS — BP 108/72 | HR 80 | Resp 16 | Ht 62.0 in | Wt 200.0 lb

## 2014-10-20 DIAGNOSIS — R51 Headache: Secondary | ICD-10-CM | POA: Diagnosis not present

## 2014-10-20 DIAGNOSIS — R519 Headache, unspecified: Secondary | ICD-10-CM

## 2014-10-20 DIAGNOSIS — G4719 Other hypersomnia: Secondary | ICD-10-CM

## 2014-10-20 DIAGNOSIS — G2581 Restless legs syndrome: Secondary | ICD-10-CM | POA: Diagnosis not present

## 2014-10-20 DIAGNOSIS — E669 Obesity, unspecified: Secondary | ICD-10-CM | POA: Diagnosis not present

## 2014-10-20 DIAGNOSIS — R0683 Snoring: Secondary | ICD-10-CM | POA: Diagnosis not present

## 2014-10-20 DIAGNOSIS — G4761 Periodic limb movement disorder: Secondary | ICD-10-CM

## 2014-10-20 NOTE — Progress Notes (Signed)
Subjective:    Patient ID: Laura Shah is a 34 y.o. female.  HPI     Huston Foley, MD, PhD [facility] [address], [address] [address] [place], [place] [address]  Dear Dr. Helmut Muster,   I saw your patient, Laura Shah, upon your kind request, in my neurologic clinic today for initial consultation of her sleep disorder, in particular, concern for obstructive sleep apnea. The patient is unaccompanied today. As you know, Laura Shah is a 34 year old right-handed woman with an underlying medical history of pancreatitis, interstitial cystitis and obesity, who reports snoring and excessive daytime somnolence. She reports waking up with a sense of gasping for air. She wakes up with a panic sensation. She has morning headaches frequently, about 4-5 times per week, describing these as bifrontal and dull and achy, no nausea or photophobia, no throbbing sensation. She sometimes has trouble going to sleep and maintaining sleep. In the past, several years ago she was tried on Ambien but had parasomnias including sleepwalking and making phone calls in her sleep. She has gained weight with time. 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 She does not smoke. She does not drink alcohol. She drinks 1-2 cans of soda per day, usually before lunch or with lunch. She lives with her 2 children ages 33 and 20. She reports no family history of obstructive sleep apnea. She has occasional nocturia. She has no lower extremity edema. She feels that her memory is not as good. Her bedtime is around 9 PM, rise time is around 5:30 AM to 5:45 AM. She does not wake up rested. Her Epworth sleepiness score is 19 out of 24, fatigue score is 61 out of 63 today. She went to the emergency room on 08/28/2014 for abdominal pain and was diagnosed with pancreatitis. She is supposed to follow-up with her GI doctor. I reviewed the emergency room records. She reports being a restless sleeper. She has occasional restless leg  symptoms. She moves her legs in her sleep.  Her Past Medical History Is Significant For: Past Medical History  Diagnosis Date  . Interstitial cystitis   . Tachycardia   . Heart valve regurgitation   . Pancreatitis   . Anxiety     Her Past Surgical History Is Significant For: Past Surgical History  Procedure Laterality Date  . Ovarian cyst removal    . Appendectomy      Her Family History Is Significant For: Family History  Problem Relation Age of Onset  . Breast cancer Maternal Grandmother   . Breast cancer Paternal Grandmother   . Diabetes Paternal Grandmother   . Diabetes Father   . Diabetes Father   . Hypertension Father     Her Social History Is Significant For: Social History   Social History  . Marital Status: Single    Spouse Name: N/A  . Number of Children: 2  . Years of Education: Grad    Social History Main Topics  . Smoking status: Never Smoker   . Smokeless tobacco: Never Used  . Alcohol Use: No  . Drug Use: No  . Sexual Activity: Yes    Birth Control/ Protection: IUD   Other Topics Concern  . None   Social History Narrative   Drinks about 1-2 cans of soda a day     Her Allergies Are:  Allergies  Allergen Reactions  . Morphine And Related Itching  :   Her Current Medications Are:  Outpatient Encounter Prescriptions as of 10/20/2014  Medication Sig  . citalopram (CELEXA) 10 MG tablet Take 10 mg by mouth daily.  . ondansetron (ZOFRAN) 4 MG tablet Take 1 tablet (4 mg total) by mouth every 6 (six) hours as needed for nausea or vomiting.  . pantoprazole (PROTONIX) 40 MG tablet Take 1 tablet (40 mg total) by mouth daily.  . [DISCONTINUED] oxyCODONE-acetaminophen (PERCOCET) 5-325 MG per tablet Take 1 tablet by mouth every 4 (four) hours as needed for moderate pain.   No facility-administered encounter medications on file as of 10/20/2014.  :  Review of Systems:  Out of a complete 14 point review of systems, all are reviewed and negative with  the exception of these symptoms as listed below:   Review of Systems  Constitutional: Positive for fatigue.       Weight gain   Respiratory: Positive for shortness of breath.        Snoring   Genitourinary: Positive for dysuria.       Incontinence   Allergic/Immunologic: Positive for environmental allergies.  Neurological: Positive for dizziness, weakness and headaches.       Memory loss, sleepiness.   Trouble falling and staying asleep, wakes up choking, snoring, morning headaches, wakes up feeling tired, daytime tiredness, denies taking regular naps. Falls asleep at home after work.   Psychiatric/Behavioral:       Decreased energy anxiety    Objective:  Neurologic Exam  Physical Exam Physical Examination:   Filed Vitals:   10/20/14 1440  BP: 108/72  Pulse: 80  Resp: 16   General Examination: The patient is a very pleasant 34 y.o. female in no acute distress. She appears well-developed and well-nourished and adequately groomed.   HEENT: Normocephalic, atraumatic, pupils are equal, round and reactive to light and accommodation. Funduscopic exam is normal with sharp disc margins noted. Extraocular tracking is good without limitation to gaze excursion or nystagmus noted. Normal smooth pursuit is noted. Hearing is grossly intact. Tympanic membranes are clear bilaterally. Face is symmetric with normal facial animation and normal facial sensation. Speech is clear with no dysarthria noted. There is no hypophonia. There is no lip, neck/head, jaw or voice tremor. Neck is supple with full range of passive and active motion. There are no carotid bruits on auscultation. Oropharynx exam reveals: mild mouth dryness, good dental hygiene and moderate airway crowding, due to  significantly narrow appearing airway, tonsils of 2+, normal uvula and elongated appearing tongue. Mallampati is class II. Neck circumference is 15-1/2 inches. She has a mild overbite.  Chest: Clear to auscultation without  wheezing, rhonchi or crackles noted.  Heart: S1+S2+0, regular and normal without murmurs, rubs or gallops noted.   Abdomen: Soft, non-tender and non-distended with normal bowel sounds appreciated on auscultation.  Extremities: There is no pitting edema in the distal lower extremities bilaterally. Pedal pulses are intact.  Skin: Warm and dry without trophic changes noted. There are no varicose veins.  Musculoskeletal: exam reveals no obvious joint deformities, tenderness or joint swelling or erythema.   Neurologically:  Mental status: The patient is awake, alert and oriented in all 4 spheres. Her immediate and remote memory, attention, language skills and fund of knowledge are appropriate. There is no evidence of aphasia, agnosia, apraxia or anomia. Speech is clear with normal prosody and enunciation. Thought process is linear. Mood is normal and affect is normal.  Cranial nerves II - XII are as described above under HEENT exam. In addition: shoulder shrug is normal with equal shoulder height noted. Motor exam: Normal bulk,  strength and tone is noted. There is no drift, resting tremor or rebound. She has a very subtle bilateral upper extremity postural and action tremor. Romberg is negative. Reflexes are 2+ throughout. Babinski: Toes are flexor bilaterally. Fine motor skills and coordination: intact with normal finger taps, normal hand movements, normal rapid alternating patting, normal foot taps and normal foot agility.  Cerebellar testing: No dysmetria or intention tremor on finger to nose testing. Heel to shin is unremarkable bilaterally. There is no truncal or gait ataxia.  Sensory exam: intact to light touch, pinprick, vibration, temperature sense in the upper and lower extremities.  Gait, station and balance: She stands easily. No veering to one side is noted. No leaning to one side is noted. Posture is age-appropriate and stance is narrow based. Gait shows normal stride length and normal  pace. No problems turning are noted. She turns en bloc. Tandem walk is unremarkable.  Assessment and Plan:   In summary, Laura Shah is a very pleasant 34 y.o.-year old female with an underlying medical history of pancreatitis, interstitial cystitis and obesity, who reports snoring and excessive daytime somnolence. She reports waking up with a sense of gasping frequently. She also reports frequent morning headaches. Her history and physical exam are concerning for underlying obstructive sleep apnea (OSA). In addition, she endorses restless leg symptoms and PLMD. On examination she also has a very subtle upper extremity tremor, this could be a very mild form of essential tremor, medication induced tremor, or physiologic enhanced tremor. We will monitor this.  I had a long chat with the patient about my findings and the diagnosis of OSA, its prognosis and treatment options. We talked about medical treatments, surgical interventions and non-pharmacological approaches. I explained in particular the risks and ramifications of untreated moderate to severe OSA, especially with respect to developing cardiovascular disease down the Road, including congestive heart failure, difficult to treat hypertension, cardiac arrhythmias, or stroke. Even type 2 diabetes has, in part, been linked to untreated OSA. Symptoms of untreated OSA include daytime sleepiness, memory problems, mood irritability and mood disorder such as depression and anxiety, lack of energy, as well as recurrent headaches, especially morning headaches. We talked about trying to maintain a  healthy lifestyle in general, as well as the importance of weight control. I encouraged the patient to eat healthy, exercise daily and keep well hydrated, to keep a scheduled bedtime and wake time routine, to not skip any meals and eat healthy snacks in between meals. I advised the patient not to drive when feeling sleepy. I recommended the following at this time:  sleep study with potential positive airway pressure titration. (We will score hypopneas at 3% and split the sleep study into diagnostic and treatment portion, if the estimated. 2 hour AHI is >15/h).   I explained the sleep test procedure to the patient and also outlined possible surgical and non-surgical treatment options of OSA, including the use of a custom-made dental device (which would require a referral to a specialist dentist or oral surgeon), upper airway surgical options, such as pillar implants, radiofrequency surgery, tongue base surgery, and UPPP (which would involve a referral to an ENT surgeon). Rarely, jaw surgery such as mandibular advancement may be considered.  I also explained the CPAP treatment option to the patient, who indicated that she would be willing to try CPAP if the need arises. I explained the importance of being compliant with PAP treatment, not only for insurance purposes but primarily to improve Her symptoms, and  for the patient's long term health benefit, including to reduce Her cardiovascular risks. I answered all her questions today and the patient was in agreement. I would like to see her back after the sleep study is completed and encouraged her to call with any interim questions, concerns, problems or updates.   Thank you very much for allowing me to participate in the care of this nice patient. If I can be of any further assistance to you please do not hesitate to call me at [phone].  Sincerely,   Huston Foley, MD, PhD

## 2014-10-20 NOTE — Patient Instructions (Signed)

## 2014-11-12 ENCOUNTER — Ambulatory Visit (INDEPENDENT_AMBULATORY_CARE_PROVIDER_SITE_OTHER): Payer: BLUE CROSS/BLUE SHIELD | Admitting: Neurology

## 2014-11-12 DIAGNOSIS — G4733 Obstructive sleep apnea (adult) (pediatric): Secondary | ICD-10-CM

## 2014-11-12 DIAGNOSIS — Z8669 Personal history of other diseases of the nervous system and sense organs: Secondary | ICD-10-CM

## 2014-11-12 DIAGNOSIS — G4720 Circadian rhythm sleep disorder, unspecified type: Secondary | ICD-10-CM

## 2014-11-12 DIAGNOSIS — G4710 Hypersomnia, unspecified: Secondary | ICD-10-CM

## 2014-11-12 HISTORY — DX: Personal history of other diseases of the nervous system and sense organs: Z86.69

## 2014-11-12 NOTE — Sleep Study (Signed)
Please see the scanned sleep study interpretation located in the procedure tab in the chart view section.  

## 2014-11-18 ENCOUNTER — Telehealth: Payer: Self-pay | Admitting: Neurology

## 2014-11-18 NOTE — Telephone Encounter (Signed)
Left message for patient to call back for sleep study results.  

## 2014-11-18 NOTE — Telephone Encounter (Signed)
Patient referred by Dr. Helmut MusterFlynn (dentist?), seen by me on 10/20/14, diagnostic PSG on 11/12/14.   Please call and notify the patient that the recent sleep study did not show any significant obstructive sleep apnea. Borderline sleep apnea only during supine REM sleep, therefore, weight loss and avoiding the supine sleep position will help, no need for CPAP. Please inform patient that I can see in FU if desired, we can also play it by ear. Please ask her to maintain good sleep hygiene, which means: Keep a regular sleep and wake schedule, try not to exercise or have a meal within 2 hours of your bedtime, try to keep your bedroom conducive for sleep, that is, cool and dark, without light distractors such as an illuminated alarm clock, and refrain from watching TV right before sleep or in the middle of the night and do not keep the TV or radio on during the night. Also, try not to use or play on electronic devices at bedtime, such as your cell phone, tablet PC or laptop. If you like to read at bedtime on an electronic device, try to dim the background light as much as possible. Do not eat in the middle of the night.  She can FU with Dr. Helmut MusterFlynn as scheduled and PCP. Also, route or fax report to PCP and referring MD, if other than PCP.  Once you have spoken to patient, you can close this encounter.   Thanks,  Huston FoleySaima Sol Englert, MD, PhD [facility], [facility], [facility]

## 2014-11-18 NOTE — Telephone Encounter (Signed)
I spoke to patient and gave results and recommendation. Patient would like to see Dr. Frances FurbishAthar in a f/u. Laura Shah states that she does wake up gasping for air and it is scary to her. She was unable to make appt at this time but will call back to schedule. I will fax report to PCP and mail patient a list of sleep hygiene pointers.

## 2015-01-27 HISTORY — PX: LAPAROSCOPIC GASTRECTOMY: SHX5894

## 2015-02-05 DIAGNOSIS — Z86718 Personal history of other venous thrombosis and embolism: Secondary | ICD-10-CM

## 2015-02-05 HISTORY — DX: Personal history of other venous thrombosis and embolism: Z86.718

## 2015-03-15 ENCOUNTER — Emergency Department (HOSPITAL_BASED_OUTPATIENT_CLINIC_OR_DEPARTMENT_OTHER): Payer: BLUE CROSS/BLUE SHIELD

## 2015-03-15 ENCOUNTER — Inpatient Hospital Stay (HOSPITAL_BASED_OUTPATIENT_CLINIC_OR_DEPARTMENT_OTHER)
Admission: EM | Admit: 2015-03-15 | Discharge: 2015-03-19 | DRG: 391 | Disposition: A | Discharge status: Home / Self Care | Payer: BLUE CROSS/BLUE SHIELD | Attending: Internal Medicine | Admitting: Internal Medicine

## 2015-03-15 ENCOUNTER — Telehealth: Payer: Self-pay | Admitting: Gastroenterology

## 2015-03-15 ENCOUNTER — Encounter (HOSPITAL_BASED_OUTPATIENT_CLINIC_OR_DEPARTMENT_OTHER): Payer: Self-pay | Admitting: *Deleted

## 2015-03-15 DIAGNOSIS — K859 Acute pancreatitis without necrosis or infection, unspecified: Secondary | ICD-10-CM | POA: Diagnosis present

## 2015-03-15 DIAGNOSIS — F419 Anxiety disorder, unspecified: Secondary | ICD-10-CM | POA: Diagnosis present

## 2015-03-15 DIAGNOSIS — K5901 Slow transit constipation: Secondary | ICD-10-CM | POA: Insufficient documentation

## 2015-03-15 DIAGNOSIS — K5900 Constipation, unspecified: Secondary | ICD-10-CM | POA: Diagnosis present

## 2015-03-15 DIAGNOSIS — Z975 Presence of (intrauterine) contraceptive device: Secondary | ICD-10-CM

## 2015-03-15 DIAGNOSIS — M79604 Pain in right leg: Secondary | ICD-10-CM

## 2015-03-15 DIAGNOSIS — R29898 Other symptoms and signs involving the musculoskeletal system: Secondary | ICD-10-CM | POA: Insufficient documentation

## 2015-03-15 DIAGNOSIS — D649 Anemia, unspecified: Secondary | ICD-10-CM

## 2015-03-15 DIAGNOSIS — K828 Other specified diseases of gallbladder: Secondary | ICD-10-CM | POA: Diagnosis present

## 2015-03-15 DIAGNOSIS — R7989 Other specified abnormal findings of blood chemistry: Secondary | ICD-10-CM | POA: Insufficient documentation

## 2015-03-15 DIAGNOSIS — Z7901 Long term (current) use of anticoagulants: Secondary | ICD-10-CM

## 2015-03-15 DIAGNOSIS — K581 Irritable bowel syndrome with constipation: Secondary | ICD-10-CM | POA: Insufficient documentation

## 2015-03-15 DIAGNOSIS — K8591 Acute pancreatitis with uninfected necrosis, unspecified: Secondary | ICD-10-CM

## 2015-03-15 DIAGNOSIS — R1013 Epigastric pain: Secondary | ICD-10-CM | POA: Diagnosis not present

## 2015-03-15 DIAGNOSIS — R945 Abnormal results of liver function studies: Secondary | ICD-10-CM

## 2015-03-15 DIAGNOSIS — Z9884 Bariatric surgery status: Secondary | ICD-10-CM

## 2015-03-15 DIAGNOSIS — E876 Hypokalemia: Secondary | ICD-10-CM

## 2015-03-15 DIAGNOSIS — R1011 Right upper quadrant pain: Secondary | ICD-10-CM | POA: Diagnosis not present

## 2015-03-15 DIAGNOSIS — I81 Portal vein thrombosis: Secondary | ICD-10-CM

## 2015-03-15 DIAGNOSIS — N3010 Interstitial cystitis (chronic) without hematuria: Secondary | ICD-10-CM | POA: Diagnosis present

## 2015-03-15 HISTORY — DX: Ileus, unspecified: K56.7

## 2015-03-15 LAB — URINALYSIS, ROUTINE W REFLEX MICROSCOPIC
Glucose, UA: NEGATIVE mg/dL
Hgb urine dipstick: NEGATIVE
Ketones, ur: 40 mg/dL — AB
Nitrite: NEGATIVE
Protein, ur: 100 mg/dL — AB
Specific Gravity, Urine: 1.045 — ABNORMAL HIGH (ref 1.005–1.030)
pH: 6.5 (ref 5.0–8.0)

## 2015-03-15 LAB — CBC
HCT: 38.1 % (ref 36.0–46.0)
Hemoglobin: 11.9 g/dL — ABNORMAL LOW (ref 12.0–15.0)
MCH: 25.4 pg — ABNORMAL LOW (ref 26.0–34.0)
MCHC: 31.2 g/dL (ref 30.0–36.0)
MCV: 81.2 fL (ref 78.0–100.0)
Platelets: 223 10*3/uL (ref 150–400)
RBC: 4.69 MIL/uL (ref 3.87–5.11)
RDW: 15.9 % — ABNORMAL HIGH (ref 11.5–15.5)
WBC: 4.6 10*3/uL (ref 4.0–10.5)

## 2015-03-15 LAB — PREGNANCY, URINE: Preg Test, Ur: NEGATIVE

## 2015-03-15 LAB — COMPREHENSIVE METABOLIC PANEL
ALT: 68 U/L — ABNORMAL HIGH (ref 14–54)
AST: 67 U/L — ABNORMAL HIGH (ref 15–41)
Albumin: 3.9 g/dL (ref 3.5–5.0)
Alkaline Phosphatase: 105 U/L (ref 38–126)
Anion gap: 10 (ref 5–15)
BUN: 7 mg/dL (ref 6–20)
CO2: 26 mmol/L (ref 22–32)
Calcium: 9.2 mg/dL (ref 8.9–10.3)
Chloride: 106 mmol/L (ref 101–111)
Creatinine, Ser: 0.74 mg/dL (ref 0.44–1.00)
GFR calc Af Amer: >60 mL/min (ref >60)
GFR calc non Af Amer: >60 mL/min (ref >60)
Glucose, Bld: 92 mg/dL (ref 65–99)
Potassium: 3.2 mmol/L — ABNORMAL LOW (ref 3.5–5.1)
Sodium: 142 mmol/L (ref 135–145)
Total Bilirubin: 0.7 mg/dL (ref 0.3–1.2)
Total Protein: 7.5 g/dL (ref 6.5–8.1)

## 2015-03-15 LAB — URINE MICROSCOPIC-ADD ON

## 2015-03-15 LAB — LIPASE, BLOOD: Lipase: 93 U/L — ABNORMAL HIGH (ref 11–51)

## 2015-03-15 MED ORDER — ENOXAPARIN SODIUM 80 MG/0.8ML ~~LOC~~ SOLN
80.0000 mg | Freq: Once | SUBCUTANEOUS | Status: AC
  Administered 2015-03-15: 80 mg via SUBCUTANEOUS
  Filled 2015-03-15: qty 0.8

## 2015-03-15 MED ORDER — ONDANSETRON 4 MG PO TBDP
4.0000 mg | ORAL_TABLET | Freq: Once | ORAL | Status: AC | PRN
  Administered 2015-03-15: 4 mg via ORAL
  Filled 2015-03-15: qty 1

## 2015-03-15 MED ORDER — METOCLOPRAMIDE HCL 5 MG/ML IJ SOLN
10.0000 mg | Freq: Once | INTRAMUSCULAR | Status: DC
  Filled 2015-03-15: qty 2

## 2015-03-15 MED ORDER — PROMETHAZINE HCL 25 MG/ML IJ SOLN
12.5000 mg | Freq: Once | INTRAMUSCULAR | Status: AC
  Administered 2015-03-15: 12.5 mg via INTRAVENOUS
  Filled 2015-03-15: qty 1

## 2015-03-15 MED ORDER — IOHEXOL 300 MG/ML  SOLN
25.0000 mL | Freq: Once | INTRAMUSCULAR | Status: AC | PRN
  Administered 2015-03-15: 25 mL via ORAL

## 2015-03-15 MED ORDER — IOHEXOL 300 MG/ML  SOLN
100.0000 mL | Freq: Once | INTRAMUSCULAR | Status: AC | PRN
Start: 2015-03-15 — End: 2015-03-15
  Administered 2015-03-15: 100 mL via INTRAVENOUS

## 2015-03-15 MED ORDER — HYDROMORPHONE HCL 1 MG/ML IJ SOLN
1.0000 mg | Freq: Once | INTRAMUSCULAR | Status: AC
  Administered 2015-03-15: 1 mg via INTRAVENOUS
  Filled 2015-03-15: qty 1

## 2015-03-15 MED ORDER — SODIUM CHLORIDE 0.9 % IV BOLUS (SEPSIS)
2000.0000 mL | Freq: Once | INTRAVENOUS | Status: AC
  Administered 2015-03-15: 2000 mL via INTRAVENOUS

## 2015-03-15 MED ORDER — ONDANSETRON HCL 4 MG/2ML IJ SOLN
4.0000 mg | Freq: Once | INTRAMUSCULAR | Status: AC | PRN
  Administered 2015-03-15: 4 mg via INTRAVENOUS
  Filled 2015-03-15: qty 2

## 2015-03-15 NOTE — ED Provider Notes (Signed)
CSN: [id number]     Arrival date & time 03/15/15  1837 History    By signing my name below, I, Arlan Organ, attest that this documentation has been prepared under the direction and in the presence of Doug Sou, MD.  Electronically Signed: Arlan Organ, ED Scribe. 03/15/2015. 9:31 PM.   Chief Complaint  Patient presents with  . Abdominal Pain   The history is provided by the patient. No language interpreter was used.    HPI Comments: Laura Shah is a 35 y.o. female with a PMHx of pancreatitis and Ileus who presents to the Emergency Department complaining of constant, ongoing upper abdominal pain. Pt also reports ongoing nausea without vomiting. She states she is unable to keep any solid foods or liquid down at this time. She's not eaten in 2 days. Feels dehydrated No aggravating or alleviating factors. 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 A week later, pt was evaluated at [facility] Department for back pain, dehydration, and feeling lethargic. At that time, pt was admitted following portal vein thrombosis diagnosis. 5 days later, pt was readmitted for 7 days for SOB and ongoing abdominal pain. Pt was diagnosed with acute pancreatis. During hospitalization, pt had a Hydascan with plans to have her gall bladder removed. Last bowel movement 10 days ago. Last meal 2 days ago. Pt is currently treated with Lovenax and was recently on Coumadin. . No longer on Coumadin PSHx includes ovarian cyst removal, appendectomy, and gastrectomy.   PCP: Neldon Labella, MD    Past Medical History  Diagnosis Date  . Interstitial cystitis   . Tachycardia   . Heart valve regurgitation   . Pancreatitis   . Anxiety   . Ileus [facility])    Past Surgical History  Procedure Laterality Date  . Ovarian cyst removal    . Appendectomy    . Gastrectomy     Family History  Problem Relation Age of Onset  . Breast cancer Maternal Grandmother   .  Breast cancer Paternal Grandmother   . Diabetes Paternal Grandmother   . Diabetes Father   . Diabetes Father   . Hypertension Father    Social History  Substance Use Topics  . Smoking status: Never Smoker   . Smokeless tobacco: Never Used  . Alcohol Use: No   OB History    No data available     Review of Systems  Constitutional: Negative for fever and chills.  Respiratory: Negative for cough.   Cardiovascular: Negative for chest pain.  Gastrointestinal: Positive for nausea, abdominal pain and constipation.  Genitourinary:       Heavy menstrual bleeding for the past 2 days  Skin: Positive for rash.       "Bumps" on both legs for the past 2 days which itch.  Neurological: Negative for headaches.  Psychiatric/Behavioral: Negative for confusion.  All other systems reviewed and are negative.     Allergies  Morphine and related  Home Medications   Prior to Admission medications   Medication Sig Start Date End Date Taking? Authorizing Provider  Enoxaparin Sodium (LOVENOX ) Inject into the skin.   Yes Historical Provider, MD  citalopram (CELEXA) 10 MG tablet Take 10 mg by mouth daily.    Historical Provider, MD  ondansetron (ZOFRAN) 4 MG tablet Take 1 tablet (4 mg total) by mouth every 6 (six) hours as needed for nausea or vomiting. 08/28/14   Dione Booze, MD  pantoprazole (PROTONIX) 40 MG tablet Take  1 tablet (40 mg total) by mouth daily. 08/28/14   Dione Booze, MD   Triage Vitals: BP 118/68 mmHg  Pulse 81  Temp(Src) 97.8 F (36.6 C) (Oral)  Resp 18  Ht 5\' 1"  (1.549 m)  Wt 170 lb (77.111 kg)  BMI 32.14 kg/m2  SpO2 100%   Physical Exam  Constitutional: She appears well-developed and well-nourished. She appears distressed.  Appears moderately uncomfortable  HENT:  Head: Normocephalic and atraumatic.  Eyes: Conjunctivae are normal. Pupils are equal, round, and reactive to light.  Neck: Neck supple. No tracheal deviation present. No thyromegaly present.   Cardiovascular: Normal rate and regular rhythm.   No murmur heard. Pulmonary/Chest: Effort normal and breath sounds normal.  Abdominal: Soft. Bowel sounds are normal. She exhibits no distension. There is tenderness.  Diffusely tender. Multiple surgical scars  Musculoskeletal: Normal range of motion. She exhibits no edema or tenderness.  Neurological: She is alert. Coordination normal.  Skin: Skin is warm and dry. Rash noted.  3 dime-sized pinkish papules on left calf, pruritic, similar in appearance to mosquito bites. Otherwise without rash  Psychiatric: She has a normal mood and affect.  Nursing note and vitals reviewed.   ED Course  Procedures (including critical care time)  DIAGNOSTIC STUDIES: Oxygen Saturation is 100% on RA, Normal by my interpretation.    COORDINATION OF CARE: 9:14 PM- Will give Zofran. Will order Lipase, CMP, CBC, and urinalysis. Discussed treatment plan with pt at bedside and pt agreed to plan.     Labs Review Labs Reviewed  URINALYSIS, ROUTINE W REFLEX MICROSCOPIC (NOT AT [facility]) - Abnormal; Notable for the following:    Color, Urine ORANGE (*)    APPearance CLOUDY (*)    Specific Gravity, Urine 1.045 (*)    Bilirubin Urine MODERATE (*)    Ketones, ur 40 (*)    Protein, ur 100 (*)    Leukocytes, UA SMALL (*)    All other components within normal limits  URINE MICROSCOPIC-ADD ON - Abnormal; Notable for the following:    Squamous Epithelial / LPF 6-30 (*)    Bacteria, UA MANY (*)    All other components within normal limits  LIPASE, BLOOD  COMPREHENSIVE METABOLIC PANEL  CBC    Imaging Review No results found. I have personally reviewed and evaluated these images and lab results as part of my medical decision-making.   EKG Interpretation None      Results for orders placed or performed during the hospital encounter of 03/15/15  Urinalysis, Routine w reflex microscopic (not at [facility])  Result Value Ref Range   Color, Urine ORANGE (A) YELLOW    APPearance CLOUDY (A) CLEAR   Specific Gravity, Urine 1.045 (H) 1.005 - 1.030   pH 6.5 5.0 - 8.0   Glucose, UA NEGATIVE NEGATIVE mg/dL   Hgb urine dipstick NEGATIVE NEGATIVE   Bilirubin Urine MODERATE (A) NEGATIVE   Ketones, ur 40 (A) NEGATIVE mg/dL   Protein, ur 161 (A) NEGATIVE mg/dL   Nitrite NEGATIVE NEGATIVE   Leukocytes, UA SMALL (A) NEGATIVE  Urine microscopic-add on  Result Value Ref Range   Squamous Epithelial / LPF 6-30 (A) NONE SEEN   WBC, UA 6-30 0 - 5 WBC/hpf   RBC / HPF 0-5 0 - 5 RBC/hpf   Bacteria, UA MANY (A) NONE SEEN   Urine-Other MUCOUS PRESENT   Lipase, blood  Result Value Ref Range   Lipase 93 (H) 11 - 51 U/L  Comprehensive metabolic panel  Result Value Ref Range  Sodium 142 135 - 145 mmol/L   Potassium 3.2 (L) 3.5 - 5.1 mmol/L   Chloride 106 101 - 111 mmol/L   CO2 26 22 - 32 mmol/L   Glucose, Bld 92 65 - 99 mg/dL   BUN 7 6 - 20 mg/dL   Creatinine, Ser 1.61 0.44 - 1.00 mg/dL   Calcium 9.2 8.9 - 09.6 mg/dL   Total Protein 7.5 6.5 - 8.1 g/dL   Albumin 3.9 3.5 - 5.0 g/dL   AST 67 (H) 15 - 41 U/L   ALT 68 (H) 14 - 54 U/L   Alkaline Phosphatase 105 38 - 126 U/L   Total Bilirubin 0.7 0.3 - 1.2 mg/dL   GFR calc non Af Amer >60 >60 mL/min   GFR calc Af Amer >60 >60 mL/min   Anion gap 10 5 - 15  CBC  Result Value Ref Range   WBC 4.6 4.0 - 10.5 K/uL   RBC 4.69 3.87 - 5.11 MIL/uL   Hemoglobin 11.9 (L) 12.0 - 15.0 g/dL   HCT 04.5 40.9 - 81.1 %   MCV 81.2 78.0 - 100.0 fL   MCH 25.4 (L) 26.0 - 34.0 pg   MCHC 31.2 30.0 - 36.0 g/dL   RDW 91.4 (H) 78.2 - 95.6 %   Platelets 223 150 - 400 K/uL  Pregnancy, urine  Result Value Ref Range   Preg Test, Ur NEGATIVE NEGATIVE   Ct Abdomen Pelvis W Contrast  03/15/2015  CLINICAL DATA:  35 year old female with a 5 week history of epigastric and right upper quadrant pain. 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 EXAM: CT ABDOMEN AND PELVIS  WITH CONTRAST TECHNIQUE: Multidetector CT imaging of the abdomen and pelvis was performed using the standard protocol following bolus administration of intravenous contrast. CONTRAST:  25mL OMNIPAQUE IOHEXOL 300 MG/ML SOLN, OMNIPAQUE IOHEXOL 300 MG/ML SOLN COMPARISON:  Prior CT abdomen/ pelvis 02/17/2015 FINDINGS: Lower Chest: The lung bases are clear. Visualized cardiac structures are within normal limits for size. No pericardial effusion. Unremarkable visualized distal thoracic esophagus. Abdomen: Surgical changes of sleeve gastrectomy. No evidence of postoperative complication. The duodenum, spleen, adrenal glands and pancreas are all unremarkable. Normal hepatic contour and morphology. No discrete hepatic lesion. Gallbladder is unremarkable. No intra or extrahepatic biliary ductal dilatation. Unremarkable appearance of the bilateral kidneys. No focal solid lesion, hydronephrosis or nephrolithiasis. No evidence of obstruction or focal bowel wall thickening. Normal appendix in the right lower quadrant. The terminal ileum is unremarkable. No free fluid or suspicious adenopathy. Pelvis: IUD in the expected location. Otherwise, unremarkable uterus, at adnexa and bladder. No free fluid or suspicious adenopathy. Musculoskeletal: No acute fracture or aggressive appearing lytic or blastic osseous lesion. Vascular: No significant atherosclerotic vascular disease, aneurysmal dilatation or acute abnormality. Portal veins are patent. IMPRESSION: 1. No acute abnormality in the abdomen or pelvis. 2. Surgical changes of prior sleeve gastrectomy without evidence of complication. 3. IUD in place. Electronically Signed   By: Malachy Moan M.D.   On: 03/15/2015 23:28    12 midnight p.m. pain and nausea are improved after treatment with intravenous opioids and antiemetics and intravenous fluids however she is requesting more antiemetics and pain medicine. Additional intravenous hydromorphone and IV Phenergan  ordered.  12:32 AM pain and nausea are well controlled. Benadryl was ordered for itching. Results for orders placed or performed during the hospital encounter of 03/15/15  Urinalysis, Routine w reflex microscopic (not at [facility])  Result Value  Ref Range   Color, Urine ORANGE (A) YELLOW   APPearance CLOUDY (A) CLEAR   Specific Gravity, Urine 1.045 (H) 1.005 - 1.030   pH 6.5 5.0 - 8.0   Glucose, UA NEGATIVE NEGATIVE mg/dL   Hgb urine dipstick NEGATIVE NEGATIVE   Bilirubin Urine MODERATE (A) NEGATIVE   Ketones, ur 40 (A) NEGATIVE mg/dL   Protein, ur 161 (A) NEGATIVE mg/dL   Nitrite NEGATIVE NEGATIVE   Leukocytes, UA SMALL (A) NEGATIVE  Urine microscopic-add on  Result Value Ref Range   Squamous Epithelial / LPF 6-30 (A) NONE SEEN   WBC, UA 6-30 0 - 5 WBC/hpf   RBC / HPF 0-5 0 - 5 RBC/hpf   Bacteria, UA MANY (A) NONE SEEN   Urine-Other MUCOUS PRESENT   Lipase, blood  Result Value Ref Range   Lipase 93 (H) 11 - 51 U/L  Comprehensive metabolic panel  Result Value Ref Range   Sodium 142 135 - 145 mmol/L   Potassium 3.2 (L) 3.5 - 5.1 mmol/L   Chloride 106 101 - 111 mmol/L   CO2 26 22 - 32 mmol/L   Glucose, Bld 92 65 - 99 mg/dL   BUN 7 6 - 20 mg/dL   Creatinine, Ser 0.96 0.44 - 1.00 mg/dL   Calcium 9.2 8.9 - 04.5 mg/dL   Total Protein 7.5 6.5 - 8.1 g/dL   Albumin 3.9 3.5 - 5.0 g/dL   AST 67 (H) 15 - 41 U/L   ALT 68 (H) 14 - 54 U/L   Alkaline Phosphatase 105 38 - 126 U/L   Total Bilirubin 0.7 0.3 - 1.2 mg/dL   GFR calc non Af Amer >60 >60 mL/min   GFR calc Af Amer >60 >60 mL/min   Anion gap 10 5 - 15  CBC  Result Value Ref Range   WBC 4.6 4.0 - 10.5 K/uL   RBC 4.69 3.87 - 5.11 MIL/uL   Hemoglobin 11.9 (L) 12.0 - 15.0 g/dL   HCT 40.9 81.1 - 91.4 %   MCV 81.2 78.0 - 100.0 fL   MCH 25.4 (L) 26.0 - 34.0 pg   MCHC 31.2 30.0 - 36.0 g/dL   RDW 78.2 (H) 95.6 - 21.3 %   Platelets 223 150 - 400 K/uL  Pregnancy, urine  Result Value Ref Range   Preg Test, Ur NEGATIVE NEGATIVE   Ct  Abdomen Pelvis W Contrast  03/15/2015  CLINICAL DATA:  35 year old female with a 5 week history of epigastric and right upper quadrant pain. 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 EXAM: CT ABDOMEN AND PELVIS WITH CONTRAST TECHNIQUE: Multidetector CT imaging of the abdomen and pelvis was performed using the standard protocol following bolus administration of intravenous contrast. CONTRAST:  25mL OMNIPAQUE IOHEXOL 300 MG/ML SOLN, OMNIPAQUE IOHEXOL 300 MG/ML SOLN COMPARISON:  Prior CT abdomen/ pelvis 02/17/2015 FINDINGS: Lower Chest: The lung bases are clear. Visualized cardiac structures are within normal limits for size. No pericardial effusion. Unremarkable visualized distal thoracic esophagus. Abdomen: Surgical changes of sleeve gastrectomy. No evidence of postoperative complication. The duodenum, spleen, adrenal glands and pancreas are all unremarkable. Normal hepatic contour and morphology. No discrete hepatic lesion. Gallbladder is unremarkable. No intra or extrahepatic biliary ductal dilatation. Unremarkable appearance of the bilateral kidneys. No focal solid lesion, hydronephrosis or nephrolithiasis. No evidence of obstruction or focal bowel wall thickening. Normal appendix in the right lower quadrant. The terminal ileum is unremarkable. No free fluid or suspicious adenopathy.  Pelvis: IUD in the expected location. Otherwise, unremarkable uterus, at adnexa and bladder. No free fluid or suspicious adenopathy. Musculoskeletal: No acute fracture or aggressive appearing lytic or blastic osseous lesion. Vascular: No significant atherosclerotic vascular disease, aneurysmal dilatation or acute abnormality. Portal veins are patent. IMPRESSION: 1. No acute abnormality in the abdomen or pelvis. 2. Surgical changes of prior sleeve gastrectomy without evidence of complication. 3. IUD in place. Electronically Signed   By: Malachy Moan M.D.   On: 03/15/2015 23:28    MDM  Pt's gastroenterologist is Dr. Gerilyn Pilgrim with  [facility]. She had contacted the office prior to coming here tonight and was advised to come here for possible admission to [facility] and GI consultation. She also reports that she had a positive HIDA scan at [facility] 03/04/15 and was scheduled to have elective cholecystectomy Final diagnoses:  None   I consulted with Dr. Julian Reil who will accept patient and see patient on the floor. She will be admitted to medical surgical floor at [facility] Diagnosis #1 acute pancreatitis #2 hypokalemia    I personally performed the services described in this documentation, which was scribed in my presence. The recorded information has been reviewed and considered.   Doug Sou, MD 03/16/15 [id number]

## 2015-03-15 NOTE — ED Notes (Signed)
Patient transported to CT 

## 2015-03-15 NOTE — Telephone Encounter (Signed)
I spoke with the PA and she states the pt had 1 bowel movement in 10 days and has a history of pancreatitis.  She has been seen in the [facility] regional ED several times. Per the pt she passed a "piece of tissue" last night.  I spoke with Dr Christella Hartigan and he recommends the pt be seen in the [facility] or [facility] ED for evaluation.  I passed this information on to the PA and she states she will send  the pt now.

## 2015-03-15 NOTE — ED Notes (Signed)
She had gastric bypass surgery in December. A week later she had a CT for abdominal pain and a blood clot was found on her liver. She was started on a blood thinner to treat the clot. She has been seen by a hematology specialist. She has been admitted with dehydration, ilius and pancreatitis x 2 since the surgery. She has been seeing her MDs on a daily basis. She is scheduled for gallbladder removal on Thursday. She is here today for pain.

## 2015-03-15 NOTE — ED Notes (Signed)
Pt had gastric bypass sx in December and since then has had multiple complications as a result.  She is here today on referral from PCP and GI doctor as they have tried to manage her symptoms as an outpatient and it has not been successful.  Pt states she has not had a bowel movement in 10 days, she is tender in upper abdominal area, RUQ and bilateral lower abdomen.  She has n/v despite home zofran, and has not been able to hold down her protonix.  She had something that is long and stringy come out of her rectum today that is worrisome to her.

## 2015-03-16 ENCOUNTER — Encounter (HOSPITAL_COMMUNITY): Payer: Self-pay | Admitting: General Practice

## 2015-03-16 DIAGNOSIS — Z7901 Long term (current) use of anticoagulants: Secondary | ICD-10-CM | POA: Diagnosis not present

## 2015-03-16 DIAGNOSIS — Z9884 Bariatric surgery status: Secondary | ICD-10-CM

## 2015-03-16 DIAGNOSIS — K581 Irritable bowel syndrome with constipation: Secondary | ICD-10-CM | POA: Diagnosis not present

## 2015-03-16 DIAGNOSIS — R1011 Right upper quadrant pain: Secondary | ICD-10-CM | POA: Diagnosis present

## 2015-03-16 DIAGNOSIS — F419 Anxiety disorder, unspecified: Secondary | ICD-10-CM | POA: Diagnosis present

## 2015-03-16 DIAGNOSIS — R7989 Other specified abnormal findings of blood chemistry: Secondary | ICD-10-CM | POA: Diagnosis not present

## 2015-03-16 DIAGNOSIS — D649 Anemia, unspecified: Secondary | ICD-10-CM

## 2015-03-16 DIAGNOSIS — M79604 Pain in right leg: Secondary | ICD-10-CM | POA: Diagnosis not present

## 2015-03-16 DIAGNOSIS — R945 Abnormal results of liver function studies: Secondary | ICD-10-CM

## 2015-03-16 DIAGNOSIS — I81 Portal vein thrombosis: Secondary | ICD-10-CM | POA: Diagnosis present

## 2015-03-16 DIAGNOSIS — Z8719 Personal history of other diseases of the digestive system: Secondary | ICD-10-CM

## 2015-03-16 DIAGNOSIS — K859 Acute pancreatitis without necrosis or infection, unspecified: Secondary | ICD-10-CM | POA: Diagnosis present

## 2015-03-16 DIAGNOSIS — K828 Other specified diseases of gallbladder: Secondary | ICD-10-CM | POA: Diagnosis present

## 2015-03-16 DIAGNOSIS — E876 Hypokalemia: Secondary | ICD-10-CM

## 2015-03-16 DIAGNOSIS — G4733 Obstructive sleep apnea (adult) (pediatric): Secondary | ICD-10-CM | POA: Insufficient documentation

## 2015-03-16 DIAGNOSIS — K5901 Slow transit constipation: Secondary | ICD-10-CM | POA: Diagnosis not present

## 2015-03-16 DIAGNOSIS — R1013 Epigastric pain: Secondary | ICD-10-CM | POA: Insufficient documentation

## 2015-03-16 DIAGNOSIS — Z975 Presence of (intrauterine) contraceptive device: Secondary | ICD-10-CM | POA: Diagnosis not present

## 2015-03-16 DIAGNOSIS — N3010 Interstitial cystitis (chronic) without hematuria: Secondary | ICD-10-CM | POA: Diagnosis present

## 2015-03-16 DIAGNOSIS — K5900 Constipation, unspecified: Secondary | ICD-10-CM | POA: Diagnosis present

## 2015-03-16 HISTORY — DX: Acute pancreatitis without necrosis or infection, unspecified: K85.90

## 2015-03-16 HISTORY — DX: Obstructive sleep apnea (adult) (pediatric): G47.33

## 2015-03-16 HISTORY — DX: Personal history of other diseases of the digestive system: Z87.19

## 2015-03-16 LAB — CBC
HCT: 32.9 % — ABNORMAL LOW (ref 36.0–46.0)
Hemoglobin: 10.5 g/dL — ABNORMAL LOW (ref 12.0–15.0)
MCH: 26.4 pg (ref 26.0–34.0)
MCHC: 31.9 g/dL (ref 30.0–36.0)
MCV: 82.7 fL (ref 78.0–100.0)
Platelets: 180 10*3/uL (ref 150–400)
RBC: 3.98 MIL/uL (ref 3.87–5.11)
RDW: 16.3 % — ABNORMAL HIGH (ref 11.5–15.5)
WBC: 3.8 10*3/uL — ABNORMAL LOW (ref 4.0–10.5)

## 2015-03-16 LAB — CREATININE, SERUM
Creatinine, Ser: 0.71 mg/dL (ref 0.44–1.00)
GFR calc Af Amer: >60 mL/min (ref >60)
GFR calc non Af Amer: >60 mL/min (ref >60)

## 2015-03-16 LAB — AMYLASE: Amylase: 64 U/L (ref 28–100)

## 2015-03-16 LAB — TRIGLYCERIDES: Triglycerides: 86 mg/dL (ref <150)

## 2015-03-16 MED ORDER — POLYETHYLENE GLYCOL 3350 17 G PO PACK
17.0000 g | PACK | Freq: Two times a day (BID) | ORAL | Status: DC
  Administered 2015-03-16 – 2015-03-19 (×4): 17 g via ORAL
  Filled 2015-03-16 (×4): qty 1

## 2015-03-16 MED ORDER — PROMETHAZINE HCL 25 MG/ML IJ SOLN
12.5000 mg | Freq: Once | INTRAMUSCULAR | Status: AC
  Administered 2015-03-16: 12.5 mg via INTRAVENOUS
  Filled 2015-03-16: qty 1

## 2015-03-16 MED ORDER — PROMETHAZINE HCL 25 MG PO TABS
12.5000 mg | ORAL_TABLET | Freq: Four times a day (QID) | ORAL | Status: DC | PRN
  Administered 2015-03-16: 12.5 mg via ORAL
  Filled 2015-03-16: qty 1

## 2015-03-16 MED ORDER — HYDROMORPHONE HCL 1 MG/ML IJ SOLN
1.0000 mg | Freq: Once | INTRAMUSCULAR | Status: AC
  Administered 2015-03-16: 1 mg via INTRAVENOUS
  Filled 2015-03-16: qty 1

## 2015-03-16 MED ORDER — DIPHENHYDRAMINE HCL 50 MG/ML IJ SOLN
25.0000 mg | Freq: Once | INTRAMUSCULAR | Status: AC
  Administered 2015-03-16: 25 mg via INTRAVENOUS
  Filled 2015-03-16: qty 1

## 2015-03-16 MED ORDER — HEPARIN SODIUM (PORCINE) 5000 UNIT/ML IJ SOLN
5000.0000 [IU] | Freq: Three times a day (TID) | INTRAMUSCULAR | Status: DC
  Administered 2015-03-16 – 2015-03-17 (×3): 5000 [IU] via SUBCUTANEOUS
  Filled 2015-03-16 (×3): qty 1

## 2015-03-16 MED ORDER — HYDROMORPHONE HCL 1 MG/ML IJ SOLN
1.0000 mg | INTRAMUSCULAR | Status: DC | PRN
  Administered 2015-03-16 – 2015-03-19 (×16): 1 mg via INTRAVENOUS
  Filled 2015-03-16 (×16): qty 1

## 2015-03-16 MED ORDER — PANTOPRAZOLE SODIUM 40 MG IV SOLR
40.0000 mg | INTRAVENOUS | Status: DC
  Administered 2015-03-16 – 2015-03-19 (×4): 40 mg via INTRAVENOUS
  Filled 2015-03-16 (×4): qty 40

## 2015-03-16 MED ORDER — SODIUM CHLORIDE 0.9 % IV SOLN
INTRAVENOUS | Status: AC
  Administered 2015-03-16: 04:00:00 via INTRAVENOUS

## 2015-03-16 MED ORDER — PROMETHAZINE HCL 25 MG/ML IJ SOLN
12.5000 mg | Freq: Four times a day (QID) | INTRAMUSCULAR | Status: DC | PRN
  Administered 2015-03-16 – 2015-03-19 (×11): 12.5 mg via INTRAVENOUS
  Filled 2015-03-16 (×11): qty 1

## 2015-03-16 MED ORDER — POTASSIUM CHLORIDE IN NACL 20-0.9 MEQ/L-% IV SOLN
INTRAVENOUS | Status: DC
  Administered 2015-03-16 – 2015-03-19 (×8): via INTRAVENOUS
  Filled 2015-03-16 (×9): qty 1000

## 2015-03-16 MED ORDER — BISACODYL 5 MG PO TBEC
5.0000 mg | DELAYED_RELEASE_TABLET | Freq: Once | ORAL | Status: AC
  Administered 2015-03-16: 5 mg via ORAL
  Filled 2015-03-16: qty 1

## 2015-03-16 MED ORDER — POTASSIUM CHLORIDE 10 MEQ/100ML IV SOLN
10.0000 meq | Freq: Once | INTRAVENOUS | Status: AC
  Administered 2015-03-16: 10 meq via INTRAVENOUS
  Filled 2015-03-16: qty 100

## 2015-03-16 MED ORDER — CITALOPRAM HYDROBROMIDE 20 MG PO TABS
10.0000 mg | ORAL_TABLET | Freq: Every day | ORAL | Status: DC
  Administered 2015-03-16 – 2015-03-19 (×4): 10 mg via ORAL
  Filled 2015-03-16 (×4): qty 1

## 2015-03-16 NOTE — ED Notes (Signed)
Pt placed on cardiac monitor 

## 2015-03-16 NOTE — Progress Notes (Signed)
Pt has no admission orders, MD text paged and notified

## 2015-03-16 NOTE — Consult Note (Signed)
Referring Provider: Belia Heman, PA-C Primary Care Physician:  Neldon Labella, MD Primary Gastroenterologist:  Dr. Christella Hartigan  Reason for Consultation:  Pancreatitis  HPI: Laura Shah is a 35 y.o. female with suspected episodes of pancreatitis (max lipase 577 at [facility] during recent hospitalization and max 93 here over the past year).  Extensive imaging both here and at [facility] (can be found in Care Everywhere) has not shown any evidence of pancreatitis radio-graphically.  This includes multiple CT scans, ultrasounds, HIDA scans, UGI study, etc.  Was hospitalized at [facility] for pancreatitis and ileus in January.  Underwent gastric sleeve in 12/2014.  Regardless, there is unknown source of "pnacreatitis".  No ETOH use, triglycerides normal, gallbladder evaluation has been unremarkable but apparently she was scheduled for cholecystectomy tomorrow by Dr. Buzzy Han in [facility] (this is the same physician who also recently performed her gastric sleeve).  No meds that she takes should be causing this.  She is questioning about having cholecystectomy done here.  Surgeons have not been called so we will discuss with them.  She says that she has not moved her bowels since 2/3.  No sign of ileus on recent imaging.  Also had a portal vein thrombosis recently and was on coumadin but is now on Lovenox at home, heparin here.  Recent imaging shows that this has resolved.  Presented here to [facility] hospital with recurrent upper abdominal pain.  Has experienced periodic vomiting.  Lipase was 93 on presentation.  CT scan with no evidence of pancreatitis or other abnormalities.  Says that during her recent HIDA earlier this month that she had extreme pain during the study.  No BM in 12 days but passing flatus.  AST/ALT with new mild elevated of 67/68.  Past Medical History  Diagnosis Date  . Interstitial cystitis   . Tachycardia   . Heart valve regurgitation   . Pancreatitis   . Anxiety   . Ileus  [facility])     Past Surgical History  Procedure Laterality Date  . Ovarian cyst removal    . Appendectomy    . Gastrectomy      Prior to Admission medications   Medication Sig Start Date End Date Taking? Authorizing Provider  ALPRAZolam Prudy Feeler) 0.25 MG tablet Take 0.25 mg by mouth at bedtime as needed for anxiety.   Yes Historical Provider, MD  citalopram (CELEXA) 10 MG tablet Take 10 mg by mouth daily.   Yes Historical Provider, MD  enoxaparin (LOVENOX) 80 MG/0.8ML injection Inject 80 mg into the skin every 12 (twelve) hours.   Yes Historical Provider, MD  Enoxaparin Sodium (LOVENOX Sharkey) Inject 80 mg into the skin 2 (two) times daily.    Yes Historical Provider, MD  HYDROcodone-acetaminophen (HYCET) 7.5-325 mg/15 ml solution Take 15 mLs by mouth every 6 (six) hours as needed. pain   Yes Historical Provider, MD  pantoprazole (PROTONIX) 40 MG tablet Take 1 tablet (40 mg total) by mouth daily. Patient taking differently: Take 40 mg by mouth 2 (two) times daily.  08/28/14  Yes Dione Booze, MD  promethazine (PHENERGAN) 25 MG suppository Place 25 mg rectally every 6 (six) hours as needed for nausea or vomiting.   Yes Historical Provider, MD  ondansetron (ZOFRAN) 4 MG tablet Take 1 tablet (4 mg total) by mouth every 6 (six) hours as needed for nausea or vomiting. 08/28/14   Dione Booze, MD    Current Facility-Administered Medications  Medication Dose Route Frequency Provider Last Rate Last Dose  .  0.9 %  sodium chloride infusion   Intravenous STAT Doug Sou, MD 150 mL/hr at 03/16/15 0343    . 0.9 % NaCl with KCl 20 mEq/ L  infusion   Intravenous Continuous Army Chaco, NP 125 mL/hr at 03/16/15 1004    . citalopram (CELEXA) tablet 10 mg  10 mg Oral Daily Army Chaco, NP   10 mg at 03/16/15 1006  . heparin injection 5,000 Units  5,000 Units Subcutaneous 3 times per day Army Chaco, NP      . HYDROmorphone (DILAUDID) injection 1 mg  1 mg Intravenous Q4H PRN Army Chaco,  NP      . pantoprazole (PROTONIX) injection 40 mg  40 mg Intravenous Q24H Army Chaco, NP   40 mg at 03/16/15 1006  . promethazine (PHENERGAN) tablet 12.5 mg  12.5 mg Oral Q6H PRN Army Chaco, NP        Allergies as of 03/15/2015 - Review Complete 03/15/2015  Allergen Reaction Noted  . Reglan [metoclopramide] Anxiety 03/15/2015  . Morphine and related Itching 08/28/2014    Family History  Problem Relation Age of Onset  . Breast cancer Maternal Grandmother   . Breast cancer Paternal Grandmother   . Diabetes Paternal Grandmother   . Diabetes Father   . Diabetes Father   . Hypertension Father     Social History   Social History  . Marital Status: Single    Spouse Name: N/A  . Number of Children: 2  . Years of Education: Grad    Occupational History  . Not on file.   Social History Main Topics  . Smoking status: Never Smoker   . Smokeless tobacco: Never Used  . Alcohol Use: No  . Drug Use: No  . Sexual Activity: Yes    Birth Control/ Protection: IUD   Other Topics Concern  . Not on file   Social History Narrative   Drinks about 1-2 cans of soda a day     Review of Systems: Ten point ROS is O/W negative except as mentioned in HPI.  Physical Exam: Vital signs in last 24 hours: Temp:  [97.8 F (36.6 C)-98.2 F (36.8 C)] 97.8 F (36.6 C) (02/15 0708) Pulse Rate:  [71-86] 71 (02/15 0708) Resp:  [14-18] 15 (02/15 0708) BP: (103-137)/(53-100) 112/53 mmHg (02/15 0708) SpO2:  [97 %-100 %] 100 % (02/15 0708) Weight:  [170 lb (77.111 kg)-178 lb 11.2 oz (81.058 kg)] 178 lb 11.2 oz (81.058 kg) (02/15 0314) Last BM Date: 03/04/15 General:  Alert, Well-developed, well-nourished, pleasant and cooperative in NAD Head:  Normocephalic and atraumatic. Eyes:  Sclera clear, no icterus.  Conjunctiva pink. Ears:  Normal auditory acuity. Mouth:  No deformity or lesions.   Lungs:  Clear throughout to auscultation.  No wheezes, crackles, or rhonchi.  Heart:  Regular  rate and rhythm; no murmurs, clicks, rubs, or gallops. Abdomen:  Soft, non-distended.  BS present, but quiet.  Diffuse TTP, but abdomen is benign.  Scars from recent gastric sleeve healing well.  Rectal:  Deferred.  Msk:  Symmetrical without gross deformities. Pulses:  Normal pulses noted. Extremities:  Without clubbing or edema. Neurologic:  Alert and oriented x 4;  grossly normal neurologically. Skin:  Intact without significant lesions or rashes. Psych:  Alert and cooperative. Normal mood and affect.  Lab Results:  Recent Labs  03/15/15 2055  WBC 4.6  HGB 11.9*  HCT 38.1  PLT 223   BMET  Recent Labs  03/15/15 2055  NA 142  K 3.2*  CL 106  CO2 26  GLUCOSE 92  BUN 7  CREATININE 0.74  CALCIUM 9.2   LFT  Recent Labs  03/15/15 2055  PROT 7.5  ALBUMIN 3.9  AST 67*  ALT 68*  ALKPHOS 105  BILITOT 0.7   Studies/Results: Ct Abdomen Pelvis W Contrast  03/15/2015  CLINICAL DATA:  35 year old female with a 5 week history of epigastric and right upper quadrant pain. 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 EXAM: CT ABDOMEN AND PELVIS WITH CONTRAST TECHNIQUE: Multidetector CT imaging of the abdomen and pelvis was performed using the standard protocol following bolus administration of intravenous contrast. CONTRAST:  25mL OMNIPAQUE IOHEXOL 300 MG/ML SOLN, OMNIPAQUE IOHEXOL 300 MG/ML SOLN COMPARISON:  Prior CT abdomen/ pelvis 02/17/2015 FINDINGS: Lower Chest: The lung bases are clear. Visualized cardiac structures are within normal limits for size. No pericardial effusion. Unremarkable visualized distal thoracic esophagus. Abdomen: Surgical changes of sleeve gastrectomy. No evidence of postoperative complication. The duodenum, spleen, adrenal glands and pancreas are all unremarkable. Normal hepatic contour and morphology. No discrete hepatic lesion. Gallbladder is unremarkable. No intra or  extrahepatic biliary ductal dilatation. Unremarkable appearance of the bilateral kidneys. No focal solid lesion, hydronephrosis or nephrolithiasis. No evidence of obstruction or focal bowel wall thickening. Normal appendix in the right lower quadrant. The terminal ileum is unremarkable. No free fluid or suspicious adenopathy. Pelvis: IUD in the expected location. Otherwise, unremarkable uterus, at adnexa and bladder. No free fluid or suspicious adenopathy. Musculoskeletal: No acute fracture or aggressive appearing lytic or blastic osseous lesion. Vascular: No significant atherosclerotic vascular disease, aneurysmal dilatation or acute abnormality. Portal veins are patent. IMPRESSION: 1. No acute abnormality in the abdomen or pelvis. 2. Surgical changes of prior sleeve gastrectomy without evidence of complication. 3. IUD in place. Electronically Signed   By: Malachy Moan M.D.   On: 03/15/2015 23:28   IMPRESSION:  -35 year old female with episodes of pancreatitis (max lipase 577 at [facility] and max 93 here over the past year).  Extensive imaging has not shown any evidence of pancreatitis radio-graphically.  Regardless, there is unknown source.  No ETOH use, triglycerides normal, gallbladder evaluation has been unremarkable but apparently she was scheduled for cholecystectomy tomorrow.  No meds that she takes should be causing this.  She is questioning about having it done here.  Surgeons have not been called so we will discuss with them.  -Recent ileus:  No sign of this on recent imaging.  No BM in 12 days so likely just constipation.    -Recent gastric sleeve in 12/2014 -PTV, on Lovenox at home, heparin here  PLAN: -Symptomatic management with IVF's, pain control, NPO status. -Will get surgery's input but they may recommend to proceed with her surgeon once she is discharged. -Will likely need bowel regimen to help her move her bowels. -Will discuss with Dr. Myrtie Neither.  Sayed Apostol D.  03/16/2015, 10:52  AM  Pager number [phone]

## 2015-03-16 NOTE — Progress Notes (Signed)
Initial Nutrition Assessment  DOCUMENTATION CODES:   Obesity unspecified  INTERVENTION:   -RD will follow for diet advancement and supplement as appropriate  NUTRITION DIAGNOSIS:   Inadequate oral intake related to altered GI function as evidenced by NPO status.  GOAL:   Patient will meet greater than or equal to 90% of their needs  MONITOR:   Diet advancement, Labs, Weight trends, Skin, I & O's  REASON FOR ASSESSMENT:   Malnutrition Screening Tool    ASSESSMENT:   Laura Shah is a 35 y.o. female with hx of interstitial cystitis, OSA, appendectomy and episode of pancreatitis in 03/2014 when she was seen outpt by [facility]. Pt had gastric sleeve surgery done on 01/27/2015 by Dr. Kathryne Gin at [facility]. Per patient, since surgery she has been hospitalized twice for acute pancreatitis and ileus last discharged on 1/25. She reports HIDA scan done at [facility] on 2/3, but results currently unavailable to me. She is scheduled for a cholecystectomy with Dr. Kathryne Gin tomorrow. Pt states abdominal pain and nausea became so bad she call [facility] yesterday and was told to come to the ED for further evaluation.  Pt admitted with acute pancreatitis.   Per chart review, pt underwent gastric sleeve at [facility] on 01/27/16. Pt has had multiple complications since surgery, including a blood clot discovered on her liver one week s/p surgery and 2 hospitalizations related to dehydration and ileus. Per ED notes, pt had a positive HIDA scan on 03/04/15 and pt was scheduled to undergo cholecystectomy. GI consult pending.   Spoke with pt at bedside. She endorses poor po intake and weight loss since undergoing gastric sleeve. PTA, she reports very minimal intake, consuming mainly 20 oz of crystal light daily and a few bites of cream soup. She reveals that she has been unable to take her prescribed vitamins due to poor po tolerance and difficulty keeping pills down. She reports  that she had tried multiple protein shakes and liquids PTA, including G2 gatorade, Powerade, and multiple flavors of Premier Protein shakes (which she did consume initially after surgery but soon grew tired of the flavor or disliked other flavor options). Pt reports she is willing to try Unjury supplements once diet is advanced.  Pt reports a 211# pre-op weight and endorses a 41# wt loss in the past 2 months (which reveals a 18.7% wt loss over the past 2 months).   Nutrition-Focused physical exam completed. Findings are no fat depletion, no muscle depletion, and no edema. 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   Case discussed with RN. Pt is awaiting GI evaluation for further work-up.   Labs reviewed: K: 3.2 (on IV supplementation), Lipase: 93, AST/ALT: 67/68.   Diet Order:  Diet NPO time specified Except for: Sips with Meds  Skin:  Reviewed, no issues  Last BM:  03/04/15  Height:   Ht Readings from Last 1 Encounters:  03/16/15  (1.549 m)    Weight:   Wt Readings from Last 1 Encounters:  03/16/15 178 lb 11.2 oz (81.058 kg)    Ideal Body Weight:  47.7 kg  BMI:  Body mass index is 33.78 kg/(m^2).  Estimated Nutritional Needs:   Kcal:  1200-1400  Protein:  55-70 grams  Fluid:  1.2-1.4 L  EDUCATION NEEDS:   No education needs identified at this time  Mackinzie Vuncannon A.  Jimmye Norman, RD, LDN, CDE Pager: [phone] After hours Pager: [phone]

## 2015-03-16 NOTE — Progress Notes (Signed)
NP paged about Dr. Julian Reil coming to see patient after patient asked.

## 2015-03-16 NOTE — ED Notes (Signed)
Pt placed on auto vitals Q30.  

## 2015-03-16 NOTE — Progress Notes (Signed)
  Pt admitted to the unit. Pt is stable, alert and oriented per baseline. Oriented to room, staff, and call bell. Educated to call for any assistance. Bed in lowest position, call bell within reach- will continue to monitor. 

## 2015-03-16 NOTE — H&P (Signed)
[facility] History and Physical                                                                                    Laura Shah, is a 35 y.o. female  MRN: [id number]   DOB - 04-15-1980  Admit Date - 03/15/2015  Outpatient Primary MD Laura Hazel, MD   With History of -  Past Medical History  Diagnosis Date  . Interstitial cystitis   . Tachycardia   . Heart valve regurgitation   . Pancreatitis   . Anxiety   . Ileus [facility])       Past Surgical History  Procedure Laterality Date  . Ovarian cyst removal    . Appendectomy    . Gastrectomy      in for   Chief Complaint  Patient presents with  . Abdominal Pain     HPI  Laura Shah  is a 35 y.o. female with hx of interstitial cystitis, OSA, appendectomy and episode of pancreatitis in 03/2014 when she was seen outpt by [facility]. Pt had gastric sleeve surgery done on 01/27/2015 by Dr. Kathryne Shah at [facility]. Per patient, since surgery she has been hospitalized twice for acute pancreatitis and ileus last discharged on 1/25. She reports HIDA scan done at [facility] on 2/3, but results currently unavailable to me. She is scheduled for a cholecystectomy with Dr. Kathryne Shah tomorrow. Pt states abdominal pain and nausea became so bad she call [facility] yesterday and was told to come to the ED for further evaluation.    Pt states abdominal pain has not completely subsided since first hospitalization, but notably worse in the last 2 days with n/v and constipation. She states no BM in 10 days. Denies any chest pain, dizziness, sob, fever or chills. Work up done in ED shows lipase 93, mildly elevated LFTs, +bili in urine and mild hypokalemia. CT abdomen and pelvis unremarkable. Pt is to be admitted for further evaluation and treatment.     Review of Systems   In addition to the HPI above,  No Fever-chills, No Headache, No changes with Vision or hearing, No problems swallowing food or Liquids, No Chest pain, Cough or Shortness of  Breath, No Blood in stool or Urine, No dysuria, No new skin rashes or bruises, No new joints pains-aches,  No new weakness, tingling, numbness in any extremity, No recent weight gain or loss, A full 10 point Review of Systems was done, except as stated above, all other Review of Systems were negative.  Social History Engaged. 2 children ages 87 and 5. Works as Runner, broadcasting/film/video, but currently on disability. No tobacco, etoh or elicit drug use.    Family History Family History  Problem Relation Age of Onset  . Breast cancer Maternal Grandmother   . Breast cancer Paternal Grandmother   . Diabetes Paternal Grandmother   . Diabetes Father   . Diabetes Father   . Hypertension Father     Prior to Admission medications   Medication Sig Start Date End Date Taking? Authorizing Provider  Enoxaparin Sodium (LOVENOX Laura [facility]) Inject 80 mg into the skin 2 (two) times daily.    Yes Historical Provider, MD  citalopram (CELEXA) 10 MG tablet Take 10 mg by mouth daily.    Historical Provider, MD  ondansetron (ZOFRAN) 4 MG tablet Take 1 tablet (4 mg total) by mouth every 6 (six) hours as needed for nausea or vomiting. 08/28/14   Laura Booze, MD  pantoprazole (PROTONIX) 40 MG tablet Take 1 tablet (40 mg total) by mouth daily. 08/28/14   Laura Booze, MD    Allergies  Allergen Reactions  . Reglan [Metoclopramide] Anxiety  . Morphine And Related Itching    Physical Exam  Vitals  Blood pressure 112/53, pulse 71, temperature 97.8 F (36.6 C), temperature source Oral, resp. rate 15, height  (1.549 m), weight 81.058 kg (178 lb 11.2 oz), SpO2 100 %.   General:  lying in bed in NAD,   Psych:  Normal affect and insight, Not Suicidal or Homicidal, Awake Alert, Oriented X 3.  Neuro:   No F.N deficits, ALL C.Nerves Intact, Strength 5/5 all 4 extremities, Sensation intact all 4 extremities.  ENT:  Ears and Eyes appear Normal, Conjunctivae clear, PER. Mucous membranes dry   Neck:  Supple, No lymphadenopathy  appreciated  Respiratory:  Symmetrical chest wall movement, Good air movement bilaterally, CTAB.  Cardiac:  RRR, No Murmurs, no LE edema noted, no JVD.    Abdomen:  Positive bowel sounds, Soft, diffuse tenderness on palpation of right side of abdomen with guarding.   Skin:  No Cyanosis, Normal Skin Turgor, No Skin Rash or Bruise.  Extremities:  Able to move all 4. 5/5 strength in each,    Data Review  CBC  Recent Labs Lab 03/15/15 2055  WBC 4.6  HGB 11.9*  HCT 38.1  PLT 223  MCV 81.2  MCH 25.4*  MCHC 31.2  RDW 15.9*    Chemistries   Recent Labs Lab 03/15/15 2055  NA 142  K 3.2*  CL 106  CO2 26  GLUCOSE 92  BUN 7  CREATININE 0.74  CALCIUM 9.2  AST 67*  ALT 68*  ALKPHOS 105  BILITOT 0.7    estimated creatinine clearance is 93.8 mL/min (by C-G formula based on Cr of 0.74).   Urinalysis    Component Value Date/Time   COLORURINE ORANGE* 03/15/2015 1940   APPEARANCEUR CLOUDY* 03/15/2015 1940   LABSPEC 1.045* 03/15/2015 1940   PHURINE 6.5 03/15/2015 1940   GLUCOSEU NEGATIVE 03/15/2015 1940   HGBUR NEGATIVE 03/15/2015 1940   BILIRUBINUR MODERATE* 03/15/2015 1940   KETONESUR 40* 03/15/2015 1940   PROTEINUR 100* 03/15/2015 1940   UROBILINOGEN 1.0 03/16/2014 1910   NITRITE NEGATIVE 03/15/2015 1940   LEUKOCYTESUR SMALL* 03/15/2015 1940    Imaging results:   Ct Abdomen Pelvis W Contrast  03/15/2015  CLINICAL DATA:  35 year old female with a 5 week history of epigastric and right upper quadrant pain. 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 EXAM: CT ABDOMEN AND PELVIS WITH CONTRAST TECHNIQUE: Multidetector CT imaging of the abdomen and pelvis was performed using the standard protocol following bolus administration of intravenous contrast. CONTRAST:  25mL OMNIPAQUE IOHEXOL 300 MG/ML SOLN, OMNIPAQUE IOHEXOL 300 MG/ML SOLN COMPARISON:  Prior CT abdomen/ pelvis 02/17/2015  FINDINGS: Lower Chest: The lung bases are clear. Visualized cardiac structures are within normal limits for size. No pericardial effusion. Unremarkable visualized distal thoracic esophagus. Abdomen: Surgical changes of sleeve gastrectomy. No evidence of postoperative complication. The duodenum, spleen, adrenal glands and pancreas are all unremarkable. Normal hepatic contour and morphology. No discrete hepatic lesion. Gallbladder is  unremarkable. No intra or extrahepatic biliary ductal dilatation. Unremarkable appearance of the bilateral kidneys. No focal solid lesion, hydronephrosis or nephrolithiasis. No evidence of obstruction or focal bowel wall thickening. Normal appendix in the right lower quadrant. The terminal ileum is unremarkable. No free fluid or suspicious adenopathy. Pelvis: IUD in the expected location. Otherwise, unremarkable uterus, at adnexa and bladder. No free fluid or suspicious adenopathy. Musculoskeletal: No acute fracture or aggressive appearing lytic or blastic osseous lesion. Vascular: No significant atherosclerotic vascular disease, aneurysmal dilatation or acute abnormality. Portal veins are patent. IMPRESSION: 1. No acute abnormality in the abdomen or pelvis. 2. Surgical changes of prior sleeve gastrectomy without evidence of complication. 3. IUD in place. Electronically Signed   By: Malachy Moan M.D.   On: 03/15/2015 23:28    Assessment & Plan  Active Problems:   Acute pancreatitis   Anemia   Hypokalemia   S/P laparoscopic sleeve gastrectomy  1. Acute pancreatitis, unclear etiology  -Lipase 93, mildly elevated LFTs, CT abd/pelvis unremarkable -Will need to rule out choledocholithiasis. She did have a HIDA scan on 2/3 and will try to get these records -check triglycerides, amylase -IVFs, pain control -NPO for now until Shah eval  2. Hypokalemia, mild -replete with IVFs. -recheck in am  3. Anemia -mild and seemingly chronic based on chart review. -no ABL on  exam -monitor trend.   4. Recent gastric sleeve surgery  DVT Prophylaxis: SQ heparin   AM Labs Ordered, also please review Full Orders    Code Status:  Full code  Condition:  Stable   Time spent in minutes : 60   Cordelia Pen, NP on 03/16/2015 at 8:39 AM Between 7am to 7pm - Pager - [phone] After 7pm go to [email or web address] - password [email or web address]  And look for the night coverage person covering me after hours  [facility]

## 2015-03-16 NOTE — ED Notes (Signed)
Pt transferred to [place] via [facility] 

## 2015-03-16 NOTE — Progress Notes (Signed)
Admissions notified that patient has arrived

## 2015-03-16 NOTE — Progress Notes (Signed)
35 yo F with h/o pancreatitis and ileus, gastric sleeve surgery at end of Dec 2016.  Recurrent abd pain since gastric sleeve surgery, has h/o work up at [facility] after a second episode of symptoms.  Apparently had positive hida scan Feb 3rd (per report), scheduled to have semi-elective cholecystectomy 2 days from now at [facility].  [facility] asked patient to go to [facility] so [facility] gets [facility] need to be consulted.  LFTs mild elevation, but does have bilirubin in urine.  May need MRCP to look for CBD stone (probably needs GI consult to determine this)  Lipase mild elevation at 93.  Patient accepted to med surg.

## 2015-03-16 NOTE — Care Management Note (Signed)
Case Management Note  Patient Details  Name: Laura Shah MRN: [id number] Date of Birth: 07/23/1980  Subjective/Objective:                    Action/Plan:   Expected Discharge Date:  03/22/15               Expected Discharge Plan:  Home/Self Care  In-House Referral:     Discharge planning Services     Post Acute Care Choice:    Choice offered to:     DME Arranged:    DME Agency:     HH Arranged:    HH Agency:     Status of Service:  In process, will continue to follow  Medicare Important Message Given:    Date Medicare IM Given:    Medicare IM give by:    Date Additional Medicare IM Given:    Additional Medicare Important Message give by:     If discussed at Long Length of Stay Meetings, dates discussed:    Additional Comments: Initial UR completed  Kingsley Plan, RN 03/16/2015, 10:19 AM

## 2015-03-16 NOTE — Consult Note (Signed)
Reason for Consult: recurrent pancreatitis  Referring Physician: Dr. Wilfrid Lund    HPI: Laura Shah is a 35 year old female who had a laparoscopic assisted sleeve gastrectomy on 01/27/15 by Dr. Raul Del.  She has since been hospitalized twice with bouts of pancreatitis.  She was also found to have a portal vein thrombosis and started on Coumadin at the beginning of January.  The patient does not drink alcohol.  Triglycerides 1/20 were 61.  She has had 2 abdominal US which were negative for stones.  Followed by a HIDA scan that was normal, however, the patient states that she had pain with CCK administration.  For this reason, Dr. Raul Del has offered her a cholecystectomy.  This is scheduled for tomorrow.  She presented today with abdominal pain and nausea.  Symptoms are constant, worsen with oral intake.  No alleviating factors.  She denies vomiting.  She reports 40lb weight loss since her surgery in December.  States her last bowel movement was on 2/3.  She is passing flatus.  Initial work up last night, shows K 3.2.  Mildly elevated lipase at 93.  AST/ALT 67/68.  No white count.  CT of A/P was normal.   Past Medical History  Diagnosis Date  . Interstitial cystitis   . Tachycardia   . Heart valve regurgitation   . Pancreatitis   . Anxiety   . Ileus [facility])     Past Surgical History  Procedure Laterality Date  . Ovarian cyst removal    . Appendectomy    . Gastrectomy      Family History  Problem Relation Age of Onset  . Breast cancer Maternal Grandmother   . Breast cancer Paternal Grandmother   . Diabetes Paternal Grandmother   . Diabetes Father   . Diabetes Father   . Hypertension Father     Social History:  reports that she has never smoked. She has never used smokeless tobacco. She reports that she does not drink alcohol or use illicit drugs.  Allergies:  Allergies  Allergen Reactions  . Reglan [Metoclopramide] Anxiety  . Morphine And Related Itching    Medications:   Scheduled Meds: . sodium chloride   Intravenous STAT  . citalopram  10 mg Oral Daily  . heparin  5,000 Units Subcutaneous 3 times per day  . pantoprazole (PROTONIX) IV  40 mg Intravenous Q24H   Continuous Infusions: . 0.9 % NaCl with KCl 20 mEq / L 125 mL/hr at 03/16/15 1004   PRN Meds:.HYDROmorphone (DILAUDID) injection, promethazine   Results for orders placed or performed during the hospital encounter of 03/15/15 (from the past 48 hour(s))  Urinalysis, Routine w reflex microscopic (not at [facility])     Status: Abnormal   Collection Time: 03/15/15  7:40 PM  Result Value Ref Range   Color, Urine ORANGE (A) YELLOW    Comment: BIOCHEMICALS MAY BE AFFECTED BY COLOR   APPearance CLOUDY (A) CLEAR   Specific Gravity, Urine 1.045 (H) 1.005 - 1.030   pH 6.5 5.0 - 8.0   Glucose, UA NEGATIVE NEGATIVE mg/dL   Hgb urine dipstick NEGATIVE NEGATIVE   Bilirubin Urine MODERATE (A) NEGATIVE   Ketones, ur 40 (A) NEGATIVE mg/dL   Protein, ur 100 (A) NEGATIVE mg/dL   Nitrite NEGATIVE NEGATIVE   Leukocytes, UA SMALL (A) NEGATIVE  Urine microscopic-add on     Status: Abnormal   Collection Time: 03/15/15  7:40 PM  Result Value Ref Range   Squamous Epithelial / LPF 6-30 (A) NONE  SEEN   WBC, UA 6-30 0 - 5 WBC/hpf   RBC / HPF 0-5 0 - 5 RBC/hpf   Bacteria, UA MANY (A) NONE SEEN   Urine-Other MUCOUS PRESENT   Pregnancy, urine     Status: None   Collection Time: 03/15/15  7:40 PM  Result Value Ref Range   Preg Test, Ur NEGATIVE NEGATIVE    Comment:        THE SENSITIVITY OF THIS METHODOLOGY IS >20 mIU/mL.   Lipase, blood     Status: Abnormal   Collection Time: 03/15/15  8:55 PM  Result Value Ref Range   Lipase 93 (H) 11 - 51 U/L  Comprehensive metabolic panel     Status: Abnormal   Collection Time: 03/15/15  8:55 PM  Result Value Ref Range   Sodium 142 135 - 145 mmol/L   Potassium 3.2 (L) 3.5 - 5.1 mmol/L   Chloride 106 101 - 111 mmol/L   CO2 26 22 - 32 mmol/L   Glucose, Bld 92 65 - 99 mg/dL    BUN 7 6 - 20 mg/dL   Creatinine, Ser 0.74 0.44 - 1.00 mg/dL   Calcium 9.2 8.9 - 10.3 mg/dL   Total Protein 7.5 6.5 - 8.1 g/dL   Albumin 3.9 3.5 - 5.0 g/dL   AST 67 (H) 15 - 41 U/L   ALT 68 (H) 14 - 54 U/L   Alkaline Phosphatase 105 38 - 126 U/L   Total Bilirubin 0.7 0.3 - 1.2 mg/dL   GFR calc non Af Amer >60 >60 mL/min   GFR calc Af Amer >60 >60 mL/min    Comment: (NOTE) The eGFR has been calculated using the CKD EPI equation. This calculation has not been validated in all clinical situations. eGFR's persistently <60 mL/min signify possible Chronic Kidney Disease.    Anion gap 10 5 - 15  CBC     Status: Abnormal   Collection Time: 03/15/15  8:55 PM  Result Value Ref Range   WBC 4.6 4.0 - 10.5 K/uL    Comment: WHITE COUNT CONFIRMED ON SMEAR   RBC 4.69 3.87 - 5.11 MIL/uL   Hemoglobin 11.9 (L) 12.0 - 15.0 g/dL   HCT 38.1 36.0 - 46.0 %   MCV 81.2 78.0 - 100.0 fL   MCH 25.4 (L) 26.0 - 34.0 pg   MCHC 31.2 30.0 - 36.0 g/dL   RDW 15.9 (H) 11.5 - 15.5 %   Platelets 223 150 - 400 K/uL    Ct Abdomen Pelvis W Contrast  03/15/2015  CLINICAL DATA:  35 year old female with a 5 week history of epigastric and right upper quadrant pain. Sleeve gastrectomy performed in late December 5427 complicated by postoperative pancreatitis and portal vein thrombosis. EXAM: CT ABDOMEN AND PELVIS WITH CONTRAST TECHNIQUE: Multidetector CT imaging of the abdomen and pelvis was performed using the standard protocol following bolus administration of intravenous contrast. CONTRAST:  22m OMNIPAQUE IOHEXOL 300 MG/ML SOLN, 1094mOMNIPAQUE IOHEXOL 300 MG/ML SOLN COMPARISON:  Prior CT abdomen/ pelvis 02/17/2015 FINDINGS: Lower Chest: The lung bases are clear. Visualized cardiac structures are within normal limits for size. No pericardial effusion. Unremarkable visualized distal thoracic esophagus. Abdomen: Surgical changes of sleeve gastrectomy. No evidence of postoperative complication. The duodenum, spleen, adrenal  glands and pancreas are all unremarkable. Normal hepatic contour and morphology. No discrete hepatic lesion. Gallbladder is unremarkable. No intra or extrahepatic biliary ductal dilatation. Unremarkable appearance of the bilateral kidneys. No focal solid lesion, hydronephrosis or nephrolithiasis. No evidence of obstruction  or focal bowel wall thickening. Normal appendix in the right lower quadrant. The terminal ileum is unremarkable. No free fluid or suspicious adenopathy. Pelvis: IUD in the expected location. Otherwise, unremarkable uterus, at adnexa and bladder. No free fluid or suspicious adenopathy. Musculoskeletal: No acute fracture or aggressive appearing lytic or blastic osseous lesion. Vascular: No significant atherosclerotic vascular disease, aneurysmal dilatation or acute abnormality. Portal veins are patent. IMPRESSION: 1. No acute abnormality in the abdomen or pelvis. 2. Surgical changes of prior sleeve gastrectomy without evidence of complication. 3. IUD in place. Electronically Signed   By: Jacqulynn Cadet M.D.   On: 03/15/2015 23:28    Review of Systems  Constitutional: Positive for weight loss and malaise/fatigue. Negative for fever, chills and diaphoresis.  Respiratory: Positive for shortness of breath. Negative for cough, hemoptysis, sputum production and wheezing.   Cardiovascular: Negative for chest pain, palpitations, orthopnea, claudication and leg swelling.  Gastrointestinal: Positive for nausea, abdominal pain and constipation. Negative for heartburn, vomiting, diarrhea, blood in stool and melena.  Genitourinary: Negative for dysuria, urgency, frequency, hematuria and flank pain.  Neurological: Positive for weakness. Negative for dizziness, tingling, tremors, sensory change, speech change, focal weakness, seizures, loss of consciousness and headaches.   Blood pressure 112/53, pulse 71, temperature 97.8 F (36.6 C), temperature source Oral, resp. rate 15, height 5' 1"  (1.549 m),  weight 81.058 kg (178 lb 11.2 oz), SpO2 100 %. Physical Exam  Constitutional: She is oriented to person, place, and time. She appears well-developed and well-nourished. No distress.  Cardiovascular: Normal rate, regular rhythm and normal heart sounds.  Exam reveals no gallop and no friction rub.   No murmur heard. Respiratory: Effort normal and breath sounds normal. No respiratory distress. She has no wheezes. She has no rales. She exhibits no tenderness.  GI: Soft. Bowel sounds are normal. She exhibits no distension and no mass. There is no guarding.  TTP upper abdomen  Musculoskeletal: Normal range of motion. She exhibits no tenderness.  Neurological: She is alert and oriented to person, place, and time.  Skin: Skin is warm and dry. No rash noted. She is not diaphoretic. No erythema. No pallor.  Psychiatric: She has a normal mood and affect. Her behavior is normal. Judgment and thought content normal.     Diagnostic tests from [facility]: HIDA scan 03/04/15---normal study, normal ejection fraction.  The patient reports pain and nausea with CCK administration  Abdominal US 03/04/15 no evidence of sludge, stones.  Negative study Abdominal US 02/18/15 negative for stones or cholecystitis  CT A/P 02/06/15 right portal vein thrombus  Assessment/Plan: Hx pancreatitis  Abdominal pain/nausea ? Biliary dyskinesia--reported pain with CCK administration.  No stones and or sludge on [place] x2.  She has been off coumadin and on lovenox. There is not a INR this admission.  She is scheduled for a [place]. Etiology of pancreatitis is questionable. Once again, she does not have stones/sludge.  No alcohol or hypertriglyceridemia.  Not quite sure whether she has acute pancreatitis now.  There are no urgent surgical indications.  Dr. Brantley Stage to make final recommendation. Portal vein thrombus-diagnosed 1/17.  lovenox bridge prior to admission. On prophylactic heparin.  She probably needs  heparin gtt or lovenox, will leave up to primary team.   Oneida Arenas, Rhenda [place] ANP-BC 03/16/2015, 2:05 PM

## 2015-03-17 DIAGNOSIS — K5901 Slow transit constipation: Secondary | ICD-10-CM | POA: Insufficient documentation

## 2015-03-17 DIAGNOSIS — I81 Portal vein thrombosis: Secondary | ICD-10-CM

## 2015-03-17 DIAGNOSIS — Z9884 Bariatric surgery status: Secondary | ICD-10-CM

## 2015-03-17 HISTORY — DX: Portal vein thrombosis: I81

## 2015-03-17 LAB — RAPID URINE DRUG SCREEN, HOSP PERFORMED
Amphetamines: NOT DETECTED
Barbiturates: NOT DETECTED
Benzodiazepines: NOT DETECTED
Cocaine: NOT DETECTED
Opiates: POSITIVE — AB
Tetrahydrocannabinol: NOT DETECTED

## 2015-03-17 LAB — URINALYSIS, ROUTINE W REFLEX MICROSCOPIC
Bilirubin Urine: NEGATIVE
Glucose, UA: NEGATIVE mg/dL
Ketones, ur: 40 mg/dL — AB
Nitrite: NEGATIVE
Protein, ur: NEGATIVE mg/dL
Specific Gravity, Urine: 1.01 (ref 1.005–1.030)
pH: 5.5 (ref 5.0–8.0)

## 2015-03-17 LAB — CBC
HCT: 31.7 % — ABNORMAL LOW (ref 36.0–46.0)
Hemoglobin: 9.7 g/dL — ABNORMAL LOW (ref 12.0–15.0)
MCH: 25.3 pg — ABNORMAL LOW (ref 26.0–34.0)
MCHC: 30.6 g/dL (ref 30.0–36.0)
MCV: 82.8 fL (ref 78.0–100.0)
Platelets: 185 10*3/uL (ref 150–400)
RBC: 3.83 MIL/uL — ABNORMAL LOW (ref 3.87–5.11)
RDW: 16.5 % — ABNORMAL HIGH (ref 11.5–15.5)
WBC: 3.3 10*3/uL — ABNORMAL LOW (ref 4.0–10.5)

## 2015-03-17 LAB — PROTIME-INR
INR: 1.25 (ref 0.00–1.49)
Prothrombin Time: 15.9 seconds — ABNORMAL HIGH (ref 11.6–15.2)

## 2015-03-17 LAB — COMPREHENSIVE METABOLIC PANEL
ALT: 46 U/L (ref 14–54)
AST: 33 U/L (ref 15–41)
Albumin: 2.7 g/dL — ABNORMAL LOW (ref 3.5–5.0)
Alkaline Phosphatase: 77 U/L (ref 38–126)
Anion gap: 10 (ref 5–15)
BUN: <5 mg/dL — ABNORMAL LOW (ref 6–20)
CO2: 19 mmol/L — ABNORMAL LOW (ref 22–32)
Calcium: 8.7 mg/dL — ABNORMAL LOW (ref 8.9–10.3)
Chloride: 113 mmol/L — ABNORMAL HIGH (ref 101–111)
Creatinine, Ser: 0.63 mg/dL (ref 0.44–1.00)
GFR calc Af Amer: >60 mL/min (ref >60)
GFR calc non Af Amer: >60 mL/min (ref >60)
Glucose, Bld: 74 mg/dL (ref 65–99)
Potassium: 3.9 mmol/L (ref 3.5–5.1)
Sodium: 142 mmol/L (ref 135–145)
Total Bilirubin: 0.7 mg/dL (ref 0.3–1.2)
Total Protein: 5.2 g/dL — ABNORMAL LOW (ref 6.5–8.1)

## 2015-03-17 LAB — GLUCOSE, CAPILLARY
Glucose-Capillary: 68 mg/dL (ref 65–99)
Glucose-Capillary: 72 mg/dL (ref 65–99)

## 2015-03-17 LAB — URINE MICROSCOPIC-ADD ON

## 2015-03-17 LAB — HEPARIN LEVEL (UNFRACTIONATED)
Heparin Unfractionated: 0.88 IU/mL — ABNORMAL HIGH (ref 0.30–0.70)
Heparin Unfractionated: 0.73 IU/mL — ABNORMAL HIGH (ref 0.30–0.70)

## 2015-03-17 LAB — LIPASE, BLOOD: Lipase: 53 U/L — ABNORMAL HIGH (ref 11–51)

## 2015-03-17 MED ORDER — BISACODYL 10 MG RE SUPP
10.0000 mg | Freq: Once | RECTAL | Status: AC
  Administered 2015-03-17: 10 mg via RECTAL
  Filled 2015-03-17: qty 1

## 2015-03-17 MED ORDER — HEPARIN (PORCINE) IN NACL 100-0.45 UNIT/ML-% IJ SOLN
950.0000 [IU]/h | INTRAMUSCULAR | Status: AC
  Administered 2015-03-17: 1150 [IU]/h via INTRAVENOUS
  Filled 2015-03-17 (×2): qty 250

## 2015-03-17 NOTE — Progress Notes (Signed)
Vascular ultra sound of lower extremities ordered d/t pain and swelling to right leg and thigh, scds not in use at this time.

## 2015-03-17 NOTE — Progress Notes (Signed)
PROGRESS NOTE  Laura Shah:[id number] DOB: [birth year] DOA: 03/15/2015 PCP: Neldon Labella, MD Brief History 34 year old female with a history of interstitial cystitis, and recent gastric sleeve surgery on 01/27/2015 at [facility]. Postoperatively, The patient had to be hospitalized twice for pancreatitis. Most recently, the patient was discharged from the hospital after workup for her gallbladder. She had a right upper quadrant ultrasound on 02/18/2015 and 03/04/15 which did not reveal any sludge or stones contrary to pt's claim of sludge. HIDA scan revealed patent ducts with EF of 74%. However, the patient claimed that she had pain with injection of CCK. The patient was also recently diagnosed with a portal vein thrombosis noted on a CT scan on 02/05/2015. She saw hematology, Dr. Foy Guadal[place]ara, at [facility] whom recommended anticoagulation for at least 3 months. The patient was started on Lovenox 1 mg/kg [place] bid and states she has been compliant up to the hospitalization. She presented to the ED with worsening abdominal pain with nausea and vomiting for 2 day period. Assessment/Plan: Abdominal pain/nausea and vomiting -pain appears to be out of proportion with clinical exam -03/15/2015 CT abdomen and pelvis--no acute findings, unremarkable gallbladder -not convinced this is pancreatitis -?constipation--no BM x 2 weeks -appreciate surgical input -appreciate GI input -no vomiting since admission to [facility] -recent RUQ [place] and HIDA as above Elevated Lipase -Not convinced the patient has pancreatitis with minimal elevation  -clinical syndrome not consistent with pancreatitis -start clear liquids if OK with general surgery Portal vein thrombosis -start heparin IV for now -plan to d/c with lovenox as per her hematologist recommendation Pyuria -add urine culture -check UDS Anxiety -continue celexa when able to tolerate  po Transaminasemia -improved -check HIV   Family Communication:   Pt at beside Disposition Plan:   Home 2-3 days      Procedures/Studies: Ct Abdomen Pelvis W Contrast  03/15/2015  CLINICAL DATA:  35 year old female with a 5 week history of epigastric and right upper quadrant pain. 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 EXAM: CT ABDOMEN AND PELVIS WITH CONTRAST TECHNIQUE: Multidetector CT imaging of the abdomen and pelvis was performed using the standard protocol following bolus administration of intravenous contrast. CONTRAST:  25mL OMNIPAQUE IOHEXOL 300 MG/ML SOLN, OMNIPAQUE IOHEXOL 300 MG/ML SOLN COMPARISON:  Prior CT abdomen/ pelvis 02/17/2015 FINDINGS: Lower Chest: The lung bases are clear. Visualized cardiac structures are within normal limits for size. No pericardial effusion. Unremarkable visualized distal thoracic esophagus. Abdomen: Surgical changes of sleeve gastrectomy. No evidence of postoperative complication. The duodenum, spleen, adrenal glands and pancreas are all unremarkable. Normal hepatic contour and morphology. No discrete hepatic lesion. Gallbladder is unremarkable. No intra or extrahepatic biliary ductal dilatation. Unremarkable appearance of the bilateral kidneys. No focal solid lesion, hydronephrosis or nephrolithiasis. No evidence of obstruction or focal bowel wall thickening. Normal appendix in the right lower quadrant. The terminal ileum is unremarkable. No free fluid or suspicious adenopathy. Pelvis: IUD in the expected location. Otherwise, unremarkable uterus, at adnexa and bladder. No free fluid or suspicious adenopathy. Musculoskeletal: No acute fracture or aggressive appearing lytic or blastic osseous lesion. Vascular: No significant atherosclerotic vascular disease, aneurysmal dilatation or acute abnormality. Portal veins are patent. IMPRESSION: 1. No acute abnormality in the  abdomen or pelvis. 2. Surgical changes of prior sleeve gastrectomy without evidence of complication. 3. IUD in place. Electronically Signed   By: Vilma Prader  Archer Asa M.D.   On: 03/15/2015 23:28         Subjective: Patient still complains of epigastric and right upper quadrant pain. Denies any fevers, chills, chest pain, shortness breath, vomiting, diarrhea, hematochezia, melena. Passing flatus but no bowel movement.  Objective: Filed Vitals:   03/16/15 0314 03/16/15 0708 03/16/15 2224 03/17/15 0601  BP: 103/68 112/53 96/57 109/57  Pulse: 73 71 70 81  Temp: 98.2 F (36.8 C) 97.8 F (36.6 C) 98.5 F (36.9 C) 98.6 F (37 C)  TempSrc: Oral Oral Oral Oral  Resp: Height:  (1.549 m)     Weight: 81.058 kg (178 lb 11.2 oz)     SpO2: 98% 100% 96% 97%    Intake/Output Summary (Last 24 hours) at 03/17/15 0747 Last data filed at 03/17/15 0604  Gross per 24 hour  Intake   1375 ml  Output    500 ml  Net    875 ml   Weight change:  Exam:   General:  Pt is alert, follows commands appropriately, not in acute distress  HEENT: No icterus, No thrush, No neck mass, [place]/AT  Cardiovascular: RRR, S1/S2, no rubs, no gallops  Respiratory: CTA bilaterally, no wheezing, no crackles, no rhonchi  Abdomen: Soft/+BS, epigastric pain with a feather touch., non distended, no guarding; no hepatosplenomegaly  Extremities: No edema, No lymphangitis, No petechiae, No rashes, no synovitis; no cyanosis or clubbing  Data Reviewed: Basic Metabolic Panel:  Recent Labs Lab 03/15/15 2055 03/16/15 1424 03/17/15 0500  NA 142  --  142  K 3.2*  --  3.9  CL 106  --  113*  CO2 26  --  19*  GLUCOSE 92  --  74  BUN 7  --  <5*  CREATININE 0.74 0.71 0.63  CALCIUM 9.2  --  8.7*   Liver Function Tests:  Recent Labs Lab 03/15/15 2055 03/17/15 0500  AST 67* 33  ALT 68* 46  ALKPHOS 105 77  BILITOT 0.7 0.7  PROT 7.5 5.2*  ALBUMIN 3.9 2.7*    Recent Labs Lab 03/15/15 2055  03/16/15 1424  LIPASE 93*  --   AMYLASE  --  64   No results for input(s): AMMONIA in the last 168 hours. CBC:  Recent Labs Lab 03/15/15 2055 03/16/15 1424 03/17/15 0500  WBC 4.6 3.8* 3.3*  HGB 11.9* 10.5* 9.7*  HCT 38.1 32.9* 31.7*  MCV 81.2 82.7 82.8  PLT 223 180 185   Cardiac Enzymes: No results for input(s): CKTOTAL, CKMB, CKMBINDEX, TROPONINI in the last 168 hours. BNP: Invalid input(s): POCBNP CBG: No results for input(s): GLUCAP in the last 168 hours.  No results found for this or any previous visit (from the past 240 hour(s)).   Scheduled Meds: . citalopram  10 mg Oral Daily  . heparin  5,000 Units Subcutaneous 3 times per day  . pantoprazole (PROTONIX) IV  40 mg Intravenous Q24H  . polyethylene glycol  17 g Oral BID   Continuous Infusions: . 0.9 % NaCl with KCl 20 mEq / L 125 mL/hr at 03/17/15 0239     Willey Due, DO  [facility] Pager [phone]  If 7PM-7AM, please contact night-coverage [email or web address] Password [facility] 03/17/2015, 7:47 AM   LOS: 1 day

## 2015-03-17 NOTE — Progress Notes (Signed)
[place] [facility] Progress Note  Chief Complaint: epigastric pain  Subjective History:  Feels the same, with diffuse, non-radiating upper abdominal pain, which is worse with the smallest amount of food or liquid No improvement in pain after treating constipation yesterday ROS: Cardiovascular:  no chest pain Respiratory: no dyspnea  Objective:  Med list reviewed IV heparin was just started b/c Hx of the portal vein branch thrombosis  Vital signs in last 24 hrs: Filed Vitals:   03/17/15 0601 03/17/15 0956  BP: 109/57 108/57  Pulse: 81 52  Temp: 98.6 F (37 C) 98.3 F (36.8 C)  Resp: 17 15    Physical Exam   HEENT: sclera anicteric, oral mucosa moist without lesions  Neck: supple, no thyromegaly, JVD or lymphadenopathy  Cardiac: RRR without murmurs, S1S2 heard, no peripheral edema  Pulm: clear to auscultation bilaterally, normal RR and effort noted  Abdomen: soft, moderate diffuse upper abd tenderness to very light palpation of the abd wall, with active bowel sounds. No guarding or palpable hepatosplenomegaly  Skin; warm and dry, no jaundice or rash  Recent Labs:   Recent Labs Lab 03/15/15 2055 03/16/15 1424 03/17/15 0500  WBC 4.6 3.8* 3.3*  HGB 11.9* 10.5* 9.7*  HCT 38.1 32.9* 31.7*  PLT 223 180 185    Recent Labs Lab 03/17/15 0500  NA 142  K 3.9  CL 113*  CO2 19*  BUN <5*  ALBUMIN 2.7*  ALKPHOS 77  ALT 46  AST 33  GLUCOSE 74    Bilirubin     Component Value Date/Time   BILITOT 0.7 03/17/2015 0500   BILIDIR <0.1* 08/28/2014 0226   IBILI NOT CALCULATED 08/28/2014 0226      @ASSESSMENTPLANBEGIN@ Assessment:  1- Epigastric pain - stable 2- RUQ pain - stable 3 - Elevated LFTs - resolved 4 - Constipation - improved  I still do not think she truly has pancreatitis.  Plan: After extensive discussion with patient, we have decided to proceed with EGD tomorrow.  I still believe it is unlikely to show us a cause for this pain, but we must rule  out what we can in order to move on with her plan.   Whether or not that includes cholecystectomy remains a decision between her and her surgeon. Meanwhile , pain control, IVF and bowel rest  The benefits and risks of the planned procedure were described in detail with the patient or (when appropriate) their health care proxy.  Risks were outlined as including, but not limited to, bleeding, infection, perforation, adverse medication reaction leading to cardiac or pulmonary decompensation, or pancreatitis (if ERCP).  The limitation of incomplete mucosal visualization was also discussed.  No guarantees or warranties were given.  Henry L Danis III Pager [phone] Mon-Fri 8a-5p [phone] after 5p, weekends, holidays  

## 2015-03-17 NOTE — Progress Notes (Signed)
ANTICOAGULATION CONSULT NOTE - Initial Consult  Pharmacy Consult for heparin Indication: portal vein thrombosis  Allergies  Allergen Reactions  . Reglan [Metoclopramide] Anxiety  . Morphine And Related Itching    Patient Measurements: Height:  (154.9 cm) Weight: 178 lb 11.2 oz (81.058 kg) (patient states that she weighs less) IBW/kg (Calculated) : 47.8 Heparin Dosing Weight: 66 kg  Vital Signs: Temp: 98.6 F (37 C) (02/16 0601) Temp Source: Oral (02/16 0601) BP: 109/57 mmHg (02/16 0601) Pulse Rate: 81 (02/16 0601)  Labs:  Recent Labs  03/15/15 2055 03/16/15 1424 03/17/15 0500  HGB 11.9* 10.5* 9.7*  HCT 38.1 32.9* 31.7*  PLT 223 180 185  CREATININE 0.74 0.71 0.63    Estimated Creatinine Clearance: 93.8 mL/min (by C-G formula based on Cr of 0.63).   Medical History: Past Medical History  Diagnosis Date  . Interstitial cystitis   . Tachycardia   . Heart valve regurgitation   . Pancreatitis   . Anxiety   . Ileus [facility])     Assessment: 35 yo F presents on 2/15 with abdominal pain. Has hx of recent gastric sleeve and hospitalizations for pancreatitis. Found to have portal vein thrombosis. Pharmacy consulted to start heparin. Hgb down to 9.7, plts wnl. No s/s of bleed. Last heparin [place] dose was this am at 0600. Will not bolus  Goal of Therapy:  Heparin level 0.3-0.7 units/ml Monitor platelets by anticoagulation protocol: Yes   Plan:  Stop heparin [place] Start heparin gtt at 1,150 units/hr Check 6 hr HL Monitor daily HL, CBC, s/s of bleed  Enzo Bi, PharmD, [facility] Clinical Pharmacist Pager [phone] 03/17/2015 8:13 AM

## 2015-03-17 NOTE — Progress Notes (Signed)
Pt is c/o pain and heaviness to right thigh. Assessment findings show right thigh to be bigger than the left one. Pain radiating to lower leg. MD paged

## 2015-03-17 NOTE — Progress Notes (Signed)
[facility] Progress Note     Subjective: Pt says she feels the same, still quite tender in her epigastrium.  No N/V.  NPO currently.  Ambulating oob.    Objective: Vital signs in last 24 hours: Temp:  [98.5 F (36.9 C)-98.6 F (37 C)] 98.6 F (37 C) (02/16 0601) Pulse Rate:  [70-81] 81 (02/16 0601) Resp:  [16-17] 17 (02/16 0601) BP: (96-109)/(57) 109/57 mmHg (02/16 0601) SpO2:  [96 %-97 %] 97 % (02/16 0601) Last BM Date: 03/04/15  Intake/Output from previous day: 02/15 0701 - 02/16 0700 In: 1375 [I.V.:1375] Out: 500 [Urine:500] Intake/Output this shift:    PE: Gen:  Alert, NAD, pleasant Abd: Soft, ND, tender in epigastrium, +BS, no HSM   Lab Results:   Recent Labs  03/16/15 1424 03/17/15 0500  WBC 3.8* 3.3*  HGB 10.5* 9.7*  HCT 32.9* 31.7*  PLT 180 185   BMET  Recent Labs  03/15/15 2055 03/16/15 1424 03/17/15 0500  NA 142  --  142  K 3.2*  --  3.9  CL 106  --  113*  CO2 26  --  19*  GLUCOSE 92  --  74  BUN 7  --  <5*  CREATININE 0.74 0.71 0.63  CALCIUM 9.2  --  8.7*   PT/INR No results for input(s): LABPROT, INR in the last 72 hours. CMP     Component Value Date/Time   NA 142 03/17/2015 0500   K 3.9 03/17/2015 0500   CL 113* 03/17/2015 0500   CO2 19* 03/17/2015 0500   GLUCOSE 74 03/17/2015 0500   BUN <5* 03/17/2015 0500   CREATININE 0.63 03/17/2015 0500   CALCIUM 8.7* 03/17/2015 0500   PROT 5.2* 03/17/2015 0500   ALBUMIN 2.7* 03/17/2015 0500   AST 33 03/17/2015 0500   ALT 46 03/17/2015 0500   ALKPHOS 77 03/17/2015 0500   BILITOT 0.7 03/17/2015 0500   GFRNONAA >60 03/17/2015 0500   GFRAA >60 03/17/2015 0500   Lipase     Component Value Date/Time   LIPASE 93* 03/15/2015 2055       Studies/Results: Ct Abdomen Pelvis W Contrast  03/15/2015  CLINICAL DATA:  35 year old female with a 5 week history of epigastric and right upper quadrant pain. 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 EXAM: CT ABDOMEN AND PELVIS WITH CONTRAST TECHNIQUE: Multidetector CT imaging of the abdomen and pelvis was performed using the standard protocol following bolus administration of intravenous contrast. CONTRAST:  25mL OMNIPAQUE IOHEXOL 300 MG/ML SOLN, OMNIPAQUE IOHEXOL 300 MG/ML SOLN COMPARISON:  Prior CT abdomen/ pelvis 02/17/2015 FINDINGS: Lower Chest: The lung bases are clear. Visualized cardiac structures are within normal limits for size. No pericardial effusion. Unremarkable visualized distal thoracic esophagus. Abdomen: Surgical changes of sleeve gastrectomy. No evidence of postoperative complication. The duodenum, spleen, adrenal glands and pancreas are all unremarkable. Normal hepatic contour and morphology. No discrete hepatic lesion. Gallbladder is unremarkable. No intra or extrahepatic biliary ductal dilatation. Unremarkable appearance of the bilateral kidneys. No focal solid lesion, hydronephrosis or nephrolithiasis. No evidence of obstruction or focal bowel wall thickening. Normal appendix in the right lower quadrant. The terminal ileum is unremarkable. No free fluid or suspicious adenopathy. Pelvis: IUD in the expected location. Otherwise, unremarkable uterus, at adnexa and bladder. No free fluid or suspicious adenopathy. Musculoskeletal: No acute fracture or aggressive appearing lytic or blastic osseous lesion. Vascular: No significant atherosclerotic vascular disease, aneurysmal dilatation or acute abnormality. Portal  veins are patent. IMPRESSION: 1. No acute abnormality in the abdomen or pelvis. 2. Surgical changes of prior sleeve gastrectomy without evidence of complication. 3. IUD in place. Electronically Signed   By: Malachy Moan M.D.   On: 03/15/2015 23:28    Anti-infectives: Anti-infectives    None       Assessment/Plan Abdominal pain/nausea Post-op Gastric Sleeve -Laparoscopic Sleeve in December 2016 - By Dr.  Buzzy Han in [facility] -May be a component of prolonged post-op pain -GI considering EGD Elevated lipase without evidence of pancreatitis on CT -Started in post-op period, etiology of pancreatitis is questionable, if it is truly pancreaitis at all. No reported alcohol or hypertriglyceridemia.  -Lipase has been as high as 577 on 02/17/15 when it was first rechecked after surgery.  Today it is 70 (the lowest its been) -In general would not want to operate with active pancreatitis.   -Ideally would not feed her until her pain is better ? Biliary dyskinesia -Reported pain with CCK administration. Normal HIDA with normal EF on 03/04/15 at [facility].  No stones and or sludge on [place] x2. She reports some [place] with sludge on it, but I can not find one that had sludge. -There are no urgent surgical indications. Would recommend waiting until pain resolved and lipase normalized.  Would probably be best to reschedule surgery with her surgeon at [place] since she is well established there and her recent surgery in December. Portal vein thrombus-diagnosed 1/17. Check PT/INR, hold coumadin    LOS: 1 day    Nonie Hoyer 03/17/2015, 9:04 AM Pager: [phone]

## 2015-03-17 NOTE — Progress Notes (Signed)
ANTICOAGULATION CONSULT NOTE - Initial Consult  Pharmacy Consult for heparin Indication: portal vein thrombosis  Allergies  Allergen Reactions  . Reglan [Metoclopramide] Anxiety  . Morphine And Related Itching    Patient Measurements: Height:  (154.9 cm) Weight: 178 lb 11.2 oz (81.058 kg) (patient states that she weighs less) IBW/kg (Calculated) : 47.8 Heparin Dosing Weight: 66 kg  Vital Signs: Temp: 98.2 F (36.8 C) (02/16 1401) Temp Source: Oral (02/16 1401) BP: 131/75 mmHg (02/16 1401) Pulse Rate: 84 (02/16 1401)  Labs:  Recent Labs  03/15/15 2055 03/16/15 1424 03/17/15 0500 03/17/15 1028 03/17/15 1508  HGB 11.9* 10.5* 9.7*  --   --   HCT 38.1 32.9* 31.7*  --   --   PLT 223 180 185  --   --   LABPROT  --   --   --  15.9*  --   INR  --   --   --  1.25  --   HEPARINUNFRC  --   --   --   --  0.88*  CREATININE 0.74 0.71 0.63  --   --     Estimated Creatinine Clearance: 93.8 mL/min (by C-G formula based on Cr of 0.63).   Medical History: Past Medical History  Diagnosis Date  . Interstitial cystitis   . Tachycardia   . Heart valve regurgitation   . Pancreatitis   . Anxiety   . Ileus [facility])     Assessment: 35 yo F presents on 2/15 with abdominal pain. Has hx of recent gastric sleeve and hospitalizations for pancreatitis. Found to have portal vein thrombosis. Pharmacy consulted to start heparin. Hgb down to 9.7, plts wnl. No s/s of bleed. Last heparin [place] dose was this am at 0600.   Initial heparin level slightly above goal at 0.88.  No bleeding or complications noted per chart notes.  Goal of Therapy:  Heparin level 0.3-0.7 units/ml Monitor platelets by anticoagulation protocol: Yes   Plan:  Decrease heparin to 1050 units/hr. Check 6 hr HL Monitor daily HL, CBC, s/s of bleed  Tad Moore, BCPS  Clinical Pharmacist Pager [phone]  03/17/2015 4:04 PM

## 2015-03-17 NOTE — Progress Notes (Signed)
Heparin drip initiated at 11.91ml/hr as ordered

## 2015-03-17 NOTE — Progress Notes (Signed)
Heparin drip rate reduced to 10.70ml/hr. scds also ordered for pt per her request

## 2015-03-18 ENCOUNTER — Inpatient Hospital Stay (HOSPITAL_COMMUNITY): Payer: BLUE CROSS/BLUE SHIELD

## 2015-03-18 ENCOUNTER — Inpatient Hospital Stay (HOSPITAL_COMMUNITY): Payer: BLUE CROSS/BLUE SHIELD | Admitting: Certified Registered Nurse Anesthetist

## 2015-03-18 ENCOUNTER — Encounter (HOSPITAL_COMMUNITY)
Admission: EM | Disposition: A | Discharge status: Home / Self Care | Payer: Self-pay | Source: Home / Self Care | Attending: Internal Medicine

## 2015-03-18 ENCOUNTER — Encounter (HOSPITAL_COMMUNITY): Payer: Self-pay | Admitting: *Deleted

## 2015-03-18 DIAGNOSIS — M79604 Pain in right leg: Secondary | ICD-10-CM

## 2015-03-18 DIAGNOSIS — R29898 Other symptoms and signs involving the musculoskeletal system: Secondary | ICD-10-CM | POA: Insufficient documentation

## 2015-03-18 HISTORY — PX: ESOPHAGOGASTRODUODENOSCOPY (EGD) WITH PROPOFOL: SHX5813

## 2015-03-18 LAB — CBC
HCT: 34.4 % — ABNORMAL LOW (ref 36.0–46.0)
Hemoglobin: 11.1 g/dL — ABNORMAL LOW (ref 12.0–15.0)
MCH: 26.2 pg (ref 26.0–34.0)
MCHC: 32.3 g/dL (ref 30.0–36.0)
MCV: 81.1 fL (ref 78.0–100.0)
Platelets: 175 10*3/uL (ref 150–400)
RBC: 4.24 MIL/uL (ref 3.87–5.11)
RDW: 16.3 % — ABNORMAL HIGH (ref 11.5–15.5)
WBC: 4.9 10*3/uL (ref 4.0–10.5)

## 2015-03-18 LAB — COMPREHENSIVE METABOLIC PANEL
ALT: 43 U/L (ref 14–54)
AST: 29 U/L (ref 15–41)
Albumin: 3.1 g/dL — ABNORMAL LOW (ref 3.5–5.0)
Alkaline Phosphatase: 96 U/L (ref 38–126)
Anion gap: 9 (ref 5–15)
BUN: <5 mg/dL — ABNORMAL LOW (ref 6–20)
CO2: 21 mmol/L — ABNORMAL LOW (ref 22–32)
Calcium: 9 mg/dL (ref 8.9–10.3)
Chloride: 111 mmol/L (ref 101–111)
Creatinine, Ser: 0.76 mg/dL (ref 0.44–1.00)
GFR calc Af Amer: >60 mL/min (ref >60)
GFR calc non Af Amer: >60 mL/min (ref >60)
Glucose, Bld: 64 mg/dL — ABNORMAL LOW (ref 65–99)
Potassium: 4.1 mmol/L (ref 3.5–5.1)
Sodium: 141 mmol/L (ref 135–145)
Total Bilirubin: 0.7 mg/dL (ref 0.3–1.2)
Total Protein: 6.5 g/dL (ref 6.5–8.1)

## 2015-03-18 LAB — HIV ANTIBODY (ROUTINE TESTING W REFLEX): HIV Screen 4th Generation wRfx: NONREACTIVE

## 2015-03-18 LAB — MAGNESIUM: Magnesium: 1.6 mg/dL — ABNORMAL LOW (ref 1.7–2.4)

## 2015-03-18 LAB — LIPASE, BLOOD: Lipase: 49 U/L (ref 11–51)

## 2015-03-18 LAB — URINE CULTURE

## 2015-03-18 LAB — CK: Total CK: 73 U/L (ref 38–234)

## 2015-03-18 LAB — HEPARIN LEVEL (UNFRACTIONATED)
Heparin Unfractionated: 0.59 IU/mL (ref 0.30–0.70)
Heparin Unfractionated: 0.2 IU/mL — ABNORMAL LOW (ref 0.30–0.70)

## 2015-03-18 LAB — LACTIC ACID, PLASMA: Lactic Acid, Venous: 0.7 mmol/L (ref 0.5–2.0)

## 2015-03-18 SURGERY — ESOPHAGOGASTRODUODENOSCOPY (EGD) WITH PROPOFOL
Anesthesia: Monitor Anesthesia Care

## 2015-03-18 MED ORDER — LACTATED RINGERS IV SOLN
INTRAVENOUS | Status: DC | PRN
  Administered 2015-03-18: 13:00:00 via INTRAVENOUS

## 2015-03-18 MED ORDER — BUTAMBEN-TETRACAINE-BENZOCAINE 2-2-14 % EX AERO
INHALATION_SPRAY | CUTANEOUS | Status: DC | PRN
  Administered 2015-03-18: 2 via TOPICAL

## 2015-03-18 MED ORDER — PROPOFOL 10 MG/ML IV BOLUS
INTRAVENOUS | Status: DC | PRN
  Administered 2015-03-18: 30 mg via INTRAVENOUS
  Administered 2015-03-18: 20 mg via INTRAVENOUS

## 2015-03-18 MED ORDER — HEPARIN (PORCINE) IN NACL 100-0.45 UNIT/ML-% IJ SOLN
1050.0000 [IU]/h | INTRAMUSCULAR | Status: DC
  Administered 2015-03-18: 1050 [IU]/h via INTRAVENOUS
  Filled 2015-03-18 (×2): qty 250

## 2015-03-18 MED ORDER — PROPOFOL 500 MG/50ML IV EMUL
INTRAVENOUS | Status: DC | PRN
  Administered 2015-03-18: 100 ug/kg/min via INTRAVENOUS

## 2015-03-18 MED ORDER — ALPRAZOLAM 0.25 MG PO TABS
0.2500 mg | ORAL_TABLET | Freq: Once | ORAL | Status: AC
  Administered 2015-03-18: 0.25 mg via ORAL
  Filled 2015-03-18: qty 1

## 2015-03-18 MED ORDER — UNJURY CHICKEN SOUP POWDER
2.0000 [oz_av] | Freq: Four times a day (QID) | ORAL | Status: DC
  Administered 2015-03-18 (×2): 2 [oz_av] via ORAL
  Filled 2015-03-18 (×7): qty 27

## 2015-03-18 NOTE — Progress Notes (Signed)
Earlier in the evening pt complained of feeling that she could not move her right leg and that it was numb in thigh. Pt stated she felt very anxious at the time. Nurse did see patient move her toes and leg upon entering the room. Pulses equal and 2+ bilaterally in distal LE, and good color. Pt has [place] of right LE pending due to complaints of swelling. Pt has since gotten up to bathroom twice and neurovascular exam remains unchanged per nursing. Treated anxiety, will notify day team of complaints however at this point symptoms appears significantly improved.

## 2015-03-18 NOTE — Transfer of Care (Signed)
Immediate Anesthesia Transfer of Care Note  Patient: Laura Shah  Procedure(s) Performed: Procedure(s): ESOPHAGOGASTRODUODENOSCOPY (EGD) WITH PROPOFOL (N/A)  Patient Location: Endoscopy Unit  Anesthesia Type:MAC  Level of Consciousness: sedated  Airway & Oxygen Therapy: Patient Spontanous Breathing and Patient connected to nasal cannula oxygen  Post-op Assessment: Report given to RN and Post -op Vital signs reviewed and stable  Post vital signs: Reviewed and stable  Last Vitals:  Filed Vitals:   03/18/15 0545 03/18/15 1158  BP: 119/62 119/65  Pulse: 82 90  Temp: 36.9 C 36.8 C  Resp: 18 20    Complications: No apparent anesthesia complications

## 2015-03-18 NOTE — Progress Notes (Signed)
VASCULAR LAB PRELIMINARY  PRELIMINARY  PRELIMINARY  PRELIMINARY   Right lower extremity Venous Doppler has been completed.    Right:  No evidence of DVT, superficial thrombosis, or Baker's cyst.  Jenetta Loges, RVT, RDMS 03/18/2015, 3:43 PM

## 2015-03-18 NOTE — Anesthesia Postprocedure Evaluation (Signed)
Anesthesia Post Note  Patient: Laura Shah  Procedure(s) Performed: Procedure(s) (LRB): ESOPHAGOGASTRODUODENOSCOPY (EGD) WITH PROPOFOL (N/A)  Patient location during evaluation: Endoscopy Anesthesia Type: MAC Level of consciousness: awake and alert Pain management: pain level controlled Vital Signs Assessment: post-procedure vital signs reviewed and stable Respiratory status: spontaneous breathing, nonlabored ventilation, respiratory function stable and patient connected to nasal cannula oxygen Cardiovascular status: stable and blood pressure returned to baseline Anesthetic complications: no    Last Vitals:  Filed Vitals:   03/18/15 1330 03/18/15 1340  BP: 114/52 120/49  Pulse: 86 81  Temp:    Resp: 22 22    Last Pain:  Filed Vitals:   03/18/15 1532  PainSc: 4                  Cecile Hearing

## 2015-03-18 NOTE — Interval H&P Note (Signed)
History and Physical Interval Note:  03/18/2015 12:52 PM  Laura Shah  has presented today for surgery, with the diagnosis of Epigastric pain  The various methods of treatment have been discussed with the patient and family. After consideration of risks, benefits and other options for treatment, the patient has consented to  Procedure(s): ESOPHAGOGASTRODUODENOSCOPY (EGD) WITH PROPOFOL (N/A) as a surgical intervention .  The patient's history has been reviewed, patient examined, no change in status, stable for surgery.  I have reviewed the patient's chart and labs.  Questions were answered to the patient's satisfaction.     Charlie Pitter III

## 2015-03-18 NOTE — Progress Notes (Signed)
ANTICOAGULATION CONSULT NOTE  Pharmacy Consult:  Heparin Indication:  Portal vein thrombosis  Allergies  Allergen Reactions  . Reglan [Metoclopramide] Anxiety  . Morphine And Related Itching    Patient Measurements: Height:  (154.9 cm) Weight: 178 lb (80.74 kg) IBW/kg (Calculated) : 47.8 Heparin Dosing Weight: 66 kg  Vital Signs: Temp: 98.4 F (36.9 C) (02/17 1319) Temp Source: Oral (02/17 1319) BP: 120/49 mmHg (02/17 1340) Pulse Rate: 81 (02/17 1340)  Labs:  Recent Labs  03/16/15 1424 03/17/15 0500 03/17/15 1028 03/17/15 1508 03/17/15 2250 03/18/15 0745  HGB 10.5* 9.7*  --   --   --  11.1*  HCT 32.9* 31.7*  --   --   --  34.4*  PLT 180 185  --   --   --  175  LABPROT  --   --  15.9*  --   --   --   INR  --   --  1.25  --   --   --   HEPARINUNFRC  --   --   --  0.88* 0.73* 0.59  CREATININE 0.71 0.63  --   --   --  0.76  CKTOTAL  --   --   --   --   --  73    Estimated Creatinine Clearance: 93.6 mL/min (by C-G formula based on Cr of 0.76).   Assessment: 1 YOF with portal vein thrombosis.  She is s/p EGD today and heparin got resume.  She was previously therapeutic on 1050 units/hr.  No bleeding reported.   Goal of Therapy:  Heparin level 0.3-0.7 units/ml Monitor platelets by anticoagulation protocol: Yes    Plan:  - Resume heparin gtt at 1050 units/hr - Check 6 hr HL - Daily HL / CBC - F/U mag supplementation   Haely Leyland D. Laney Potash, PharmD, BCPS Pager:  [phone] 03/18/2015, 2:36 PM

## 2015-03-18 NOTE — Progress Notes (Signed)
ANTICOAGULATION CONSULT NOTE  Pharmacy Consult:  Heparin Indication:  Portal vein thrombosis  Allergies  Allergen Reactions  . Reglan [Metoclopramide] Anxiety  . Morphine And Related Itching    Patient Measurements: Height:  (154.9 cm) Weight: 178 lb (80.74 kg) IBW/kg (Calculated) : 47.8 Heparin Dosing Weight: 66 kg  Vital Signs: Temp: 98.5 F (36.9 C) (02/17 2137) Temp Source: Oral (02/17 2137) BP: 107/53 mmHg (02/17 2137) Pulse Rate: 80 (02/17 2137)  Labs:  Recent Labs  03/16/15 1424 03/17/15 0500 03/17/15 1028  03/17/15 2250 03/18/15 0745 03/18/15 2118  HGB 10.5* 9.7*  --   --   --  11.1*  --   HCT 32.9* 31.7*  --   --   --  34.4*  --   PLT 180 185  --   --   --  175  --   LABPROT  --   --  15.9*  --   --   --   --   INR  --   --  1.25  --   --   --   --   HEPARINUNFRC  --   --   --   < > 0.73* 0.59 0.20*  CREATININE 0.71 0.63  --   --   --  0.76  --   CKTOTAL  --   --   --   --   --  73  --   < > = values in this interval not displayed.  Estimated Creatinine Clearance: 93.6 mL/min (by C-G formula based on Cr of 0.76).   Assessment: 72 YOF with portal vein thrombosis.  She is s/p EGD today and heparin got resume.  She was previously therapeutic on 1050 units/hr.  No bleeding reported. Heparin drip 1050 ts/hr HL 0.2 < goal  Goal of Therapy:  Heparin level 0.3-0.7 units/ml Monitor platelets by anticoagulation protocol: Yes    Plan:  - Increase heparin gtt to 1150 units/hrL - Daily HL / CBC    Leota Sauers Pharm.D. CPP, BCPS Clinical Pharmacist [phone] 03/18/2015 9:59 PM

## 2015-03-18 NOTE — Progress Notes (Signed)
Brief Nutrition Follow-Up Note  Noted pt has been advanced to a Heart Healthy diet. This RD will add Unjury supplement.   Will continue to follow.   Jaely Silman A. Mayford Knife, RD, LDN, CDE Pager: [phone] After hours Pager: [phone]

## 2015-03-18 NOTE — Progress Notes (Signed)
PROGRESS NOTE  Laura Shah EAV:[id number] DOB: 1980-09-01 DOA: 03/15/2015 PCP: Neldon Labella, MD  Brief History 35 year old female with a history of interstitial cystitis, and recent gastric sleeve surgery on 01/27/2015 at [facility]. Postoperatively, The patient had to be hospitalized twice for pancreatitis. Most recently, the patient was discharged from the hospital after workup for her gallbladder. She had a right upper quadrant ultrasound on 02/18/2015 and 03/04/15 which did not reveal any sludge or stones contrary to pt's claim of sludge. HIDA scan revealed patent ducts with EF of 74%. However, the patient claimed that she had pain with injection of CCK. The patient was also recently diagnosed with a portal vein thrombosis noted on a CT scan on 02/05/2015. She saw hematology, Dr. Foy Guadalajara, at [facility] whom recommended anticoagulation for at least 3 months. The patient was started on Lovenox 1 mg/kg Peck bid and states she has been compliant up to the hospitalization. She presented to the ED with worsening abdominal pain with nausea and vomiting for 2 day period. Assessment/Plan: Abdominal pain/nausea and vomiting -pain appears to be out of proportion with clinical exam -there appears to be a delayed pain response of 3-4 seconds during exam -03/15/2015 CT abdomen and pelvis--no acute findings, unremarkable gallbladder -not convinced this is pancreatitis -?constipation--no BM x 2 weeks -appreciate surgical input -appreciate GI input -pt claims vomiting evening of 03/18/15, but not witnessed by RN -recent RUQ [place] and HIDA as above Elevated Lipase -Not convinced the patient has pancreatitis with minimal elevation  -clinical syndrome not consistent with pancreatitis -start clear liquids after EGD Portal vein thrombosis -start heparin IV for now -plan to d/c home with lovenox as per her hematologist recommendation Pyuria -add urine culture -check  UDS--positive opiates Anxiety -continue celexa when able to tolerate po Transaminasemia -improved -check HIV-- Right leg pain/weakness/heaviness -Duplex right leg--pending -MRI brain-no acute infarct; nonspecific white matter T2 and FLAIR signal  Family Communication: Pt at beside Disposition Plan: Home 1-2 days    Procedures/Studies: Ct Abdomen Pelvis W Contrast  03/15/2015  CLINICAL DATA:  35 year old female with a 5 week history of epigastric and right upper quadrant pain. 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 EXAM: CT ABDOMEN AND PELVIS WITH CONTRAST TECHNIQUE: Multidetector CT imaging of the abdomen and pelvis was performed using the standard protocol following bolus administration of intravenous contrast. CONTRAST:  25mL OMNIPAQUE IOHEXOL 300 MG/ML SOLN, OMNIPAQUE IOHEXOL 300 MG/ML SOLN COMPARISON:  Prior CT abdomen/ pelvis 02/17/2015 FINDINGS: Lower Chest: The lung bases are clear. Visualized cardiac structures are within normal limits for size. No pericardial effusion. Unremarkable visualized distal thoracic esophagus. Abdomen: Surgical changes of sleeve gastrectomy. No evidence of postoperative complication. The duodenum, spleen, adrenal glands and pancreas are all unremarkable. Normal hepatic contour and morphology. No discrete hepatic lesion. Gallbladder is unremarkable. No intra or extrahepatic biliary ductal dilatation. Unremarkable appearance of the bilateral kidneys. No focal solid lesion, hydronephrosis or nephrolithiasis. No evidence of obstruction or focal bowel wall thickening. Normal appendix in the right lower quadrant. The terminal ileum is unremarkable. No free fluid or suspicious adenopathy. Pelvis: IUD in the expected location. Otherwise, unremarkable uterus, at adnexa and bladder. No free fluid or suspicious adenopathy. Musculoskeletal: No acute fracture or aggressive appearing  lytic or blastic osseous lesion. Vascular: No significant atherosclerotic vascular disease, aneurysmal dilatation or acute abnormality. Portal veins are patent. IMPRESSION: 1. No acute abnormality  in the abdomen or pelvis. 2. Surgical changes of prior sleeve gastrectomy without evidence of complication. 3. IUD in place. Electronically Signed   By: Malachy Moan M.D.   On: 03/15/2015 23:28         Subjective: Patient complains of right leg heaviness, numbness, and pain. Denies any headache, visual disturbance, upper extremity weakness, fevers, chills, chest pain, stress, diarrhea. She complains of some nausea and one episode of emesis overnight. No hematochezia or hematemesis. No dysuria.  Objective: Filed Vitals:   03/17/15 1401 03/17/15 2140 03/17/15 2324 03/18/15 0545  BP: 131/75 125/71 115/67 119/62  Pulse: 84 85 101 82  Temp: 98.2 F (36.8 C) 98.7 F (37.1 C)  98.5 F (36.9 C)  TempSrc: Oral Oral  Oral  Resp: Height:      Weight:      SpO2: 98% 100% 98% 99%    Intake/Output Summary (Last 24 hours) at 03/18/15 0911 Last data filed at 03/18/15 0800  Gross per 24 hour  Intake    595 ml  Output   2000 ml  Net  -1405 ml   Weight change:  Exam:   General:  Pt is alert, follows commands appropriately, not in acute distress  HEENT: No icterus, No thrush, No neck mass, [place]/AT  Cardiovascular: RRR, S1/S2, no rubs, no gallops  Respiratory: CTA bilaterally, no wheezing, no crackles, no rhonchi  Abdomen: Soft/+BS, epigastric tenderness with light touch, non distended, no guarding; delayed pain response of 3-4 seconds after palpation.  Extremities: No edema, No lymphangitis, No petechiae, No rashes, no synovitis; no clubbing or cyanosis  Data Reviewed: Basic Metabolic Panel:  Recent Labs Lab 03/15/15 2055 03/16/15 1424 03/17/15 0500  NA 142  --  142  K 3.2*  --  3.9  CL 106  --  113*  CO2 26  --  19*  GLUCOSE 92  --  74  BUN 7  --  <5*  CREATININE  0.74 0.71 0.63  CALCIUM 9.2  --  8.7*   Liver Function Tests:  Recent Labs Lab 03/15/15 2055 03/17/15 0500  AST 67* 33  ALT 68* 46  ALKPHOS 105 77  BILITOT 0.7 0.7  PROT 7.5 5.2*  ALBUMIN 3.9 2.7*    Recent Labs Lab 03/15/15 2055 03/16/15 1424 03/17/15 0500  LIPASE 93*  --  53*  AMYLASE  --  64  --    No results for input(s): AMMONIA in the last 168 hours. CBC:  Recent Labs Lab 03/15/15 2055 03/16/15 1424 03/17/15 0500 03/18/15 0745  WBC 4.6 3.8* 3.3* 4.9  HGB 11.9* 10.5* 9.7* 11.1*  HCT 38.1 32.9* 31.7* 34.4*  MCV 81.2 82.7 82.8 81.1  PLT 223 180 185 175   Cardiac Enzymes: No results for input(s): CKTOTAL, CKMB, CKMBINDEX, TROPONINI in the last 168 hours. BNP: Invalid input(s): POCBNP CBG:  Recent Labs Lab 03/17/15 0736 03/17/15 1714  GLUCAP 68 72    No results found for this or any previous visit (from the past 240 hour(s)).   Scheduled Meds: . citalopram  10 mg Oral Daily  . pantoprazole (PROTONIX) IV  40 mg Intravenous Q24H  . polyethylene glycol  17 g Oral BID   Continuous Infusions: . 0.9 % NaCl with KCl 20 mEq / L 125 mL/hr at 03/18/15 0303     Sehaj Mcenroe, DO  [facility] Pager [phone]  If 7PM-7AM, please contact night-coverage [email or web address] Password [email or web address] 03/18/2015, 9:11 AM   LOS: 2 days

## 2015-03-18 NOTE — Progress Notes (Signed)
Day of Surgery   [facility] Subjective: Reports epigastric and RUQ abdominal pain persists and continues to radiate to right shoulder and (thoracic) back. Endorses nausea with yellow emesis over night with persistent nausea and abdominal pain this morning. Is NPO and schedule to have EGD today with GI service. Also reports continued RLE discomfort with an associated tightness and heaviness of this extremity, however remains ambulatory. Denies chest pain, dyspnea or dyspnea upon exertion. Is scheduled for U/S of RLE today. Objective: Vital signs in last 24 hours: Temp:  [98.2 F (36.8 C)-98.7 F (37.1 C)] 98.5 F (36.9 C) (02/17 0545) Pulse Rate:  [52-101] 82 (02/17 0545) Resp:  [15-18] 18 (02/17 0545) BP: (108-131)/(57-75) 119/62 mmHg (02/17 0545) SpO2:  [97 %-100 %] 99 % (02/17 0545) Last BM Date: 03/04/15  Intake/Output from previous day: 02/16 0701 - 02/17 0700 In: 120 [P.O.:120] Out: 2000 [Urine:2000] Intake/Output this shift:    PE: Gen: seated comfortably in bed. NAD CV: RRR. Calf circumference grossly equal at bilateral lower extremities. No pedal edema or skin changes. DP and PT pulses 2+ B/L. Mild tenderness of medial calf and thigh on RLE. Pulm: CTA B/L Abd: +moderate tenderness of epigastric and RUQ area of abdomen. Soft without distension. NABS.   Lab Results:   Recent Labs  03/16/15 1424 03/17/15 0500  WBC 3.8* 3.3*  HGB 10.5* 9.7*  HCT 32.9* 31.7*  PLT 180 185   BMET  Recent Labs  03/15/15 2055 03/16/15 1424 03/17/15 0500  NA 142  --  142  K 3.2*  --  3.9  CL 106  --  113*  CO2 26  --  19*  GLUCOSE 92  --  74  BUN 7  --  <5*  CREATININE 0.74 0.71 0.63  CALCIUM 9.2  --  8.7*   PT/INR  Recent Labs  03/17/15 1028  LABPROT 15.9*  INR 1.25   CMP     Component Value Date/Time   NA 142 03/17/2015 0500   K 3.9 03/17/2015 0500   CL 113* 03/17/2015 0500   CO2 19* 03/17/2015 0500   GLUCOSE 74 03/17/2015 0500   BUN <5*  03/17/2015 0500   CREATININE 0.63 03/17/2015 0500   CALCIUM 8.7* 03/17/2015 0500   PROT 5.2* 03/17/2015 0500   ALBUMIN 2.7* 03/17/2015 0500   AST 33 03/17/2015 0500   ALT 46 03/17/2015 0500   ALKPHOS 77 03/17/2015 0500   BILITOT 0.7 03/17/2015 0500   GFRNONAA >60 03/17/2015 0500   GFRAA >60 03/17/2015 0500   Lipase     Component Value Date/Time   LIPASE 53* 03/17/2015 0500       Studies/Results: No results found.  Anti-infectives: Anti-infectives    None       Assessment/Plan Abdominal pain/nausea/vomiting Post-op Gastric Sleeve -Laparoscopic Sleeve in December 2016 - By Dr. Buzzy Han in [facility] -May be a component of prolonged post-op pain.  -GI service to perform EGD today. Currently NPO. Elevated lipase without evidence of pancreatitis on CT -Started in post-op period, etiology of pancreatitis is questionable, if it is truly pancreaitis at all. No reported alcohol or hypertriglyceridemia.  -Lipase has been as high as 577 on 02/17/15 when it was first rechecked after surgery.Lipase trending toward normal with today's labs pending (CMET/lipase). No leukocytosis or fever. -In general would not want to operate with active pancreatitis.  ? Biliary dyskinesia -Reported pain with CCK administration. Normal HIDA with normal EF on 03/04/15 at [facility]. No stones and or sludge on [place]  x2. She reports some [place] with sludge on it, but I can not find one that had sludge. -There are no urgent surgical indications. Would recommend waiting until pain resolved and lipase normalized. Would probably be best to reschedule surgery with her surgeon in [facility], [place] since she is well established there following her recent surgery in December 2016. 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 Check PT/INR, hold coumadin. Currently on heparin drip. GI: NPO on IV Protonix   LOS: 2 days    LEE Slayter Moorhouse, [facility] 03/18/2015, 7:35 AM

## 2015-03-18 NOTE — Anesthesia Preprocedure Evaluation (Addendum)
Anesthesia Evaluation  Patient identified by MRN, date of birth, ID band Patient awake    Reviewed: Allergy & Precautions, NPO status , Patient's Chart, lab work & pertinent test results  History of Anesthesia Complications Negative for: history of anesthetic complications  Airway Mallampati: III  TM Distance: >3 FB Neck ROM: Full    Dental no notable dental hx. (+) Teeth Intact   Pulmonary neg pulmonary ROS, sleep apnea ,    Pulmonary exam normal breath sounds clear to auscultation       Cardiovascular (-) hypertensionnegative cardio ROS  + Valvular Problems/Murmurs  Rhythm:Regular Rate:Normal     Neuro/Psych PSYCHIATRIC DISORDERS Anxiety negative neurological ROS     GI/Hepatic GERD  Medicated,Portal vein thrombosis Pancreatitis gastric sleeve surgery done on 01/27/2015    Endo/Other  Obesity   Renal/GU negative Renal ROS   Interstitial cystitis    Musculoskeletal negative musculoskeletal ROS (+)   Abdominal (+) + obese,   Peds  Hematology  (+) Blood dyscrasia, anemia ,   Anesthesia Other Findings   Reproductive/Obstetrics negative OB ROS                         Anesthesia Physical Anesthesia Plan  ASA: II  Anesthesia Plan: MAC   Post-op Pain Management:    Induction: Intravenous  Airway Management Planned: Nasal Cannula  Additional Equipment: None  Intra-op Plan:   Post-operative Plan:   Informed Consent: I have reviewed the patients History and Physical, chart, labs and discussed the procedure including the risks, benefits and alternatives for the proposed anesthesia with the patient or authorized representative who has indicated his/her understanding and acceptance.   Dental advisory given  Plan Discussed with: CRNA and Anesthesiologist  Anesthesia Plan Comments:         Anesthesia Quick Evaluation

## 2015-03-18 NOTE — H&P (View-Only) (Signed)
[facility] Progress Note  Chief Complaint: epigastric pain  Subjective History:  Feels the same, with diffuse, non-radiating upper abdominal pain, which is worse with the smallest amount of food or liquid No improvement in pain after treating constipation yesterday ROS: Cardiovascular:  no chest pain Respiratory: no dyspnea  Objective:  Med list reviewed IV heparin was just started b/c Hx of the portal vein branch thrombosis  Vital signs in last 24 hrs: Filed Vitals:   03/17/15 0601 03/17/15 0956  BP: 109/57 108/57  Pulse: 81 52  Temp: 98.6 F (37 C) 98.3 F (36.8 C)  Resp: 17 15    Physical Exam   HEENT: sclera anicteric, oral mucosa moist without lesions  Neck: supple, no thyromegaly, JVD or lymphadenopathy  Cardiac: RRR without murmurs, S1S2 heard, no peripheral edema  Pulm: clear to auscultation bilaterally, normal RR and effort noted  Abdomen: soft, moderate diffuse upper abd tenderness to very light palpation of the abd wall, with active bowel sounds. No guarding or palpable hepatosplenomegaly  Skin; warm and dry, no jaundice or rash  Recent Labs:   Recent Labs Lab 03/15/15 2055 03/16/15 1424 03/17/15 0500  WBC 4.6 3.8* 3.3*  HGB 11.9* 10.5* 9.7*  HCT 38.1 32.9* 31.7*  PLT 223 180 185    Recent Labs Lab 03/17/15 0500  NA 142  K 3.9  CL 113*  CO2 19*  BUN <5*  ALBUMIN 2.7*  ALKPHOS 77  ALT 46  AST 33  GLUCOSE 74    Bilirubin     Component Value Date/Time   BILITOT 0.7 03/17/2015 0500   BILIDIR <0.1* 08/28/2014 0226   IBILI NOT CALCULATED 08/28/2014 0226      @ Assessment:  1- Epigastric pain - stable 2- RUQ pain - stable 3 - Elevated LFTs - resolved 4 - Constipation - improved  I still do not think she truly has pancreatitis.  Plan: After extensive discussion with patient, we have decided to proceed with EGD tomorrow.  I still believe it is unlikely to show [place] a cause for this pain, but we must rule  out what we can in order to move on with her plan.   Whether or not that includes cholecystectomy remains a decision between her and her surgeon. Meanwhile , pain control, IVF and bowel rest  The benefits and risks of the planned procedure were described in detail with the patient or (when appropriate) their health care proxy.  Risks were outlined as including, but not limited to, bleeding, infection, perforation, adverse medication reaction leading to cardiac or pulmonary decompensation, or pancreatitis (if ERCP).  The limitation of incomplete mucosal visualization was also discussed.  No guarantees or warranties were given.  Charlie Pitter III Pager [phone] Mon-Fri 8a-5p [phone] after 5p, weekends, holidays

## 2015-03-18 NOTE — Progress Notes (Signed)
ANTICOAGULATION CONSULT NOTE  Pharmacy Consult for heparin Indication: portal vein thrombosis  Allergies  Allergen Reactions  . Reglan [Metoclopramide] Anxiety  . Morphine And Related Itching    Patient Measurements: Height:  (154.9 cm) Weight: 178 lb 11.2 oz (81.058 kg) (patient states that she weighs less) IBW/kg (Calculated) : 47.8 Heparin Dosing Weight: 66 kg  Vital Signs: Temp: 98.7 F (37.1 C) (02/16 2140) Temp Source: Oral (02/16 2140) BP: 115/67 mmHg (02/16 2324) Pulse Rate: 101 (02/16 2324)  Labs:  Recent Labs  03/15/15 2055 03/16/15 1424 03/17/15 0500 03/17/15 1028 03/17/15 1508 03/17/15 2250  HGB 11.9* 10.5* 9.7*  --   --   --   HCT 38.1 32.9* 31.7*  --   --   --   PLT 223 180 185  --   --   --   LABPROT  --   --   --  15.9*  --   --   INR  --   --   --  1.25  --   --   HEPARINUNFRC  --   --   --   --  0.88* 0.73*  CREATININE 0.74 0.71 0.63  --   --   --     Estimated Creatinine Clearance: 93.8 mL/min (by C-G formula based on Cr of 0.63).  Assessment: 35 yo Female with portal vein thrombosis for heparin  Goal of Therapy:  Heparin level 0.3-0.7 units/ml Monitor platelets by anticoagulation protocol: Yes   Plan:  Decrease heparin to 950 units/hr. Follow-up am labs.  Geannie Risen, PharmD, BCPS   03/18/2015 12:26 AM

## 2015-03-18 NOTE — Op Note (Signed)
Laura Shah [address] [place], [address]   ENDOSCOPY PROCEDURE REPORT  PATIENT: Laura, Shah  MR#: [id number] BIRTHDATE: 10-07-1980 , 34  yrs. old GENDER: female ENDOSCOPIST: Amada Jupiter, MD REFERRED BY: PROCEDURE DATE:  03/18/2015 PROCEDURE:  EGD, diagnostic ASA CLASS:     Class II INDICATIONS:  epigastric abdominal pain. MEDICATIONS: Monitored anesthesia care TOPICAL ANESTHETIC: none  DESCRIPTION OF PROCEDURE: After the risks benefits and alternatives of the procedure were thoroughly explained, informed consent was obtained.  The Pentax Gastroscope [id number] endoscope was introduced through the mouth and advanced to the second portion of the duodenum , Without limitations.  The instrument was slowly withdrawn as the mucosa was fully examined.    Esophagus: normal  STOMACH: The patient has had a gastric sleeve procedure. It was well-healed, and no visible abnormalities were seen. Retroflexion was not performed.     The scope was then withdrawn from the patient and the procedure completed.  Duodenum: normal  COMPLICATIONS: There were no immediate complications.  ENDOSCOPIC IMPRESSION: The patient has had a gastric sleeve procedure. It was well-healed, and no visible abnormalities were seen  RECOMMENDATIONS: Return to surgeon   eSigned:  Amada Jupiter, MD 03/18/2015 1:13 PM r

## 2015-03-19 DIAGNOSIS — E876 Hypokalemia: Secondary | ICD-10-CM

## 2015-03-19 LAB — HEPARIN LEVEL (UNFRACTIONATED): Heparin Unfractionated: 0.35 IU/mL (ref 0.30–0.70)

## 2015-03-19 LAB — CBC
HCT: 31.8 % — ABNORMAL LOW (ref 36.0–46.0)
Hemoglobin: 9.8 g/dL — ABNORMAL LOW (ref 12.0–15.0)
MCH: 25.2 pg — ABNORMAL LOW (ref 26.0–34.0)
MCHC: 30.8 g/dL (ref 30.0–36.0)
MCV: 81.7 fL (ref 78.0–100.0)
Platelets: 186 10*3/uL (ref 150–400)
RBC: 3.89 MIL/uL (ref 3.87–5.11)
RDW: 16.4 % — ABNORMAL HIGH (ref 11.5–15.5)
WBC: 4.6 10*3/uL (ref 4.0–10.5)

## 2015-03-19 LAB — VITAMIN B12: Vitamin B-12: 323 pg/mL (ref 180–914)

## 2015-03-19 MED ORDER — HYDROCODONE-ACETAMINOPHEN 5-325 MG PO TABS: 1.0000 | ORAL_TABLET | Freq: Four times a day (QID) | ORAL | Status: DC | PRN

## 2015-03-19 MED ORDER — ENOXAPARIN SODIUM 80 MG/0.8ML ~~LOC~~ SOLN
1.0000 mg/kg | Freq: Two times a day (BID) | SUBCUTANEOUS | Status: DC
  Administered 2015-03-19: 80 mg via SUBCUTANEOUS
  Filled 2015-03-19: qty 0.8

## 2015-03-19 MED ORDER — MAGNESIUM SULFATE 2 GM/50ML IV SOLN
2.0000 g | Freq: Once | INTRAVENOUS | Status: AC
  Administered 2015-03-19: 2 g via INTRAVENOUS
  Filled 2015-03-19: qty 50

## 2015-03-19 NOTE — Discharge Summary (Addendum)
Physician Discharge Summary  Laura Shah ZOX:[id number] DOB: 06-30-1980 DOA: 03/15/2015  PCP: Neldon Labella, MD  Admit date: 03/15/2015 Discharge date: 03/19/2015  Recommendations for Outpatient Follow-up:  1. Pt will need to follow up with PCP in 2 weeks post discharge   Discharge Diagnoses:  Abdominal pain/nausea and vomiting -pain continues to be out of proportion with clinical exam -there appears to be a delayed pain response of 3-4 seconds during exam -03/15/2015 CT abdomen and pelvis--no acute findings, unremarkable gallbladder -not convinced this is pancreatitis -?constipation--no BM x 2 weeks -appreciate surgical input -appreciate GI input -03/18/15--EGD--no visible abnormalities, well healed surgical site -pt claims vomiting evening of 03/18/15, but not witnessed by RN -recent RUQ [place] and HIDA unremarkable prior to admission -pt needs regular bowel regimen after d/c Elevated Lipase -Not convinced the patient has pancreatitis with minimal elevation  -clinical syndrome not consistent with pancreatitis; imaging did not show any sign of pancreatitis - Lipase 93-->49 -started clear liquids after EGD -diet advanced and pt tolerated it without vomiting Portal vein thrombosis -started heparin IV  Initially-->[place] lovenox -plan to d/c home with lovenox as per her hematologist recommendation Pyuria -add urine culture--multiple organisms, no dysuria -will not start abx -check UDS--positive opiates Anxiety -continue celexa when able to tolerate po Transaminasemia -improved -check HIV--neg -continued outpt follow up Right leg pain/weakness/heaviness -Duplex right leg--neg -MRI brain-no acute infarct; nonspecific white matter T2 and FLAIR signal -B12--323 -pt able to ambulate without difficulty Hypomagnesemia -repleted  Discharge Condition: stable  Disposition: home Follow-up Information    Call [facility]   Specialty:  General Surgery   Why:   As needed   Contact information:   [address] [place] [address] [phone]      Diet:soft diet Wt Readings from Last 3 Encounters:  03/18/15 80.74 kg (178 lb)  10/20/14 90.719 kg (200 lb)  08/28/14 90.719 kg (200 lb)    History of present illness:  35 year old female with a history of interstitial cystitis, and recent gastric sleeve surgery on 01/27/2015 at [facility]. Postoperatively, The patient had to be hospitalized twice for pancreatitis. Most recently, the patient was discharged from the hospital after workup for her gallbladder. She had a right upper quadrant ultrasound on 02/18/2015 and 03/04/15 which did not reveal any sludge or stones contrary to pt's claim of sludge. HIDA scan revealed patent ducts with EF of 74%. However, the patient claimed that she had pain with injection of CCK. The patient was also recently diagnosed with a portal vein thrombosis noted on a CT scan on 02/05/2015. She saw hematology, Dr. Foy Guadalajara, at [facility] whom recommended anticoagulation for at least 3 months. The patient was started on Lovenox 1 mg/kg  bid and states she has been compliant up to the hospitalization. She presented to the ED with worsening abdominal pain with nausea and vomiting for 2 day period.  Consultants: [facility] General surgery  Discharge Exam: Filed Vitals:   03/18/15 2137 03/19/15 0606  BP: 107/53 107/61  Pulse: 80 84  Temp: 98.5 F (36.9 C) 98.2 F (36.8 C)  Resp: 18 18   Filed Vitals:   03/18/15 1340 03/18/15 1414 03/18/15 2137 03/19/15 0606  BP: 120/49 118/70 107/53 107/61  Pulse: 81 88 80 84  Temp:  98.2 F (36.8 C) 98.5 F (36.9 C) 98.2 F (36.8 C)  TempSrc:  Oral Oral Oral  Resp: 22 18 18 18   Height:  Weight:      SpO2: 98% 98% 98% 98%   General: A&O x 3, NAD, pleasant, cooperative Cardiovascular: RRR, no rub, no gallop, no S3 Respiratory: CTAB, no wheeze, no rhonchi Abdomen:soft,  epigastric tender without rebound, nondistended, positive bowel sounds Extremities: No edema, No lymphangitis, no petechiae  Discharge Instructions  Discharge Instructions    Activity as tolerated - No restrictions    Complete by:  As directed      Activity as tolerated - No restrictions    Complete by:  As directed      Call MD for:  difficulty breathing, headache or visual disturbances    Complete by:  As directed      Call MD for:  difficulty breathing, headache or visual disturbances    Complete by:  As directed      Call MD for:  hives    Complete by:  As directed      Call MD for:  hives    Complete by:  As directed      Call MD for:  persistant dizziness or light-headedness    Complete by:  As directed      Call MD for:  persistant dizziness or light-headedness    Complete by:  As directed      Call MD for:  persistant nausea and vomiting    Complete by:  As directed      Call MD for:  persistant nausea and vomiting    Complete by:  As directed      Call MD for:  redness, tenderness, or signs of infection (pain, swelling, redness, odor or green/yellow discharge around incision site)    Complete by:  As directed      Call MD for:  redness, tenderness, or signs of infection (pain, swelling, redness, odor or green/yellow discharge around incision site)    Complete by:  As directed      Call MD for:  severe uncontrolled pain    Complete by:  As directed      Call MD for:  severe uncontrolled pain    Complete by:  As directed      Call MD for:  temperature >100.4    Complete by:  As directed      Call MD for:  temperature >100.4    Complete by:  As directed      Diet - low sodium heart healthy    Complete by:  As directed      Diet - low sodium heart healthy    Complete by:  As directed      Discharge instructions    Complete by:  As directed   Take MiraLAX 3 times a day to treat your car chronic constipation Stay well hydrated Call your surgeon in [place] that was  planning to do your cholecystectomy and reschedule that surgery  There are no restrictions on your physical activity Continue all of your usual medications     Discharge instructions    Complete by:  As directed   Stay well hydrated You state that you have no narcotic pain medication home, so we will give you a prescription for hydrocodone  Continue all of your usual medications, including the Lovenox that you report is for hepatic vein thrombosis  Make an appointment with your surgeon in [place] to reschedule the cholecystectomy that was previously set up     Driving Restrictions    Complete by:  As directed   Ok to drive  Lifting restrictions    Complete by:  As directed   No restrictions     May shower / Bathe    Complete by:  As directed      May shower / Bathe    Complete by:  As directed      May walk up steps    Complete by:  As directed      May walk up steps    Complete by:  As directed      No wound care    Complete by:  As directed      No wound care    Complete by:  As directed             Medication List    STOP taking these medications        HYDROcodone-acetaminophen 7.5-325 mg/15 ml solution  Commonly known as:  HYCET  Replaced by:  HYDROcodone-acetaminophen 5-325 MG tablet     ondansetron 4 MG tablet  Commonly known as:  ZOFRAN      TAKE these medications        ALPRAZolam 0.25 MG tablet  Commonly known as:  XANAX  Take 0.25 mg by mouth at bedtime as needed for anxiety.     citalopram 10 MG tablet  Commonly known as:  CELEXA  Take 10 mg by mouth daily.     HYDROcodone-acetaminophen 5-325 MG tablet  Commonly known as:  NORCO  Take 1-2 tablets by mouth every 6 (six) hours as needed for moderate pain or severe pain.     HYDROcodone-acetaminophen 5-325 MG tablet  Commonly known as:  NORCO  Take 1-2 tablets by mouth every 6 (six) hours as needed for moderate pain or severe pain.     LOVENOX [place]  Inject 80 mg into the skin 2 (two)  times daily.     enoxaparin 80 MG/0.8ML injection  Commonly known as:  LOVENOX  Inject 80 mg into the skin every 12 (twelve) hours.     pantoprazole 40 MG tablet  Commonly known as:  PROTONIX  Take 1 tablet (40 mg total) by mouth daily.     promethazine 25 MG suppository  Commonly known as:  PHENERGAN  Place 25 mg rectally every 6 (six) hours as needed for nausea or vomiting.         The results of significant diagnostics from this hospitalization (including imaging, microbiology, ancillary and laboratory) are listed below for reference.    Significant Diagnostic Studies: Mr Brain Wo Contrast  03/18/2015  CLINICAL DATA:  Episode of RIGHT leg weakness and numbness. Anxiety versus TIA. EXAM: MRI HEAD WITHOUT CONTRAST TECHNIQUE: Multiplanar, multiecho pulse sequences of the brain and surrounding structures were obtained without intravenous contrast. COMPARISON:  None. FINDINGS: No evidence for acute infarction, hemorrhage, mass lesion, hydrocephalus, or extra-axial fluid. Normal cerebral volume. No white matter disease. Tiny foci of subcortical white matter signal abnormality on T2 and FLAIR imaging, see for instance image 16 series 6, are nonspecific. Considerations include early small vessel disease, chronic infection, vasculitis, complicated migraine. Demyelinating disease is not favored in that there are no periventricular lesions. Flow voids are maintained throughout the carotid, basilar, and vertebral arteries. There are no areas of chronic hemorrhage. Visualized calvarium, skull base, and upper cervical osseous structures unremarkable. Scalp and extracranial soft tissues, orbits, sinuses, and mastoids show no acute process. IMPRESSION: No acute stroke or intracranial mass lesion. Minor punctate foci of subcortical white matter signal abnormality, nonspecific. See discussion above. Electronically Signed  By: Elsie Stain M.D.   On: 03/18/2015 10:13   Ct Abdomen Pelvis W  Contrast  03/15/2015  CLINICAL DATA:  35 year old female with a 5 week history of epigastric and right upper quadrant pain. 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 EXAM: CT ABDOMEN AND PELVIS WITH CONTRAST TECHNIQUE: Multidetector CT imaging of the abdomen and pelvis was performed using the standard protocol following bolus administration of intravenous contrast. CONTRAST:  25mL OMNIPAQUE IOHEXOL 300 MG/ML SOLN, OMNIPAQUE IOHEXOL 300 MG/ML SOLN COMPARISON:  Prior CT abdomen/ pelvis 02/17/2015 FINDINGS: Lower Chest: The lung bases are clear. Visualized cardiac structures are within normal limits for size. No pericardial effusion. Unremarkable visualized distal thoracic esophagus. Abdomen: Surgical changes of sleeve gastrectomy. No evidence of postoperative complication. The duodenum, spleen, adrenal glands and pancreas are all unremarkable. Normal hepatic contour and morphology. No discrete hepatic lesion. Gallbladder is unremarkable. No intra or extrahepatic biliary ductal dilatation. Unremarkable appearance of the bilateral kidneys. No focal solid lesion, hydronephrosis or nephrolithiasis. No evidence of obstruction or focal bowel wall thickening. Normal appendix in the right lower quadrant. The terminal ileum is unremarkable. No free fluid or suspicious adenopathy. Pelvis: IUD in the expected location. Otherwise, unremarkable uterus, at adnexa and bladder. No free fluid or suspicious adenopathy. Musculoskeletal: No acute fracture or aggressive appearing lytic or blastic osseous lesion. Vascular: No significant atherosclerotic vascular disease, aneurysmal dilatation or acute abnormality. Portal veins are patent. IMPRESSION: 1. No acute abnormality in the abdomen or pelvis. 2. Surgical changes of prior sleeve gastrectomy without evidence of complication. 3. IUD in place. Electronically Signed   By: Malachy Moan M.D.   On:  03/15/2015 23:28     Microbiology: Recent Results (from the past 240 hour(s))  Urine culture     Status: None   Collection Time: 03/17/15  8:44 AM  Result Value Ref Range Status   Specimen Description URINE, CLEAN CATCH  Final   Special Requests NONE  Final   Culture MULTIPLE SPECIES PRESENT, SUGGEST RECOLLECTION  Final   Report Status 03/18/2015 FINAL  Final     Labs: Basic Metabolic Panel:  Recent Labs Lab 03/15/15 2055 03/16/15 1424 03/17/15 0500 03/18/15 0745  NA 142  --  142 141  K 3.2*  --  3.9 4.1  CL 106  --  113* 111  CO2 26  --  19* 21*  GLUCOSE 92  --  74 64*  BUN 7  --  <5* <5*  CREATININE 0.74 0.71 0.63 0.76  CALCIUM 9.2  --  8.7* 9.0  MG  --   --   --  1.6*   Liver Function Tests:  Recent Labs Lab 03/15/15 2055 03/17/15 0500 03/18/15 0745  AST 67* 33 29  ALT 68* 46 43  ALKPHOS 105 77 96  BILITOT 0.7 0.7 0.7  PROT 7.5 5.2* 6.5  ALBUMIN 3.9 2.7* 3.1*    Recent Labs Lab 03/15/15 2055 03/16/15 1424 03/17/15 0500 03/18/15 0745  LIPASE 93*  --  53* 49  AMYLASE  --  64  --   --    No results for input(s): AMMONIA in the last 168 hours. CBC:  Recent Labs Lab 03/15/15 2055 03/16/15 1424 03/17/15 0500 03/18/15 0745 03/19/15 0603  WBC 4.6 3.8* 3.3* 4.9 4.6  HGB 11.9* 10.5* 9.7* 11.1* 9.8*  HCT 38.1 32.9* 31.7* 34.4* 31.8*  MCV 81.2 82.7 82.8 81.1 81.7  PLT 223 180 185 175 186   Cardiac  Enzymes:  Recent Labs Lab 03/18/15 0745  CKTOTAL 73   BNP: Invalid input(s): POCBNP CBG:  Recent Labs Lab 03/17/15 0736 03/17/15 1714  GLUCAP 68 72    Time coordinating discharge:  Greater than 30 minutes  Signed:  Navia Lindahl, DO [facility] Pager: [phone] 03/19/2015, 10:26 AM

## 2015-03-19 NOTE — Progress Notes (Signed)
Pt discharged home in stable condition 

## 2015-03-19 NOTE — Discharge Summary (Signed)
Patient ID: Laura Shah [id number] 35 y.o. 1980/02/23  Admit date: 03/15/2015  Discharge date and time: 03/19/2015  Admitting Physician: Tat,D.  Discharge Physician: Ernestene Mention  Admission Diagnoses: Acute pancreatitis with uninfected necrosis, unspecified pancreatitis type [K85.91]  Discharge Diagnoses: Acute pancreatitis, resolved                                         Abdominal pain and nausea.  Etiology unclear.  No evidence of cholecystitis or biliary dyskinesia despite multiple imaging studies.                                         Acute and chronic constipation                                         Elevated LFTs, mild and nonspecific, resolved.                                         History hepatic vein thrombosis.  Fully anticoagulated on Lovenox  Operations: Procedure(s): ESOPHAGOGASTRODUODENOSCOPY (EGD) WITH PROPOFOL  Admission Condition: fair  Discharged Condition: good  Indication for Admission: Laura Shah is a 35 y.o. female with hx of interstitial cystitis, OSA, appendectomy and episode of pancreatitis in 03/2014 when she was seen outpt by [facility]. Pt had gastric sleeve surgery done on 01/27/2015 by Dr. Kathryne Gin at [facility].  She is currently on high-dose Lovenox because of hepatic vein thrombosis.  Per patient, since surgery she has been hospitalized twice for acute pancreatitis and ileus last discharged on 1/25. She reports HIDA scan done at [facility] on 2/3, but results currently unavailable to me. She was scheduled for a cholecystectomy with Dr. Kathryne Gin on 2/16. Pt states abdominal pain and nausea became so bad she call [facility] yesterday and was told to come to the ED for further evaluation.  Pt states abdominal pain has not completely subsided since first hospitalization, but notably worse in the last 2 days with n/v and constipation. She states no BM in 10 days.  She is chronically constipated.  Denies any chest pain, dizziness, sob,  fever or chills. Work up done in ED shows lipase 93, mildly elevated LFTs, +bili in urine and mild hypokalemia. CT abdomen and pelvis unremarkable. Pt is to be admitted for further evaluation and treatment.  Hospital Course: The patient was admitted for IV hydration, control of pain and nausea and consultative care.  She was seen by gastroenterology, Dr. Maurine Minister, on February 15 their impression was prolonged postoperative pain, less likely biliary dyskinesia, and the clinical picture was not consistent with true pancreatitis since the lipase elevation was not that great.  Initially they did not plan EGD.    The patient was seen by Dr. Harriette Bouillon of surgery October 15 for consultation.  He did not feel that cholecystectomy was immediately indicated, and he felt that the diagnosis was in question, especially since imaging studies were negative and she has hepatic vein thrombosis.     Imaging studies include CT scan of abdomen and pelvis which showed no acute abnormality in the abdomen  or pelvis, surgical changes of prior sleeve gastrectomy an IUD in place.      Prior hepatobiliary scan was reviewed showing a normal study with normal gallbladder ejection fraction.  The patient reported pain with CCK infusion.  She did undergo upper endoscopy prior to discharge which was unremarkable with normal appearance of the gastric sleeve and no ulceration or inflammation.     She resumed diet but states she still had pain and constipation.  Liver function test return to normal.  Lipase returned to normal.  CBC was normal.  On the day of discharge her hemoglobin was 9.8 and WBC 4600.  Lipase 49.  Normal LFTs.    Upon discharge she requested a prescription for hydrocodone for pain stays she had not at home and that was done.  She was given instructions in diet and activities.  She was told to continue all of her usual medications including the Lovenox.  She was advised to go back to see her surgeon who is planning  cholecystectomy.  We did advise her that it was unclear to [place] for the cholecystectomy was indicated and whether it would help her at all.     She will continue follow-up with Dr. Wendall Papa at [facility] as necessary.  Surgical follow-up will be in [facility].                Consults: Gastroenterology, surgery, internal medicine.  Significant Diagnostic Studies: Lab work.  CT scan.  Endoscopy.  Treatments: IV hydration  Disposition: Home  Patient Instructions:    Medication List    STOP taking these medications        HYDROcodone-acetaminophen 7.5-325 mg/15 ml solution  Commonly known as:  HYCET  Replaced by:  HYDROcodone-acetaminophen 5-325 MG tablet     ondansetron 4 MG tablet  Commonly known as:  ZOFRAN      TAKE these medications        ALPRAZolam 0.25 MG tablet  Commonly known as:  XANAX  Take 0.25 mg by mouth at bedtime as needed for anxiety.     citalopram 10 MG tablet  Commonly known as:  CELEXA  Take 10 mg by mouth daily.     HYDROcodone-acetaminophen 5-325 MG tablet  Commonly known as:  NORCO  Take 1-2 tablets by mouth every 6 (six) hours as needed for moderate pain or severe pain.     HYDROcodone-acetaminophen 5-325 MG tablet  Commonly known as:  NORCO  Take 1-2 tablets by mouth every 6 (six) hours as needed for moderate pain or severe pain.     LOVENOX [place]  Inject 80 mg into the skin 2 (two) times daily.     enoxaparin 80 MG/0.8ML injection  Commonly known as:  LOVENOX  Inject 80 mg into the skin every 12 (twelve) hours.     pantoprazole 40 MG tablet  Commonly known as:  PROTONIX  Take 1 tablet (40 mg total) by mouth daily.     promethazine 25 MG suppository  Commonly known as:  PHENERGAN  Place 25 mg rectally every 6 (six) hours as needed for nausea or vomiting.        Activity: activity as tolerated Diet: low fat, low cholesterol diet Wound Care: none needed  Follow-up:  With your general surgeon in [place] in 1  week.  Signed: Angelia Mould. Derrell Lolling, M.D., FACS General and minimally invasive surgery Breast and Colorectal Surgery  03/19/2015, 10:01 AM

## 2015-03-19 NOTE — Progress Notes (Signed)
ANTICOAGULATION CONSULT NOTE  Pharmacy Consult:  Heparin Indication:  Portal vein thrombosis  Allergies  Allergen Reactions  . Reglan [Metoclopramide] Anxiety  . Morphine And Related Itching    Patient Measurements: Height:  (154.9 cm) Weight: 178 lb (80.74 kg) IBW/kg (Calculated) : 47.8 Heparin Dosing Weight: 66 kg  Vital Signs: Temp: 98.2 F (Laura.8 C) (02/18 0606) Temp Source: Oral (02/18 0606) BP: 107/61 mmHg (02/18 0606) Pulse Rate: 84 (02/18 0606)  Labs:  Recent Labs  03/16/15 1424 03/17/15 0500 03/17/15 1028  03/18/15 0745 03/18/15 2118 03/19/15 0603  HGB 10.5* 9.7*  --   --  11.1*  --  9.8*  HCT 32.9* 31.7*  --   --  34.4*  --  31.8*  PLT 180 185  --   --  175  --  186  LABPROT  --   --  15.9*  --   --   --   --   INR  --   --  1.25  --   --   --   --   HEPARINUNFRC  --   --   --   < > 0.59 0.20* 0.35  CREATININE 0.71 0.63  --   --  0.76  --   --   CKTOTAL  --   --   --   --  73  --   --   < > = values in this interval not displayed.  Estimated Creatinine Clearance: 93.6 mL/min (by C-G formula based on Cr of 0.76).   Assessment: Laura Shah on heparin for portal vein thrombosis, plan to d/c on lovenox when ready. Last HL therapeutic at 0.35, but rate never increased from previous low. Hgb ok at 9.8, plts wnl.  No bleeding reported.  Goal of Therapy:  Heparin level 0.3-0.7 units/ml Monitor platelets by anticoagulation protocol: Yes   Plan:  Continue heparin gtt at 1,050 units/hr Check 6 hr confirmatory HL Monitor daily HL, CBC, s/s of bleed  Enzo Bi, PharmD, [facility] Clinical Pharmacist Pager [phone] 03/19/2015 8:08 AM

## 2015-03-21 ENCOUNTER — Encounter (HOSPITAL_COMMUNITY): Payer: Self-pay | Admitting: Gastroenterology

## 2015-03-31 HISTORY — PX: LAPAROSCOPIC CHOLECYSTECTOMY: SUR755

## 2015-04-01 ENCOUNTER — Telehealth: Payer: Self-pay | Admitting: Gastroenterology

## 2015-04-01 NOTE — Telephone Encounter (Signed)
Patient calling back regarding this and is requesting to talk to a nurse today.

## 2015-04-01 NOTE — Telephone Encounter (Signed)
Patient had a cholecystectomy yesterday.  She says her surgeon told her she had a lot of stool in her colon.  She was prescribed a colon prep.  She is asking if she can take it.  She is advised that she should follow the instructions of the surgeon.  Marland Kitchen.  She will call back for any additional questions or or concerns.  She is also advised to contact her surgeon for concerns

## 2015-04-04 ENCOUNTER — Telehealth: Payer: Self-pay | Admitting: Gastroenterology

## 2015-04-04 NOTE — Telephone Encounter (Signed)
Had surgery on Thursday to have gallbladder removed and has not had a bowel movement in a month.  She saw the surgeon today and was given instructions to take miralax.  I advised her to follow the surgeons instructions and made a follow up appt with Dr Christella HartiganJacobs for 04/2015

## 2015-04-19 ENCOUNTER — Ambulatory Visit (INDEPENDENT_AMBULATORY_CARE_PROVIDER_SITE_OTHER): Payer: BLUE CROSS/BLUE SHIELD | Admitting: Gastroenterology

## 2015-04-19 ENCOUNTER — Encounter: Payer: Self-pay | Admitting: Gastroenterology

## 2015-04-19 VITALS — BP 108/70 | HR 97 | Ht 61.0 in | Wt 161.0 lb

## 2015-04-19 DIAGNOSIS — K5900 Constipation, unspecified: Secondary | ICD-10-CM | POA: Diagnosis not present

## 2015-04-19 MED ORDER — NA SULFATE-K SULFATE-MG SULF 17.5-3.13-1.6 GM/177ML PO SOLN: 1.0000 | Freq: Once | ORAL | Status: DC

## 2015-04-19 NOTE — Progress Notes (Signed)
HPI: This is a   35 year old woman whom I am meeting for the first time today  She has been suspected of having several episodes of pancreatitis (max lipase 577 at [facility] during recent hospitalization since 2014 her lipase has been 40 to 93 max). Extensive imaging both here and at [facility] (can be found in EPIC and Care Everywhere) has not shown any evidence of pancreatitis radio-graphically. This includes multiple CT scans (2015, 2016, 2017), ultrasounds, HIDA scans (2016), UGI study, etc. Was hospitalized at [facility] for pancreatitis and ileus in January 2017. Underwent gastric sleeve in 12/2014. Regardless, there is unknown source of "pnacreatitis". No ETOH use, triglycerides normal, gallbladder evaluation has been unremarkable.  I don't think she's ever truly had pancreatitis after reviewing the above information.  She underwent cholecystectomy 03/2015 in [place], same surgeon who performed gastric sleeve 12/2014.  EGD Dr. Myrtie Neitheranis 03/2015 while inpatient at [facility]: s/p (recent) gastric sleeve, otherwise normal examination.  This is the first time I am meeting her (was seen by extender in 2016 in our office, and inpatient team 2017 February).  Chief complaint is constipation, abdominal pain, back pain  Has lost 53 pounds since sleeve surgery 3 months.  She tells me she had an ileus in January.  She's been having trouble with constipation.  Can sometimes be a month without a BM.    A lot of stomach pains radiating to her back.  She even needed to take hydrocodone.  She says she rarely takes narcotics.  Takes fiber supplement (gummi) takes daily.  Takes miralax every day (for past 2 months).  Dulcolax suppositories.  Fleet enemas periodically.  Took a full colonoscopy clense.  She had yet another CT scan in [place]  in the past few weeks; we don't have those results.  She says her hematologist and her surgeon recommended she have yet another one.  We don't have those  results.     Past Medical History  Diagnosis Date  . Interstitial cystitis   . Tachycardia   . Heart valve regurgitation   . Pancreatitis   . Anxiety   . Ileus [facility](HCC)     Past Surgical History  Procedure Laterality Date  . Ovarian cyst removal    . Appendectomy    . Gastrectomy    . Esophagogastroduodenoscopy (egd) with propofol N/A 03/18/2015    Procedure: ESOPHAGOGASTRODUODENOSCOPY (EGD) WITH PROPOFOL;  Surgeon: Sherrilyn RistHenry L Danis III, MD;  Location[facility];  Service: Endoscopy;  Laterality: N/A;    Current Outpatient Prescriptions  Medication Sig Dispense Refill  . enoxaparin (LOVENOX) 80 MG/0.8ML injection Inject 80 mg into the skin every 12 (twelve) hours.    . Enoxaparin Sodium (LOVENOX Luther) Inject 80 mg into the skin 2 (two) times daily.     Marland Kitchen. HYDROcodone-acetaminophen (NORCO) 5-325 MG tablet Take 1-2 tablets by mouth every 6 (six) hours as needed for moderate pain or severe pain. 30 tablet 0  . pantoprazole (PROTONIX) 40 MG tablet Take 1 tablet (40 mg total) by mouth daily. (Patient taking differently: Take 40 mg by mouth 2 (two) times daily. ) 30 tablet 0  . promethazine (PHENERGAN) 25 MG suppository Place 25 mg rectally every 6 (six) hours as needed for nausea or vomiting.    Marland Kitchen. ALPRAZolam (XANAX) 0.25 MG tablet Take 0.25 mg by mouth at bedtime as needed for anxiety. Reported on 04/19/2015    . citalopram (CELEXA) 10 MG tablet Take 10 mg by mouth daily. Reported on 04/19/2015  No current facility-administered medications for this visit.    Allergies as of 04/19/2015 - Review Complete 04/19/2015  Allergen Reaction Noted  . Reglan [metoclopramide] Anxiety 03/15/2015  . Morphine and related Itching 08/28/2014    Family History  Problem Relation Age of Onset  . Breast cancer Maternal Grandmother   . Breast cancer Paternal Grandmother   . Diabetes Paternal Grandmother   . Diabetes Father   . Diabetes Father   . Hypertension Father     Social History   Social  History  . Marital Status: Single    Spouse Name: N/A  . Number of Children: 2  . Years of Education: Grad    Occupational History  . Not on file.   Social History Main Topics  . Smoking status: Never Smoker   . Smokeless tobacco: Never Used  . Alcohol Use: No  . Drug Use: No  . Sexual Activity: Yes    Birth Control/ Protection: IUD   Other Topics Concern  . Not on file   Social History Narrative   Drinks about 1-2 cans of soda a day      Physical Exam: BP 108/70 mmHg  Pulse 97  Ht  (1.549 m)  Wt 161 lb (73.029 kg)  BMI 30.44 kg/m2 Constitutional: generally well-appearing Psychiatric: alert and oriented x3 Abdomen: soft, nontender, nondistended, no obvious ascites, no peritoneal signs, normal bowel sounds   Assessment and plan: 35 y.o. female with Abdominal pain, constipation, back pain  First I do not think she has ever truly had acute pancreatitis. None of the blood tests we have here show significantly elevated lipase, none of the imaging which she has had here and in [facility] of her pancreas including the quality CT scans have ever shown pancreatic inflammation. I cannot explain her intermittent epigastric pains but I do not think she needs any further testing for it since she has had 8-10 CT scans in the past year or 2 between here in [facility], upper endoscopy, laparoscopic surgery in her gallbladder, gastric bariatric procedure. Currently she seems to be very bothered by constipation. She tells me she had yet another CT scan in [facility] recently and it showed a "thickened colon". I don't have any of those reports for review here. Given her constipation and possibly abnormal colon on CT I recommended we proceed with colonoscopy at her soonest convenience. She will need double prep given her recent constipation.  We will not be prescribing her narcotic pain medicines.   Rob Bunting, MD [facility] 04/19/2015, 2:00 PM

## 2015-04-19 NOTE — Patient Instructions (Signed)
You will be set up for a colonoscopy for your constipation, abnormal colon on recent. Double prep since you've had such bad constipation lately.

## 2015-05-03 ENCOUNTER — Telehealth: Payer: Self-pay | Admitting: Gastroenterology

## 2015-05-04 NOTE — Telephone Encounter (Signed)
The pt is aware that we will call her as soon as suprep samples are received.

## 2015-05-04 NOTE — Telephone Encounter (Signed)
Left message on machine to call back  

## 2015-05-10 ENCOUNTER — Telehealth: Payer: Self-pay | Admitting: Gastroenterology

## 2015-05-11 NOTE — Telephone Encounter (Signed)
The pt was instructed to keep appt as scheduled and we will call her as soon as a suprep is received in the office so that we can get her a sample

## 2015-05-16 ENCOUNTER — Telehealth: Payer: Self-pay

## 2015-05-16 ENCOUNTER — Ambulatory Visit: Payer: Self-pay | Admitting: Gastroenterology

## 2015-05-16 NOTE — Telephone Encounter (Signed)
Lm regarding prep 

## 2015-05-16 NOTE — Telephone Encounter (Signed)
Told patient I would leave a sample prep up front for her to pick up  Patient agreed

## 2015-05-23 ENCOUNTER — Encounter: Payer: Self-pay | Admitting: Gastroenterology

## 2015-05-23 ENCOUNTER — Ambulatory Visit (AMBULATORY_SURGERY_CENTER): Payer: BLUE CROSS/BLUE SHIELD | Admitting: Gastroenterology

## 2015-05-23 VITALS — BP 120/76 | HR 72 | Temp 96.9°F | Resp 17 | Ht 61.0 in | Wt 161.0 lb

## 2015-05-23 DIAGNOSIS — K5900 Constipation, unspecified: Secondary | ICD-10-CM

## 2015-05-23 DIAGNOSIS — K5909 Other constipation: Secondary | ICD-10-CM

## 2015-05-23 MED ORDER — LINACLOTIDE 145 MCG PO CAPS: 145.0000 ug | ORAL_CAPSULE | Freq: Every day | ORAL | Status: DC

## 2015-05-23 MED ORDER — SODIUM CHLORIDE 0.9 % IV SOLN: 500.0000 mL | INTRAVENOUS | Status: DC

## 2015-05-23 NOTE — Patient Instructions (Signed)
Discharge instructions given. Normal exam. Resume previous medications. YOU HAD AN ENDOSCOPIC PROCEDURE TODAY AT THE [facility]:   Refer to the procedure report that was given to you for any specific questions about what was found during the examination.  If the procedure report does not answer your questions, please call your gastroenterologist to clarify.  If you requested that your care partner not be given the details of your procedure findings, then the procedure report has been included in a sealed envelope for you to review at your convenience later.  YOU SHOULD EXPECT: Some feelings of bloating in the abdomen. Passage of more gas than usual.  Walking can help get rid of the air that was put into your GI tract during the procedure and reduce the bloating. If you had a lower endoscopy (such as a colonoscopy or flexible sigmoidoscopy) you may notice spotting of blood in your stool or on the toilet paper. If you underwent a bowel prep for your procedure, you may not have a normal bowel movement for a few days.  Please Note:  You might notice some irritation and congestion in your nose or some drainage.  This is from the oxygen used during your procedure.  There is no need for concern and it should clear up in a day or so.  SYMPTOMS TO REPORT IMMEDIATELY:   Following lower endoscopy (colonoscopy or flexible sigmoidoscopy):  Excessive amounts of blood in the stool  Significant tenderness or worsening of abdominal pains  Swelling of the abdomen that is new, acute  Fever of 100F or higher   For urgent or emergent issues, a gastroenterologist can be reached at any hour by calling [phone].   DIET: Your first meal following the procedure should be a small meal and then it is ok to progress to your normal diet. Heavy or fried foods are harder to digest and may make you feel nauseous or bloated.  Likewise, meals heavy in dairy and vegetables can increase bloating.  Drink plenty  of fluids but you should avoid alcoholic beverages for 24 hours.  ACTIVITY:  You should plan to take it easy for the rest of today and you should NOT DRIVE or use heavy machinery until tomorrow (because of the sedation medicines used during the test).    FOLLOW UP: Our staff will call the number listed on your records the next business day following your procedure to check on you and address any questions or concerns that you may have regarding the information given to you following your procedure. If we do not reach you, we will leave a message.  However, if you are feeling well and you are not experiencing any problems, there is no need to return our call.  We will assume that you have returned to your regular daily activities without incident.  If any biopsies were taken you will be contacted by phone or by letter within the next 1-3 weeks.  Please call us at [phone] if you have not heard about the biopsies in 3 weeks.    SIGNATURES/CONFIDENTIALITY: You and/or your care partner have signed paperwork which will be entered into your electronic medical record.  These signatures attest to the fact that that the information above on your After Visit Summary has been reviewed and is understood.  Full responsibility of the confidentiality of this discharge information lies with you and/or your care-partner. 

## 2015-05-23 NOTE — Op Note (Signed)
[facility] Patient Name: Laura Shah Procedure Date: 05/23/2015 2:39 PM MRN: [id number] Endoscopist: Rachael Feeaniel P Jacobs , MD Age: 35 Date of Birth: 08/28/1980 Gender: Female Procedure:                Colonoscopy Indications:              Constipation Medicines:                Monitored Anesthesia Care Procedure:                Pre-Anesthesia Assessment:                           - Prior to the procedure, a History and Physical                            was performed, and patient medications and                            allergies were reviewed. The patient's tolerance of                            previous anesthesia was also reviewed. The risks                            and benefits of the procedure and the sedation                            options and risks were discussed with the patient.                            All questions were answered, and informed consent                            was obtained. Prior Anticoagulants: The patient has                            taken no previous anticoagulant or antiplatelet                            agents. ASA Grade Assessment: II - A patient with                            mild systemic disease. After reviewing the risks                            and benefits, the patient was deemed in                            satisfactory condition to undergo the procedure.                           After obtaining informed consent, the colonoscope  was passed under direct vision. Throughout the                            procedure, the patient's blood pressure, pulse, and                            oxygen saturations were monitored continuously. The                            Model PCF-[id number]) scope was introduced                            through the anus and advanced to the the cecum,                            identified by appendiceal orifice and ileocecal                            valve.  The colonoscopy was performed without                            difficulty. The patient tolerated the procedure                            well. The quality of the bowel preparation was                            excellent. The ileocecal valve, appendiceal                            orifice, and rectum were photographed. Scope In: 2:49:49 PM Scope Out: 2:58:44 PM Scope Withdrawal Time: 0 hours 5 minutes 5 seconds  Total Procedure Duration: 0 hours 8 minutes 55 seconds  Findings:                 The entire examined colon appeared normal on direct                            and retroflexion views. Complications:            No immediate complications. Estimated blood loss:                            None. Estimated Blood Loss:     Estimated blood loss: none. Impression:               - The entire examined colon is normal on direct and                            retroflexion views.                           - No specimens collected. Recommendation:           - Patient has a contact number available for  emergencies. The signs and symptoms of potential                            delayed complications were discussed with the                            patient. Return to normal activities tomorrow.                            Written discharge instructions were provided to the                            patient.                           - Resume previous diet.                           - Continue present medications and please start                            linzess (to treat constipation). This was called                            into your pharmacy today. Call my office in 5-6                            weeks to report on your response.                           - Repeat colonoscopy at age 51 for routine                            screening. Rachael Fee, MD 05/23/2015 3:03:07 PM This report has been signed electronically.

## 2015-05-23 NOTE — Progress Notes (Signed)
Report to PACU, RN, vss, BBS= Clear.  

## 2015-05-24 ENCOUNTER — Telehealth: Payer: Self-pay | Admitting: *Deleted

## 2015-05-24 NOTE — Telephone Encounter (Signed)
  Follow up Call-  Call back number 05/23/2015  Post procedure Call Back phone  # [phone]  Permission to leave phone message Yes     Patient questions:  Do you have a fever, pain , or abdominal swelling? No. Pain Score  0 *  Have you tolerated food without any problems? Yes  Have you been able to return to your normal activities? Yes.    Do you have any questions about your discharge instructions: Diet   No. Medications  No. Follow up visit  No.  Do you have questions or concerns about your Care? No.  Actions: * If pain score is 4 or above: No action needed, pain <4.

## 2016-05-02 ENCOUNTER — Other Ambulatory Visit: Payer: Self-pay | Admitting: Family Medicine

## 2016-05-02 DIAGNOSIS — M79651 Pain in right thigh: Secondary | ICD-10-CM

## 2016-05-03 ENCOUNTER — Ambulatory Visit
Admission: RE | Admit: 2016-05-03 | Discharge: 2016-05-03 | Disposition: A | Discharge status: Home / Self Care | Payer: Managed Care, Other (non HMO) | Source: Ambulatory Visit | Attending: Family Medicine | Admitting: Family Medicine

## 2016-05-03 DIAGNOSIS — M79651 Pain in right thigh: Secondary | ICD-10-CM

## 2016-05-30 ENCOUNTER — Other Ambulatory Visit: Payer: Self-pay | Admitting: Family Medicine

## 2016-05-30 DIAGNOSIS — R2241 Localized swelling, mass and lump, right lower limb: Secondary | ICD-10-CM

## 2016-06-01 ENCOUNTER — Other Ambulatory Visit: Payer: Self-pay

## 2016-06-08 ENCOUNTER — Other Ambulatory Visit: Payer: Self-pay

## 2016-06-22 ENCOUNTER — Ambulatory Visit
Admission: RE | Admit: 2016-06-22 | Discharge: 2016-06-22 | Disposition: A | Discharge status: Home / Self Care | Payer: Managed Care, Other (non HMO) | Source: Ambulatory Visit | Attending: Family Medicine | Admitting: Family Medicine

## 2016-06-22 ENCOUNTER — Other Ambulatory Visit: Payer: Self-pay | Admitting: Family Medicine

## 2016-06-22 DIAGNOSIS — R229 Localized swelling, mass and lump, unspecified: Principal | ICD-10-CM

## 2016-06-22 DIAGNOSIS — IMO0002 Reserved for concepts with insufficient information to code with codable children: Secondary | ICD-10-CM

## 2016-09-13 ENCOUNTER — Other Ambulatory Visit (HOSPITAL_COMMUNITY)
Admission: RE | Admit: 2016-09-13 | Discharge: 2016-09-13 | Disposition: A | Discharge status: Home / Self Care | Payer: Managed Care, Other (non HMO) | Source: Ambulatory Visit | Attending: Family Medicine | Admitting: Family Medicine

## 2016-09-13 ENCOUNTER — Other Ambulatory Visit: Payer: Self-pay | Admitting: Family Medicine

## 2016-09-13 DIAGNOSIS — Z124 Encounter for screening for malignant neoplasm of cervix: Secondary | ICD-10-CM | POA: Diagnosis not present

## 2016-09-17 LAB — CYTOLOGY - PAP
Chlamydia: NEGATIVE
Diagnosis: NEGATIVE
HPV: NOT DETECTED
Neisseria Gonorrhea: NEGATIVE

## 2016-10-23 ENCOUNTER — Encounter (HOSPITAL_BASED_OUTPATIENT_CLINIC_OR_DEPARTMENT_OTHER): Payer: Self-pay | Admitting: *Deleted

## 2016-10-23 ENCOUNTER — Emergency Department (HOSPITAL_BASED_OUTPATIENT_CLINIC_OR_DEPARTMENT_OTHER)
Admission: EM | Admit: 2016-10-23 | Discharge: 2016-10-23 | Disposition: A | Discharge status: Home / Self Care | Payer: Managed Care, Other (non HMO) | Attending: Emergency Medicine | Admitting: Emergency Medicine

## 2016-10-23 DIAGNOSIS — N898 Other specified noninflammatory disorders of vagina: Secondary | ICD-10-CM | POA: Insufficient documentation

## 2016-10-23 DIAGNOSIS — L0231 Cutaneous abscess of buttock: Secondary | ICD-10-CM | POA: Diagnosis not present

## 2016-10-23 DIAGNOSIS — Z79899 Other long term (current) drug therapy: Secondary | ICD-10-CM | POA: Diagnosis not present

## 2016-10-23 DIAGNOSIS — R300 Dysuria: Secondary | ICD-10-CM | POA: Insufficient documentation

## 2016-10-23 LAB — WET PREP, GENITAL
Clue Cells Wet Prep HPF POC: NONE SEEN
Sperm: NONE SEEN
Trich, Wet Prep: NONE SEEN
Yeast Wet Prep HPF POC: NONE SEEN

## 2016-10-23 LAB — URINALYSIS, ROUTINE W REFLEX MICROSCOPIC
Bilirubin Urine: NEGATIVE
Glucose, UA: NEGATIVE mg/dL
Ketones, ur: NEGATIVE mg/dL
Leukocytes, UA: NEGATIVE
Nitrite: NEGATIVE
Protein, ur: NEGATIVE mg/dL
Specific Gravity, Urine: >1.03 — ABNORMAL HIGH (ref 1.005–1.030)
pH: 6 (ref 5.0–8.0)

## 2016-10-23 LAB — URINALYSIS, MICROSCOPIC (REFLEX): WBC, UA: NONE SEEN WBC/hpf (ref 0–5)

## 2016-10-23 LAB — PREGNANCY, URINE: Preg Test, Ur: NEGATIVE

## 2016-10-23 MED ORDER — HYDROCODONE-ACETAMINOPHEN 5-325 MG PO TABS: 1.0000 | ORAL_TABLET | Freq: Four times a day (QID) | ORAL | 0 refills | Status: DC | PRN

## 2016-10-23 MED ORDER — LIDOCAINE HCL (PF) 1 % IJ SOLN: 10.0000 mL | Freq: Once | INTRAMUSCULAR | Status: DC

## 2016-10-23 MED ORDER — LIDOCAINE HCL 1 % IJ SOLN
INTRAMUSCULAR | Status: AC
  Administered 2016-10-23: 10 mL
  Filled 2016-10-23: qty 10

## 2016-10-23 MED ORDER — CEPHALEXIN 250 MG PO CAPS
500.0000 mg | ORAL_CAPSULE | Freq: Once | ORAL | Status: AC
  Administered 2016-10-23: 500 mg via ORAL
  Filled 2016-10-23: qty 2

## 2016-10-23 MED ORDER — CEPHALEXIN 500 MG PO CAPS: 500.0000 mg | ORAL_CAPSULE | Freq: Four times a day (QID) | ORAL | 0 refills | Status: DC

## 2016-10-23 MED ORDER — AZITHROMYCIN 250 MG PO TABS
1000.0000 mg | ORAL_TABLET | Freq: Once | ORAL | Status: AC
  Administered 2016-10-23: 1000 mg via ORAL
  Filled 2016-10-23: qty 4

## 2016-10-23 MED ORDER — CEFTRIAXONE SODIUM 250 MG IJ SOLR
250.0000 mg | Freq: Once | INTRAMUSCULAR | Status: AC
  Administered 2016-10-23: 250 mg via INTRAMUSCULAR
  Filled 2016-10-23: qty 250

## 2016-10-23 MED ORDER — IBUPROFEN 400 MG PO TABS
600.0000 mg | ORAL_TABLET | Freq: Once | ORAL | Status: AC
  Administered 2016-10-23: 600 mg via ORAL
  Filled 2016-10-23: qty 1

## 2016-10-23 NOTE — ED Provider Notes (Signed)
[facility] Provider Note   CSN: [id number] Arrival date & time: 10/23/16  1858     History   Chief Complaint Chief Complaint  Patient presents with  . Abscess  . Dysuria    HPI Laura Shah is a 36 y.o. female who presents with 2 complaints today. The first complaint is 4-5 days of progressively worsening redness, swelling, pain to the right buttock. She reports that she has a history of abscesses reports that current symptoms feel similar. She reports that she tried to express some drainage but cannot anything out of it. Patient reports that she has not taken any medications for the symptoms. Patient reports that the symptoms are worsened by sitting on the area.   Patient's second complaint is one week of progressively worsening vaginal irritation, itching, discharge. Patient states that she has noticed some white vaginal discharge. Patient reports she currently is sexually active with one partner, though it is a new partner. She reports that they have had sexual intercourse with the use of condoms but does report that in the last 2 weeks that they had sexual intercourse without any protection. Patient reports some mild lower abdominal pain. She denies any painful intercourse. Patient denies any fevers, chills, chest pain, difficulty breathing, nausea/vomiting, dysuria, hematuria.  The history is provided by the patient.    Past Medical History:  Diagnosis Date  . Anemia   . Anxiety   . GERD (gastroesophageal reflux disease)   . Heart valve regurgitation   . 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 Ileus (HCC)   . Interstitial cystitis   . Pancreatitis   . Tachycardia     Patient Active Problem List   Diagnosis Date Noted  . Right leg weakness   . 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 Constipation by delayed colonic transit   . Acute pancreatitis 03/16/2015  . OSA (obstructive sleep apnea) 03/16/2015  . Anemia 03/16/2015  . Hypokalemia 03/16/2015  . 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 Epigastric abdominal pain   . RUQ pain   . Elevated LFTs   . Irritable bowel syndrome with constipation   . Injury of hand, right 08/03/2014  . Interstitial cystitis   . Tachycardia     Past Surgical History:  Procedure Laterality Date  . APPENDECTOMY    . CESAREAN SECTION    . CHOLECYSTECTOMY    . ESOPHAGOGASTRODUODENOSCOPY (EGD) WITH PROPOFOL N/A 03/18/2015   Procedure: ESOPHAGOGASTRODUODENOSCOPY (EGD) WITH PROPOFOL;  Surgeon: Sherrilyn Rist, MD;  Location: [facility] ENDOSCOPY;  Service: Endoscopy;  Laterality: N/A;  . GASTRECTOMY    . OVARIAN CYST REMOVAL      OB History    No data available       Home Medications    Prior to Admission medications   Medication Sig Start Date End Date Taking? Authorizing Provider  clonazePAM (KLONOPIN) 0.5 MG tablet Take 0.5 mg by mouth 2 (two) times daily as needed for anxiety.   Yes [provider]  ALPRAZolam (XANAX) 0.25 MG tablet Take 0.25 mg by mouth at bedtime as needed for anxiety. Reported on 04/19/2015    [provider]  cephALEXin (KEFLEX) 500 MG capsule Take 1 capsule (500 mg total) by mouth 4 (four) times daily. 10/23/16   Maxwell Caul, PA-C  citalopram (CELEXA) 10 MG tablet Take 10 mg by mouth daily. Reported on 04/19/2015    [provider]  HYDROcodone-acetaminophen (NORCO/VICODIN) 5-325 MG tablet Take 1-2 tablets  by mouth every 6 (six) hours as needed. 10/23/16   Maxwell Caul, PA-C  linaclotide (LINZESS) 145 MCG CAPS capsule Take 1 capsule (145 mcg total) by mouth daily before breakfast. 05/23/15   Rachael Fee, MD  pantoprazole (PROTONIX) 40 MG tablet Take 1 tablet (40 mg total) by mouth daily. Patient taking differently: Take 40 mg by mouth 2 (two) times daily.  08/28/14   Dione Booze, MD  promethazine (PHENERGAN) 25 MG suppository Place 25 mg rectally every 6 (six) hours as needed for nausea or vomiting.    [provider]    Family  History Family History  Problem Relation Age of Onset  . Breast cancer Maternal Grandmother   . Breast cancer Paternal Grandmother   . Diabetes Paternal Grandmother   . Diabetes Father   . Hypertension Father     Social History Social History  Substance Use Topics  . Smoking status: Never Smoker  . Smokeless tobacco: Never Used  . Alcohol use No     Allergies   Reglan [metoclopramide] and Morphine and related   Review of Systems Review of Systems  Constitutional: Negative for fever.  Respiratory: Negative for shortness of breath.   Cardiovascular: Negative for chest pain.  Gastrointestinal: Positive for abdominal pain. Negative for diarrhea.  Genitourinary: Positive for dysuria and vaginal discharge.  Skin: Positive for color change and wound.  Neurological: Negative for weakness and numbness.     Physical Exam Updated Vital Signs BP 119/84 (BP Location: Left Arm)   Pulse 81   Temp 98.9 F (37.2 C) (Oral)   Resp 16   Ht  (1.575 m)   Wt 60.8 kg (134 lb)   SpO2 100%   BMI 24.51 kg/m   Physical Exam  Constitutional: She is oriented to person, place, and time. She appears well-developed and well-nourished.  HENT:  Head: Normocephalic and atraumatic.  Mouth/Throat: Oropharynx is clear and moist and mucous membranes are normal.  Eyes: Pupils are equal, round, and reactive to light. Conjunctivae, EOM and lids are normal.  Neck: Full passive range of motion without pain.  Cardiovascular: Normal rate, regular rhythm, normal heart sounds and normal pulses.  Exam reveals no gallop and no friction rub.   No murmur heard. Pulmonary/Chest: Effort normal and breath sounds normal.  Abdominal: Soft. Normal appearance. There is tenderness in the suprapubic area. There is no rigidity, no guarding and no CVA tenderness.  Mild suprapubic abdominal tenderness. No rigidity, no guarding.  Genitourinary:  Genitourinary Comments: The exam was performed with a chaperone  present. Normal external female genitalia. No lesions, rash, or sores. Small amount of mucoid discharge noted in the cervix. Cervix is slightly erythematous but no posterior lesions. No Friability. IUD string in place. No CMT. No adnexal mass or tenderness bilaterally. Normal uterus.  Musculoskeletal: Normal range of motion.  Neurological: She is alert and oriented to person, place, and time.  Skin: Skin is warm and dry. Capillary refill takes less than 2 seconds.  4 cm Area of warmth, erythema, induration noted to the right buttocks with central scabbed over area of central fluctuance. It does not extend to the gluteal cleft.   Psychiatric: She has a normal mood and affect. Her speech is normal.  Nursing note and vitals reviewed.    ED Treatments / Results  Labs (all labs ordered are listed, but only abnormal results are displayed) Labs Reviewed  WET PREP, GENITAL - Abnormal; Notable for the following:  Result Value   WBC, Wet Prep HPF POC MANY (*)    All other components within normal limits  URINALYSIS, ROUTINE W REFLEX MICROSCOPIC - Abnormal; Notable for the following:    APPearance CLOUDY (*)    Specific Gravity, Urine >1.030 (*)    Hgb urine dipstick TRACE (*)    All other components within normal limits  URINALYSIS, MICROSCOPIC (REFLEX) - Abnormal; Notable for the following:    Bacteria, UA FEW (*)    Squamous Epithelial / LPF 6-30 (*)    All other components within normal limits  PREGNANCY, URINE  GC/CHLAMYDIA PROBE AMP ([place]) NOT AT [facility]    EKG  EKG Interpretation None       Radiology No results found.  Procedures .Marland KitchenIncision and Drainage Date/Time: 10/23/2016 10:13 PM Performed by: Graciella Freer A Authorized by: Graciella Freer A   Consent:    Consent obtained:  Verbal   Consent given by:  Patient   Risks discussed:  Bleeding, incomplete drainage, pain and infection   Alternatives discussed:  No treatment Location:    Type:  Abscess   Size:   2   Location:  Anogenital   Anogenital location: Right buttock. Pre-procedure details:    Skin preparation:  Betadine Anesthesia (see MAR for exact dosages):    Anesthesia method:  Local infiltration   Local anesthetic:  Lidocaine 1% w/o epi Procedure type:    Complexity:  Simple Procedure details:    Incision types:  Stab incision and single straight   Scalpel blade:  11   Wound management:  Probed and deloculated   Drainage:  Purulent and bloody   Drainage amount:  Moderate   Wound treatment:  Drain placed   Packing materials:  1/4 in iodoform gauze Post-procedure details:    Patient tolerance of procedure:  Tolerated well, no immediate complications   (including critical care time)  Medications Ordered in ED Medications  lidocaine (PF) (XYLOCAINE) 1 % injection 10 mL (10 mLs Intradermal Not Given 10/23/16 2002)  lidocaine (XYLOCAINE) 1 % (with pres) injection (10 mLs  Given by Other 10/23/16 1958)  ibuprofen (ADVIL,MOTRIN) tablet 600 mg (600 mg Oral Given 10/23/16 2001)  cefTRIAXone (ROCEPHIN) injection 250 mg (250 mg Intramuscular Given 10/23/16 2148)  azithromycin (ZITHROMAX) tablet 1,000 mg (1,000 mg Oral Given 10/23/16 2148)  cephALEXin (KEFLEX) capsule 500 mg (500 mg Oral Given 10/23/16 2208)     Initial Impression / Assessment and Plan / ED Course  I have reviewed the triage vital signs and the nursing notes.  Pertinent labs & imaging results that were available during my care of the patient were reviewed by me and considered in my medical decision making (see chart for details).     36 year old female who presents with 1 week of vaginal irritation, discharge and 4-5 days of progressively worsening redness, pain, swelling to the right buttock. No fevers, chills. Mild suprapubic abdominal pain. No dysuria, hematuria, vaginal bleeding. Patient is afebrile, non-toxic appearing, sitting comfortably on examination table. Vital signs reviewed and stable. Consider UTI versus  pregnancy versus STD versus bacterial vaginosis. She/physical exam are not concerning for ovarian torsion, appendicitis, diverticulitis. Also concern for abscess to the right buttock given physical exam. Plan for pelvic and I&D of abscess.  Bedside ultrasound done in the room. Evidence of abscess with surrounding cellulitis.  I&D as documented above. He was moderate amount of purulent drainage. The wound was thoroughly explored and irrigated. Given surrounding sialitis, we'll plan to place her on antibiotic. He  is instructed follow-up in the ED in 2 days for wound recheck in packing removal.  Pelvic exam as documented above. Pelvic exam not concerning for PID. There is a small amount of mucoid discharge, and erythema noted to the cervix. I discussed at length with patient regarding getting treatment for STDs now or waiting for the results to come back. Patient wishes to go ahead and give a treatment today.   Instructed patient follow-up with her primary care doctor as needed. Structures return emergency for in 2 days for wound recheck. Given care instructions provided to patient. Strict return precautions discussed. Patient expresses understanding and agreement to plan.    Final Clinical Impressions(s) / ED Diagnoses   Final diagnoses:  Abscess of buttock, right  Vaginal discharge    New Prescriptions Discharge Medication List as of 10/23/2016 10:02 PM    START taking these medications   Details  cephALEXin (KEFLEX) 500 MG capsule Take 1 capsule (500 mg total) by mouth 4 (four) times daily., Starting Tue 10/23/2016, Print         Maxwell Caul, PA-C 10/23/16 2216    Jacalyn Lefevre, MD 10/23/16 2326

## 2016-10-23 NOTE — ED Notes (Signed)
ED Provider at bedside. Will get vitals when provider is done with procedure.

## 2016-10-23 NOTE — Discharge Instructions (Addendum)
You have been treated today for an STD.   The test results with take 2-3 days to return. If there is an abnormal result, you will be notified. If you do not hear anything, that means the results were negative. You can also log on MyChart to see the results.   Your sexual partner needs to be treated too. Do not have sexual intercourse for the next 7 days and after your partner has been treated.   Follow-up with your primary care doctor in 2-4 days. If you do not have a primary care doctor, you can use one listed in the paperwork.   Return to the Emergency Department for any fever, abdominal pain, difficulty breathing, nausea/vomiting or any other worsening or concerning symptoms.    Apply warm compresses to the area or soak the area in warm water to help continue express drainage.   Keep the wound clean and dry. Gently wash the wound with soap and water and make sure to pat it dry.   Take antibiotic and complete the entire course.   You can take Tylenol or Ibuprofen as directed for pain. You can alternate Tylenol and Ibuprofen every 4 hours. If you take Tylenol at 1pm, then you can take Ibuprofen at 5pm. Then you can take Tylenol again at 9pm.  You can take the pain medication for severe or breakthrough pain. Do not take it at the same time as the tylenol.   Return the emergency Department in 2 days to have the packing removed and for a wound recheck.   Return to the Emergency Department if you experienced any worsening/spreading redness or swelling, fever, worsening pain, or any other worsening or concerning symptoms.

## 2016-10-24 LAB — GC/CHLAMYDIA PROBE AMP (~~LOC~~) NOT AT ARMC
Chlamydia: NEGATIVE
Neisseria Gonorrhea: NEGATIVE

## 2016-10-26 ENCOUNTER — Emergency Department (HOSPITAL_BASED_OUTPATIENT_CLINIC_OR_DEPARTMENT_OTHER)
Admission: EM | Admit: 2016-10-26 | Discharge: 2016-10-26 | Disposition: A | Discharge status: Home / Self Care | Payer: Managed Care, Other (non HMO) | Attending: Emergency Medicine | Admitting: Emergency Medicine

## 2016-10-26 ENCOUNTER — Encounter (HOSPITAL_BASED_OUTPATIENT_CLINIC_OR_DEPARTMENT_OTHER): Payer: Self-pay | Admitting: Emergency Medicine

## 2016-10-26 DIAGNOSIS — Z79899 Other long term (current) drug therapy: Secondary | ICD-10-CM | POA: Insufficient documentation

## 2016-10-26 DIAGNOSIS — L0291 Cutaneous abscess, unspecified: Secondary | ICD-10-CM | POA: Diagnosis not present

## 2016-10-26 DIAGNOSIS — B379 Candidiasis, unspecified: Secondary | ICD-10-CM | POA: Insufficient documentation

## 2016-10-26 DIAGNOSIS — N898 Other specified noninflammatory disorders of vagina: Secondary | ICD-10-CM | POA: Insufficient documentation

## 2016-10-26 LAB — URINALYSIS, ROUTINE W REFLEX MICROSCOPIC
Bilirubin Urine: NEGATIVE
Glucose, UA: NEGATIVE mg/dL
Hgb urine dipstick: NEGATIVE
Ketones, ur: NEGATIVE mg/dL
Nitrite: NEGATIVE
Protein, ur: NEGATIVE mg/dL
Specific Gravity, Urine: 1.02 (ref 1.005–1.030)
pH: 6 (ref 5.0–8.0)

## 2016-10-26 LAB — URINALYSIS, MICROSCOPIC (REFLEX)

## 2016-10-26 LAB — PREGNANCY, URINE: Preg Test, Ur: NEGATIVE

## 2016-10-26 MED ORDER — LIDOCAINE HCL (PF) 1 % IJ SOLN
10.0000 mL | Freq: Once | INTRAMUSCULAR | Status: AC
  Administered 2016-10-26: 10 mL via INTRADERMAL

## 2016-10-26 MED ORDER — DOXYCYCLINE HYCLATE 100 MG PO TABS
100.0000 mg | ORAL_TABLET | Freq: Once | ORAL | Status: AC
  Administered 2016-10-26: 100 mg via ORAL
  Filled 2016-10-26: qty 1

## 2016-10-26 MED ORDER — FLUCONAZOLE 150 MG PO TABS: 150.0000 mg | ORAL_TABLET | Freq: Once | ORAL | 0 refills | Status: DC

## 2016-10-26 MED ORDER — FLUCONAZOLE 100 MG PO TABS
100.0000 mg | ORAL_TABLET | Freq: Once | ORAL | Status: AC
  Administered 2016-10-26: 100 mg via ORAL
  Filled 2016-10-26: qty 1

## 2016-10-26 MED ORDER — LIDOCAINE HCL 1 % IJ SOLN
INTRAMUSCULAR | Status: AC
  Filled 2016-10-26: qty 20

## 2016-10-26 MED ORDER — LIDOCAINE HCL 1 % IJ SOLN
INTRAMUSCULAR | Status: AC
  Filled 2016-10-26: qty 10

## 2016-10-26 MED ORDER — FLUCONAZOLE 150 MG PO TABS: 150.0000 mg | ORAL_TABLET | Freq: Once | ORAL | 0 refills | Status: AC

## 2016-10-26 MED ORDER — IBUPROFEN 800 MG PO TABS
800.0000 mg | ORAL_TABLET | Freq: Once | ORAL | Status: AC
  Administered 2016-10-26: 800 mg via ORAL
  Filled 2016-10-26: qty 1

## 2016-10-26 MED ORDER — DOXYCYCLINE HYCLATE 100 MG PO TABS: 100.0000 mg | ORAL_TABLET | Freq: Two times a day (BID) | ORAL | 0 refills | Status: DC

## 2016-10-26 NOTE — ED Triage Notes (Signed)
Pt reports she had an abscess drained here earlier this week and would like it rechecked due to a foul odor. Pt also c/o low abd pain, vaginal itching. States she was given abx but has not filled them yet.

## 2016-10-26 NOTE — ED Notes (Signed)
ED Provider at bedside. 

## 2016-10-26 NOTE — Discharge Instructions (Signed)
You have a yeast infection. If you do not have any improvement of yeast infection in 48 hours you can take your the second Diflucan that I prescribed you. I want you to start doxycycline for the abscess. Please take this for 7 days. Please follow-up with your primary care physician on Monday to have abscess reevaluated.

## 2016-10-26 NOTE — ED Provider Notes (Signed)
[facility]   CSN: [id number] Arrival date & time: 10/26/16  1808     History   Chief Complaint Chief Complaint  Patient presents with  . Multiple complaints    HPI Laura Shah is a 36 y.o. female.  HPI   Patient presenting for reevaluation of abscess that was seen on 9/25. Patient was told to come back in for reevaluation of abscess in about 2 days. Patient denies any fevers, nausea or vomiting. Patient indicates that there is still some drainage from the abscess. Patient received antibiotics, for this abscess but has not had it filled. Patient was also seen for vaginal irritation 2 days ago with a workup that included wet prep, and GC. The patient still having vaginal irritation and itching as well as some white discharge. She has not been sexually active since her last evaluation.   Past Medical History:  Diagnosis Date  . Anemia   . Anxiety   . GERD (gastroesophageal reflux disease)   . Heart valve regurgitation   . 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 Ileus (HCC)   . Interstitial cystitis   . Pancreatitis   . Tachycardia     Patient Active Problem List   Diagnosis Date Noted  . Right leg weakness   . 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 Constipation by delayed colonic transit   . Acute pancreatitis 03/16/2015  . OSA (obstructive sleep apnea) 03/16/2015  . Anemia 03/16/2015  . Hypokalemia 03/16/2015  . 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 Epigastric abdominal pain   . RUQ pain   . Elevated LFTs   . Irritable bowel syndrome with constipation   . Injury of hand, right 08/03/2014  . Interstitial cystitis   . Tachycardia     Past Surgical History:  Procedure Laterality Date  . APPENDECTOMY    . CESAREAN SECTION    . CHOLECYSTECTOMY    . ESOPHAGOGASTRODUODENOSCOPY (EGD) WITH PROPOFOL N/A 03/18/2015   Procedure: ESOPHAGOGASTRODUODENOSCOPY (EGD) WITH PROPOFOL;  Surgeon: Sherrilyn Rist, MD;  Location[facility] ENDOSCOPY;   Service: Endoscopy;  Laterality: N/A;  . GASTRECTOMY    . OVARIAN CYST REMOVAL      OB History    No data available       Home Medications    Prior to Admission medications   Medication Sig Start Date End Date Taking? Authorizing Provider  ALPRAZolam Prudy Feeler) 0.25 MG tablet Take 0.25 mg by mouth at bedtime as needed for anxiety. Reported on 04/19/2015    [provider]  cephALEXin (KEFLEX) 500 MG capsule Take 1 capsule (500 mg total) by mouth 4 (four) times daily. 10/23/16   Maxwell Caul, PA-C  citalopram (CELEXA) 10 MG tablet Take 10 mg by mouth daily. Reported on 04/19/2015    [provider]  clonazePAM (KLONOPIN) 0.5 MG tablet Take 0.5 mg by mouth 2 (two) times daily as needed for anxiety.    [provider]  doxycycline (VIBRA-TABS) 100 MG tablet Take 1 tablet (100 mg total) by mouth 2 (two) times daily. 10/26/16   Indalecio Malmstrom, Antionette Poles, MD  HYDROcodone-acetaminophen (NORCO/VICODIN) 5-325 MG tablet Take 1-2 tablets by mouth every 6 (six) hours as needed. 10/23/16   Maxwell Caul, PA-C  linaclotide (LINZESS) 145 MCG CAPS capsule Take 1 capsule (145 mcg total) by mouth daily before breakfast. 05/23/15   Rachael Fee, MD  pantoprazole (PROTONIX) 40 MG tablet Take 1 tablet (40 mg total) by mouth  daily. Patient taking differently: Take 40 mg by mouth 2 (two) times daily.  08/28/14   Dione Booze, MD  promethazine (PHENERGAN) 25 MG suppository Place 25 mg rectally every 6 (six) hours as needed for nausea or vomiting.    [provider]    Family History Family History  Problem Relation Age of Onset  . Breast cancer Maternal Grandmother   . Breast cancer Paternal Grandmother   . Diabetes Paternal Grandmother   . Diabetes Father   . Hypertension Father     Social History Social History  Substance Use Topics  . Smoking status: Never Smoker  . Smokeless tobacco: Never Used  . Alcohol use No     Allergies   Reglan [metoclopramide] and  Morphine and related   Review of Systems Review of Systems  Constitutional: Negative for chills and fever.  Gastrointestinal: Negative for nausea and vomiting.  Genitourinary: Positive for vaginal discharge. Negative for dysuria and urgency.     Physical Exam Updated Vital Signs BP 110/68 (BP Location: Right Arm)   Pulse 77   Temp 98.5 F (36.9 C) (Oral)   Resp 15   Ht  (1.575 m)   Wt 59 kg (130 lb)   SpO2 100%   BMI 23.78 kg/m   Physical Exam  Constitutional: She appears well-developed and well-nourished. No distress.  HENT:  Head: Normocephalic and atraumatic.  Eyes: Conjunctivae are normal.  Neck: Neck supple.  Cardiovascular: Normal rate and regular rhythm.   No murmur heard. Pulmonary/Chest: Effort normal and breath sounds normal. No respiratory distress.  Abdominal: Soft. There is no tenderness.  Musculoskeletal: She exhibits no edema.  Neurological: She is alert.  Skin: Skin is warm and dry.  Right buttocks wwith half a centimeter incision with packing in place. 1.5 cm surrounding induration  Psychiatric: She has a normal mood and affect.  Nursing note and vitals reviewed.    ED Treatments / Results  Labs (all labs ordered are listed, but only abnormal results are displayed) Labs Reviewed  URINALYSIS, ROUTINE W REFLEX MICROSCOPIC - Abnormal; Notable for the following:       Result Value   Leukocytes, UA SMALL (*)    All other components within normal limits  URINALYSIS, MICROSCOPIC (REFLEX) - Abnormal; Notable for the following:    Bacteria, UA MANY (*)    Squamous Epithelial / LPF 0-5 (*)    All other components within normal limits  PREGNANCY, URINE    EKG  EKG Interpretation None       Radiology No results found.  Procedures Procedures (including critical care time)  Medications Ordered in ED Medications  lidocaine (XYLOCAINE) 1 % (with pres) injection (not administered)  lidocaine (PF) (XYLOCAINE) 1 % injection 10 mL (10 mLs  Intradermal Given 10/26/16 2322)  fluconazole (DIFLUCAN) tablet 100 mg (100 mg Oral Given 10/26/16 2308)  ibuprofen (ADVIL,MOTRIN) tablet 800 mg (800 mg Oral Given 10/26/16 2321)  doxycycline (VIBRA-TABS) tablet 100 mg (100 mg Oral Given 10/26/16 2346)     Initial Impression / Assessment and Plan / ED Course  I have reviewed the triage vital signs and the nursing notes.  Pertinent labs & imaging results that were available during my care of the patient were reviewed by me and considered in my medical decision making (see chart for details).   Patient presenting for follow-up of right buttock abscess previously drained on 9/25. Abscess with some surrounding induration still present, repacked abscess, used not lidocaine to numb area. Prescribed patient doxycycline.  Patient to follow-up with PCP in 3 days to reevaluate abscess.  Patient noted to have vaginal irritation, itching and discharge UA positive for budding yeast. Patient on 9/25 with negative wet prep and G/C. no recent sexual activity. Patient likely with a yeast infection. Provided Diflucan to patient. Advised patient to repeat Diflucan in 48 hours if no improvement.  Final Clinical Impressions(s) / ED Diagnoses   Final diagnoses:  Abscess  Yeast infection    New Prescriptions Discharge Medication List as of 10/26/2016 11:40 PM    START taking these medications   Details  doxycycline (VIBRA-TABS) 100 MG tablet Take 1 tablet (100 mg total) by mouth 2 (two) times daily., Starting Fri 10/26/2016, Print    fluconazole (DIFLUCAN) 150 MG tablet Take 1 tablet (150 mg total) by mouth once., Starting Fri 10/26/2016, Normal         Berton Bon, MD 10/27/16 0023    Clarene Duke, Ambrose Finland, MD 10/27/16 [phone]

## 2017-04-17 DIAGNOSIS — M5126 Other intervertebral disc displacement, lumbar region: Secondary | ICD-10-CM

## 2017-04-17 HISTORY — DX: Other intervertebral disc displacement, lumbar region: M51.26

## 2017-06-18 ENCOUNTER — Emergency Department (HOSPITAL_COMMUNITY)
Admission: EM | Admit: 2017-06-18 | Discharge: 2017-06-18 | Disposition: A | Discharge status: Home / Self Care | Payer: BC Managed Care – PPO | Attending: Physician Assistant | Admitting: Physician Assistant

## 2017-06-18 ENCOUNTER — Encounter (HOSPITAL_COMMUNITY): Payer: Self-pay | Admitting: *Deleted

## 2017-06-18 ENCOUNTER — Other Ambulatory Visit: Payer: Self-pay

## 2017-06-18 ENCOUNTER — Emergency Department (HOSPITAL_COMMUNITY): Payer: BC Managed Care – PPO

## 2017-06-18 DIAGNOSIS — R202 Paresthesia of skin: Secondary | ICD-10-CM | POA: Diagnosis not present

## 2017-06-18 DIAGNOSIS — Z79899 Other long term (current) drug therapy: Secondary | ICD-10-CM | POA: Insufficient documentation

## 2017-06-18 DIAGNOSIS — M5126 Other intervertebral disc displacement, lumbar region: Secondary | ICD-10-CM | POA: Diagnosis not present

## 2017-06-18 DIAGNOSIS — M545 Low back pain: Secondary | ICD-10-CM | POA: Diagnosis present

## 2017-06-18 DIAGNOSIS — M79604 Pain in right leg: Secondary | ICD-10-CM | POA: Diagnosis not present

## 2017-06-18 MED ORDER — HYDROCODONE-ACETAMINOPHEN 5-325 MG PO TABS: 1.0000 | ORAL_TABLET | ORAL | 0 refills | Status: DC | PRN

## 2017-06-18 MED ORDER — DEXAMETHASONE SODIUM PHOSPHATE 10 MG/ML IJ SOLN
10.0000 mg | Freq: Once | INTRAMUSCULAR | Status: AC
  Administered 2017-06-18: 10 mg via INTRAVENOUS
  Filled 2017-06-18: qty 1

## 2017-06-18 MED ORDER — CYCLOBENZAPRINE HCL 5 MG PO TABS: 5.0000 mg | ORAL_TABLET | Freq: Three times a day (TID) | ORAL | 0 refills | Status: DC | PRN

## 2017-06-18 MED ORDER — PREDNISONE 20 MG PO TABS
60.0000 mg | ORAL_TABLET | Freq: Once | ORAL | Status: AC
  Administered 2017-06-18: 60 mg via ORAL
  Filled 2017-06-18: qty 3

## 2017-06-18 MED ORDER — HYDROMORPHONE HCL 2 MG/ML IJ SOLN
0.5000 mg | Freq: Once | INTRAMUSCULAR | Status: AC
  Administered 2017-06-18: 0.5 mg via INTRAMUSCULAR
  Filled 2017-06-18: qty 1

## 2017-06-18 MED ORDER — PREDNISONE 10 MG (21) PO TBPK: ORAL_TABLET | Freq: Every day | ORAL | 0 refills | Status: DC

## 2017-06-18 MED ORDER — OXYCODONE-ACETAMINOPHEN 5-325 MG PO TABS
1.0000 | ORAL_TABLET | Freq: Once | ORAL | Status: AC
  Administered 2017-06-18: 1 via ORAL
  Filled 2017-06-18: qty 1

## 2017-06-18 MED ORDER — LIDOCAINE 5 % EX PTCH: 1.0000 | MEDICATED_PATCH | CUTANEOUS | 0 refills | Status: DC

## 2017-06-18 MED ORDER — LIDOCAINE 5 % EX PTCH
1.0000 | MEDICATED_PATCH | CUTANEOUS | Status: DC
  Administered 2017-06-18: 1 via TRANSDERMAL
  Filled 2017-06-18: qty 1

## 2017-06-18 MED ORDER — CYCLOBENZAPRINE HCL 10 MG PO TABS: 5.0000 mg | ORAL_TABLET | Freq: Once | ORAL | Status: DC

## 2017-06-18 NOTE — ED Notes (Signed)
Pt back from MRI 

## 2017-06-18 NOTE — Progress Notes (Signed)
Orthopedic Tech Progress Note Patient Details:  Laura Shah [birth year] [id number]  Ortho Devices Type of Ortho Device: Crutches Ortho Device/Splint Interventions: Application   Post Interventions Patient Tolerated: Well Instructions Provided: Care of device   Nikki Dom 06/18/2017, 5:12 PM

## 2017-06-18 NOTE — ED Notes (Signed)
Pt complaining of leg going more numb. Let Nurse know. Also reassessed vitals.

## 2017-06-18 NOTE — ED Notes (Signed)
Spoke with nurse to room patient next.

## 2017-06-18 NOTE — ED Triage Notes (Signed)
Pt states during the night she woke up and thought she was going to have to vomit- during the rush to the bathroom she heard a pop in her lower back/right hip area, since that time pt c/o significant pain with movement to her right lower back running down her right leg, it does not radiate into her toes, pain is in her right buttocks as well, pt is able to ambulate but it is very painful

## 2017-06-18 NOTE — ED Notes (Signed)
Pt stating that leg is becoming more numb. Informed Greg - RN.

## 2017-06-18 NOTE — ED Provider Notes (Signed)
[facility] EMERGENCY DEPARTMENT Provider Note   CSN: [id number] Arrival date & time: 06/18/17  0915     History   Chief Complaint Chief Complaint  Patient presents with  . Back Pain  . Leg Pain    HPI Laura Shah is a 37 y.o. female.  HPI 37 year old Afro-American female past medical history significant for GERD, anxiety that presents to the emergency department today for evaluation of acute onset of right low back pain that radiates to her right leg.  Patient reports associated paresthesias.  She also reports associated weakness secondary to pain.  Patient states that this happened earlier this morning when she went to the bathroom after waking up.  Patient states that she felt she needed to throw up because she has acid reflux.  She states that she made a quick turn and felt a pop in her lower back.  Patient states she had immediate pain.  She has been able to ambulate but this causes her significant pain.  Patient went to work today was unable to complete the day and came to the ED for further evaluation.  She took 800 mg ibuprofen prior to arrival with only little relief.  Patient reports paresthesias to the right leg.  She reports pain radiates down to her right foot.  Pain is worse with ambulation, palpation and prolonged sitting.  Patient reports that she has had some lower back pain over the past several weeks and was going to follow-up with orthopedic doctor.  Patient denies any loss of bowel bladder, saddle paresthesias, urinary retention, history of IV drug use, history of cancer. Past Medical History:  Diagnosis Date  . Anemia   . Anxiety   . GERD (gastroesophageal reflux disease)   . Heart valve regurgitation   . 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 Ileus (HCC)   . Interstitial cystitis   . Pancreatitis   . Tachycardia     Patient Active Problem List   Diagnosis Date Noted  . Right leg weakness   . 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  Constipation by delayed colonic transit   . Acute pancreatitis 03/16/2015  . OSA (obstructive sleep apnea) 03/16/2015  . Anemia 03/16/2015  . Hypokalemia 03/16/2015  . 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 Epigastric abdominal pain   . RUQ pain   . Elevated LFTs   . Irritable bowel syndrome with constipation   . Injury of hand, right 08/03/2014  . Interstitial cystitis   . Tachycardia     Past Surgical History:  Procedure Laterality Date  . APPENDECTOMY    . CESAREAN SECTION    . CHOLECYSTECTOMY    . ESOPHAGOGASTRODUODENOSCOPY (EGD) WITH PROPOFOL N/A 03/18/2015   Procedure: ESOPHAGOGASTRODUODENOSCOPY (EGD) WITH PROPOFOL;  Surgeon: Sherrilyn Rist, MD;  Location: [facility] END[facility];  Service: Endoscopy;  Laterality: N/A;  . GASTRECTOMY    . OVARIAN CYST REMOVAL       OB History   None      Home Medications    Prior to Admission medications   Medication Sig Start Date End Date Taking? Authorizing Provider  clonazePAM (KLONOPIN) 0.5 MG tablet Take 0.25 mg by mouth 2 (two) times daily as needed for anxiety.    Yes [provider]  ibuprofen (ADVIL,MOTRIN) 200 MG tablet Take 800 mg by mouth every 6 (six) hours as needed.   Yes [provider]  naproxen (NAPROSYN) 500 MG tablet Take 500 mg by mouth 2 (  two) times daily. 03/31/17  Yes [provider]  pantoprazole (PROTONIX) 40 MG tablet Take 1 tablet (40 mg total) by mouth daily. Patient taking differently: Take 40 mg by mouth at bedtime.  08/28/14  Yes Dione Booze, MD  sertraline (ZOLOFT) 50 MG tablet Take 50 mg by mouth daily at 12 noon. 06/17/17  Yes [provider]  Vitamin D, Ergocalciferol, (DRISDOL) 50000 units CAPS capsule Take 50,000 Units by mouth once a week. 04/11/17  Yes [provider]  cephALEXin (KEFLEX) 500 MG capsule Take 1 capsule (500 mg total) by mouth 4 (four) times daily. Patient not taking: Reported on 06/18/2017 10/23/16   Graciella Freer A, PA-C    cyclobenzaprine (FLEXERIL) 5 MG tablet Take 1 tablet (5 mg total) by mouth 3 (three) times daily as needed for muscle spasms. 06/18/17   Rise Mu, PA-C  doxycycline (VIBRA-TABS) 100 MG tablet Take 1 tablet (100 mg total) by mouth 2 (two) times daily. Patient not taking: Reported on 06/18/2017 10/26/16   Berton Bon, MD  HYDROcodone-acetaminophen (NORCO/VICODIN) 5-325 MG tablet Take 1-2 tablets by mouth every 4 (four) hours as needed. 06/18/17   Demetrios Loll T, PA-C  lidocaine (LIDODERM) 5 % Place 1 patch onto the skin daily. Remove & Discard patch within 12 hours or as directed by MD 06/18/17   Rise Mu, PA-C  linaclotide [facility]) 145 MCG CAPS capsule Take 1 capsule (145 mcg total) by mouth daily before breakfast. Patient not taking: Reported on 06/18/2017 05/23/15   Rachael Fee, MD  predniSONE (STERAPRED UNI-PAK 21 TAB) 10 MG (21) TBPK tablet Take by mouth daily. Take 4 tabs by mouth daily  for 2 days, then 3 tabs for 2 days, then 2 tabs for 2 days, then 1 tabs for 3 days until finished 06/18/17   Khamari Yousuf, Lynann Beaver, PA-C    Family History Family History  Problem Relation Age of Onset  . Breast cancer Maternal Grandmother   . Breast cancer Paternal Grandmother   . Diabetes Paternal Grandmother   . Diabetes Father   . Hypertension Father     Social History Social History   Tobacco Use  . Smoking status: Never Smoker  . Smokeless tobacco: Never Used  Substance Use Topics  . Alcohol use: No    Alcohol/week: 0.0 oz  . Drug use: No     Allergies   Reglan [metoclopramide] and Morphine and related   Review of Systems Review of Systems  All other systems reviewed and are negative.    Physical Exam Updated Vital Signs BP 122/79   Pulse 96   Temp 98.4 F (36.9 C) (Oral)   Resp 18   LMP 06/11/2017 (Exact Date)   SpO2 97%   Physical Exam  Constitutional: She appears well-developed and well-nourished. She appears distressed.  Appears  uncomfortable secondary to pain.  HENT:  Head: Normocephalic and atraumatic.  Eyes: Right eye exhibits no discharge. Left eye exhibits no discharge. No scleral icterus.  Neck: Normal range of motion.  Cardiovascular: Intact distal pulses.  DP pulses are 2+ bilaterally.  Pulmonary/Chest: No respiratory distress.  Musculoskeletal: Normal range of motion.       Back:  Limited range of motion of the right lower extremity secondary to pain in her lower back.  Skin compartments are soft.  DP pulses are 2+ bilaterally.  Brisk cap refill.  Neurological: She is alert.  Strength is 5 out of 5 in left lower extremity.  Strength is 4 out of  5 in right lower extremity that is secondary to pain.  Patient is able to plantarflex and dorsiflex her right foot.  Patient does have range of motion of her right knee.  Perception intact.  Point determination intact.  Skin: Skin is warm and dry. Capillary refill takes less than 2 seconds. No pallor.  Psychiatric: Her behavior is normal. Judgment and thought content normal.  Nursing note and vitals reviewed.    ED Treatments / Results  Labs (all labs ordered are listed, but only abnormal results are displayed) Labs Reviewed - No data to display  EKG None  Radiology Mr Lumbar Spine Wo Contrast  Result Date: 06/18/2017 CLINICAL DATA:  Back pain.  Recent acute onset. EXAM: MRI LUMBAR SPINE WITHOUT CONTRAST TECHNIQUE: Multiplanar, multisequence MR imaging of the lumbar spine was performed. No intravenous contrast was administered. COMPARISON:  None. FINDINGS: Segmentation: Normal. The lowest disc space is considered to be L5-S1. Alignment:  Normal Vertebrae: No acute compression fracture, discitis-osteomyelitis of focal marrow lesion. Conus medullaris and cauda equina: The conus medullaris terminates at the L1 level. The cauda equina and conus medullaris are both normal. Paraspinal and other soft tissues: There are multiple uterine fibroids. Disc levels: T11-T12  is visualized in the sagittal plane only, with no disc herniation or stenosis. The T12-L3 disc levels are normal. L4-L5: Large central disc extrusion with inferior migration to the L5 pedicle level. This moderately narrows the central spinal canal and severely narrows the right lateral recess, exerting mass effect on the descending right L5 nerve root. There is no neural foraminal stenosis. There is mild left facet edema. L5-S1: Normal. The visualized sacrum is normal. IMPRESSION: 1. Large L4-L5 central disc extrusion with inferior migration moderately narrowing the central spinal canal and compressing the descending right L5 nerve root in the lateral recess. 2. Otherwise normal lumbar spine. 3. Fibroid uterus. Electronically Signed   By: Deatra Robinson M.D.   On: 06/18/2017 15:11    Procedures Procedures (including critical care time)  Medications Ordered in ED Medications  lidocaine (LIDODERM) 5 % 1 patch (1 patch Transdermal Patch Applied 06/18/17 1604)  predniSONE (DELTASONE) tablet 60 mg (60 mg Oral Given 06/18/17 1202)  oxyCODONE-acetaminophen (PERCOCET/ROXICET) 5-325 MG per tablet 1 tablet (1 tablet Oral Given 06/18/17 1203)  HYDROmorphone (DILAUDID) injection 0.5 mg (0.5 mg Intramuscular Given 06/18/17 1339)  dexamethasone (DECADRON) injection 10 mg (10 mg Intravenous Given 06/18/17 1602)  HYDROmorphone (DILAUDID) injection 0.5 mg (0.5 mg Intramuscular Given 06/18/17 1600)     Initial Impression / Assessment and Plan / ED Course  I have reviewed the triage vital signs and the nursing notes.  Pertinent labs & imaging results that were available during my care of the patient were reviewed by me and considered in my medical decision making (see chart for details).     She presents to the ED for evaluation of acute onset of right lower side back pain.  This radiates to her right leg with associated paresthesias.  Patient neurovascularly intact.  She denies any red flag symptoms.  She does appear  very uncomfortable secondary to pain.  Patient does not have a record of multiple visits in the ED with and has no prior work-up for similar pain.  Given patient's significant pain MRI was ordered.  Patient's pain has been difficult to control the ED.  She was given additional narcotic pain medication through the IV.  MRI returned that showed large central disc extrusion at L4-L5 with inferior migration narrowing the central spinal  canal compressing the descending right L5 nerve root in the lateral recess.  Otherwise patient has no acute findings.  I did discuss the MRI findings and patient with Dr. Danielle Dess with neurosurgery.  He does recommend that we give patient a dose of Decadron in the ED.  He has recommended that place patient on a prednisone taper pack for 12 days starting tomorrow.  Have asked that I give her narcotic pain medication and some muscle relaxers.  Patient will be seen in the office this week.  I discussed risk with patient including narcotic medications and muscle relaxers.  Patient will continue taking ibuprofen at home.  Will give lidocaine patches.  Have given her crutches to help with her weightbearing and walking.  Patient no signs of cauda equina.  She remains neurovascularly intact.  Pt is hemodynamically stable, in NAD, & able to ambulate in the ED. Evaluation does not show pathology that would require ongoing emergent intervention or inpatient treatment. I explained the diagnosis to the patient. Pain has been managed & has no complaints prior to dc. Pt is comfortable with above plan and is stable for discharge at this time. All questions were answered prior to disposition. Strict return precautions for f/u to the ED were discussed. Encouraged follow up with PCP.   Final Clinical Impressions(s) / ED Diagnoses   Final diagnoses:  Lumbar herniated disc  Right leg pain  Paresthesias    ED Discharge Orders        Ordered    Crutches  Status:  Canceled     06/18/17 1647     HYDROcodone-acetaminophen (NORCO/VICODIN) 5-325 MG tablet  Every 4 hours PRN     06/18/17 1651    cyclobenzaprine (FLEXERIL) 5 MG tablet  3 times daily PRN     06/18/17 1651    lidocaine (LIDODERM) 5 %  Every 24 hours     06/18/17 1651    predniSONE (STERAPRED UNI-PAK 21 TAB) 10 MG (21) TBPK tablet  Daily     06/18/17 1651       Rise Mu, PA-C 06/18/17 1712    Mackuen, Cindee Salt, MD 06/19/17 1546

## 2017-06-18 NOTE — ED Notes (Signed)
Patient verbalized understanding of discharge instructions and denies any further needs or questions at this time. VS stable. Patient ambulatory with steady gait, using crutches. Assisted to ED entrance where friend will be picking patient up. Patient states she called Dr. Verlee Rossetti office and left a voicemail, will try to call again tomorrow morning.

## 2017-06-18 NOTE — Discharge Instructions (Addendum)
Your MRI does show a herniated disc.  Have given you pain medication.  I also given you muscle relaxers.  This medication make you sleepy so do not take them together.  Will continue taking ibuprofen.  Also given you a steroid pack to start taking tomorrow.  Please call today or tomorrow to schedule appointment the neurosurgeon.  Return to the ED if you develop any loss of bowel or bladder, numbness in your groin area or not able to urinate.

## 2017-06-18 NOTE — ED Notes (Signed)
Patient transported to MRI 

## 2017-06-18 NOTE — ED Notes (Signed)
Ortho at bedside.

## 2017-06-25 HISTORY — PX: LUMBAR DISC SURGERY: SHX700

## 2017-10-28 ENCOUNTER — Encounter (HOSPITAL_BASED_OUTPATIENT_CLINIC_OR_DEPARTMENT_OTHER): Payer: Self-pay | Admitting: Emergency Medicine

## 2017-10-28 ENCOUNTER — Emergency Department (HOSPITAL_BASED_OUTPATIENT_CLINIC_OR_DEPARTMENT_OTHER)
Admission: EM | Admit: 2017-10-28 | Discharge: 2017-10-28 | Disposition: A | Discharge status: Home / Self Care | Payer: BC Managed Care – PPO | Attending: Emergency Medicine | Admitting: Emergency Medicine

## 2017-10-28 ENCOUNTER — Other Ambulatory Visit: Payer: Self-pay

## 2017-10-28 DIAGNOSIS — M5441 Lumbago with sciatica, right side: Secondary | ICD-10-CM | POA: Insufficient documentation

## 2017-10-28 DIAGNOSIS — Z79899 Other long term (current) drug therapy: Secondary | ICD-10-CM | POA: Insufficient documentation

## 2017-10-28 DIAGNOSIS — M545 Low back pain: Secondary | ICD-10-CM | POA: Diagnosis present

## 2017-10-28 LAB — PREGNANCY, URINE: Preg Test, Ur: NEGATIVE

## 2017-10-28 MED ORDER — LORAZEPAM 1 MG PO TABS
1.0000 mg | ORAL_TABLET | Freq: Once | ORAL | Status: AC
  Administered 2017-10-28: 1 mg via ORAL
  Filled 2017-10-28: qty 1

## 2017-10-28 MED ORDER — DEXAMETHASONE 4 MG PO TABS
8.0000 mg | ORAL_TABLET | Freq: Once | ORAL | Status: AC
  Administered 2017-10-28: 8 mg via ORAL
  Filled 2017-10-28: qty 2

## 2017-10-28 MED ORDER — GABAPENTIN 300 MG PO CAPS
ORAL_CAPSULE | ORAL | Status: AC
  Administered 2017-10-28: 300 mg
  Filled 2017-10-28: qty 1

## 2017-10-28 MED ORDER — GABAPENTIN 600 MG PO TABS: 300.0000 mg | ORAL_TABLET | Freq: Once | ORAL | Status: DC

## 2017-10-28 MED ORDER — HYDROMORPHONE HCL 1 MG/ML IJ SOLN
1.0000 mg | Freq: Once | INTRAMUSCULAR | Status: AC
  Administered 2017-10-28: 1 mg via INTRAMUSCULAR
  Filled 2017-10-28: qty 1

## 2017-10-28 MED ORDER — DEXAMETHASONE 4 MG PO TABS: 4.0000 mg | ORAL_TABLET | Freq: Two times a day (BID) | ORAL | 0 refills | Status: DC

## 2017-10-28 MED ORDER — CYCLOBENZAPRINE HCL 10 MG PO TABS: 10.0000 mg | ORAL_TABLET | Freq: Three times a day (TID) | ORAL | 0 refills | Status: DC | PRN

## 2017-10-28 NOTE — ED Triage Notes (Signed)
Pt reports lower back pain, Hx herniated disc, had surgery in 05/2017. Denies fall nor injury . Pt on wheelchair , reports unable to ambulate due to severe pain.

## 2017-10-28 NOTE — ED Notes (Signed)
Pt to have steroidal injection on Friday- spoke with PCP this AM due to onset of severe pain last PM. Told to be seen in ER. Pt took hydrocodone and ibuprofen this AM with no relief.

## 2017-11-06 NOTE — ED Provider Notes (Signed)
[facility] EMERGENCY DEPARTMENT Provider Note   CSN: [id number] Arrival date & time: 10/28/17  1737     History   Chief Complaint Chief Complaint  Patient presents with  . Back Pain    HPI Laura Shah is a 37 y.o. female.  HPI   38 year old female with lower back pain.  History of the same.  She reports prior herniated disc.  She is actually scheduled for steroid injection this Friday.  She was advised to come the emergency room because of worsening pain.  She has been taking hydrocodone and ibuprofen with minimal improvement.  No acute neurological complaints.  No fevers or chills.  Past Medical History:  Diagnosis Date  . Anemia   . Anxiety   . GERD (gastroesophageal reflux disease)   . Heart valve regurgitation   . 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 Ileus (HCC)   . Interstitial cystitis   . Pancreatitis   . Tachycardia     Patient Active Problem List   Diagnosis Date Noted  . Right leg weakness   . 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 Constipation by delayed colonic transit   . Acute pancreatitis 03/16/2015  . OSA (obstructive sleep apnea) 03/16/2015  . Anemia 03/16/2015  . Hypokalemia 03/16/2015  . 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 Epigastric abdominal pain   . RUQ pain   . Elevated LFTs   . Irritable bowel syndrome with constipation   . Injury of hand, right 08/03/2014  . Interstitial cystitis   . Tachycardia     Past Surgical History:  Procedure Laterality Date  . APPENDECTOMY    . CESAREAN SECTION    . CHOLECYSTECTOMY    . ESOPHAGOGASTRODUODENOSCOPY (EGD) WITH PROPOFOL N/A 03/18/2015   Procedure: ESOPHAGOGASTRODUODENOSCOPY (EGD) WITH PROPOFOL;  Surgeon: Sherrilyn Rist, MD;  Location: [facility] ENDOSCOPY;  Service: Endoscopy;  Laterality: N/A;  . GASTRECTOMY    . OVARIAN CYST REMOVAL       OB History   None      Home Medications    Prior to Admission medications   Medication Sig Start Date End Date Taking?  Authorizing Provider  clonazePAM (KLONOPIN) 0.5 MG tablet Take 0.25 mg by mouth 2 (two) times daily as needed for anxiety.     [provider]  cyclobenzaprine (FLEXERIL) 10 MG tablet Take 1 tablet (10 mg total) by mouth 3 (three) times daily as needed for muscle spasms. 10/28/17   Raeford Razor, MD  cyclobenzaprine (FLEXERIL) 5 MG tablet Take 1 tablet (5 mg total) by mouth 3 (three) times daily as needed for muscle spasms. 06/18/17   Rise Mu, PA-C  dexamethasone (DECADRON) 4 MG tablet Take 1 tablet (4 mg total) by mouth 2 (two) times daily. 10/28/17   Raeford Razor, MD  HYDROcodone-acetaminophen (NORCO/VICODIN) 5-325 MG tablet Take 1-2 tablets by mouth every 4 (four) hours as needed. 06/18/17   Rise Mu, PA-C  ibuprofen (ADVIL,MOTRIN) 200 MG tablet Take 800 mg by mouth every 6 (six) hours as needed.    [provider]  lidocaine (LIDODERM) 5 % Place 1 patch onto the skin daily. Remove & Discard patch within 12 hours or as directed by MD 06/18/17   Rise Mu, PA-C  linaclotide [facility]) 145 MCG CAPS capsule Take 1 capsule (145 mcg total) by mouth daily before breakfast. Patient not taking: Reported on 06/18/2017 05/23/15   Rachael Fee, MD  naproxen (NAPROSYN) 500 MG tablet Take  500 mg by mouth 2 (two) times daily. 03/31/17   [provider]  pantoprazole (PROTONIX) 40 MG tablet Take 1 tablet (40 mg total) by mouth daily. Patient taking differently: Take 40 mg by mouth at bedtime.  08/28/14   Dione Booze, MD  sertraline (ZOLOFT) 50 MG tablet Take 50 mg by mouth daily at 12 noon. 06/17/17   [provider]  Vitamin D, Ergocalciferol, (DRISDOL) 50000 units CAPS capsule Take 50,000 Units by mouth once a week. 04/11/17   [provider]    Family History Family History  Problem Relation Age of Onset  . Breast cancer Maternal Grandmother   . Breast cancer Paternal Grandmother   . Diabetes Paternal Grandmother   . Diabetes  Father   . Hypertension Father     Social History Social History   Tobacco Use  . Smoking status: Never Smoker  . Smokeless tobacco: Never Used  Substance Use Topics  . Alcohol use: No    Alcohol/week: 0.0 standard drinks  . Drug use: No     Allergies   Reglan [metoclopramide] and Morphine and related   Review of Systems Review of Systems  All systems reviewed and negative, other than as noted in HPI.  Physical Exam Updated Vital Signs BP 124/80 (BP Location: Right Arm)   Pulse 85   Temp 98.5 F (36.9 C) (Oral)   Resp 16   SpO2 100%   Physical Exam  Constitutional: She appears well-developed and well-nourished. No distress.  HENT:  Head: Normocephalic and atraumatic.  Eyes: Conjunctivae are normal. Right eye exhibits no discharge. Left eye exhibits no discharge.  Neck: Neck supple.  Cardiovascular: Normal rate, regular rhythm and normal heart sounds. Exam reveals no gallop and no friction rub.  No murmur heard. Pulmonary/Chest: Effort normal and breath sounds normal. No respiratory distress.  Abdominal: Soft. She exhibits no distension. There is no tenderness.  Musculoskeletal: She exhibits no edema or tenderness.  Sit up from a laying position in bed unassisted although with some apparent discomfort.  Diffuse tenderness across the mid to lower lumbar spine both paraspinally and in the midline.  Does not seem sniffily worse in the midline.  Strength is 5 out of 5 bilateral lower extremities.  Sensation is intact light touch.  Neurological: She is alert.  Skin: Skin is warm and dry.  Psychiatric: She has a normal mood and affect. Her behavior is normal. Thought content normal.  Nursing note and vitals reviewed.    ED Treatments / Results  Labs (all labs ordered are listed, but only abnormal results are displayed) Labs Reviewed  PREGNANCY, URINE    EKG None  Radiology No results found.  Procedures Procedures (including critical care  time)  Medications Ordered in ED Medications  HYDROmorphone (DILAUDID) injection 1 mg (1 mg Intramuscular Given 10/28/17 2031)  LORazepam (ATIVAN) tablet 1 mg (1 mg Oral Given 10/28/17 2031)  dexamethasone (DECADRON) tablet 8 mg (8 mg Oral Given 10/28/17 2031)  gabapentin (NEURONTIN) 300 MG capsule (300 mg  Given 10/28/17 2031)     Initial Impression / Assessment and Plan / ED Course  I have reviewed the triage vital signs and the nursing notes.  Pertinent labs & imaging results that were available during my care of the patient were reviewed by me and considered in my medical decision making (see chart for details).     37 year old female with lower back pain.  Treated symptomatically with improvement.  Plan continue symptomatic treatment.  Scheduled to have a  steroid injection on Friday.  Follow-up then.  Emergent return precautions were discussed.  No  concerning "red flags."  Final Clinical Impressions(s) / ED Diagnoses   Final diagnoses:  Midline low back pain with right-sided sciatica, unspecified chronicity    ED Discharge Orders         Ordered    dexamethasone (DECADRON) 4 MG tablet  2 times daily     10/28/17 2203    cyclobenzaprine (FLEXERIL) 10 MG tablet  3 times daily PRN     10/28/17 2203           Raeford Razor, MD 11/06/17 1644

## 2018-04-30 ENCOUNTER — Other Ambulatory Visit: Payer: Self-pay | Admitting: Neurological Surgery

## 2018-04-30 DIAGNOSIS — M5416 Radiculopathy, lumbar region: Secondary | ICD-10-CM

## 2018-06-16 ENCOUNTER — Other Ambulatory Visit: Payer: Self-pay

## 2018-06-26 ENCOUNTER — Ambulatory Visit
Admission: RE | Admit: 2018-06-26 | Discharge: 2018-06-26 | Disposition: A | Discharge status: Home / Self Care | Payer: BC Managed Care – PPO | Source: Ambulatory Visit | Attending: Neurological Surgery | Admitting: Neurological Surgery

## 2018-06-26 ENCOUNTER — Other Ambulatory Visit: Payer: Self-pay

## 2018-06-26 DIAGNOSIS — M5416 Radiculopathy, lumbar region: Secondary | ICD-10-CM

## 2018-06-26 MED ORDER — GADOBENATE DIMEGLUMINE 529 MG/ML IV SOLN
13.0000 mL | Freq: Once | INTRAVENOUS | Status: AC | PRN
  Administered 2018-06-26: 13 mL via INTRAVENOUS

## 2018-06-30 ENCOUNTER — Other Ambulatory Visit: Payer: Self-pay

## 2018-07-17 ENCOUNTER — Other Ambulatory Visit: Payer: Self-pay | Admitting: Neurological Surgery

## 2018-07-18 ENCOUNTER — Other Ambulatory Visit: Payer: Self-pay | Admitting: *Deleted

## 2018-08-18 ENCOUNTER — Telehealth (HOSPITAL_COMMUNITY): Payer: Self-pay | Admitting: Rehabilitation

## 2018-08-18 NOTE — Telephone Encounter (Signed)

## 2018-08-19 ENCOUNTER — Encounter: Payer: Self-pay | Admitting: Vascular Surgery

## 2018-08-19 ENCOUNTER — Other Ambulatory Visit: Payer: Self-pay | Admitting: Obstetrics & Gynecology

## 2018-08-19 ENCOUNTER — Other Ambulatory Visit: Payer: Self-pay

## 2018-08-19 ENCOUNTER — Ambulatory Visit (INDEPENDENT_AMBULATORY_CARE_PROVIDER_SITE_OTHER): Payer: BC Managed Care – PPO | Admitting: Vascular Surgery

## 2018-08-19 VITALS — BP 153/102 | HR 90 | Temp 97.5°F | Resp 20 | Ht 62.0 in | Wt 154.0 lb

## 2018-08-19 DIAGNOSIS — N970 Female infertility associated with anovulation: Secondary | ICD-10-CM

## 2018-08-19 DIAGNOSIS — M5136 Other intervertebral disc degeneration, lumbar region: Secondary | ICD-10-CM

## 2018-08-19 NOTE — Progress Notes (Signed)
Vascular and Vein Specialist of [place]  Patient name: Laura Shah MRN: [id number] DOB: 1980/09/22 Sex: female  REASON FOR CONSULT: Discuss anterior exposure for L4-5 disc fusion  HPI: Laura Shah is a 38 y.o. female, who is here today for discussion of anterior exposure for L4-5 disc surgery.  She is been seen in consultation with Dr. Ellene Route who is recommended anterior approach for fusion.  She is quite uncomfortable.  She has pain in her back and also pain and numbness in her right leg.  This is been progressive despite conservative therapy.  She has no history of cardiac disease.  Does have a significant past history of DVT associated with prior surgeries.  Apparently had some type of hepatic blood clots at the time of her gastric sleeve resection.  Had been on oral anticoagulant and also Lovenox injections.  She apparently underwent hypercoagulable work-up and [place].  I do not have the results of this evaluation.  She has had prior intra-abdominal surgery.  Has had cesarean section.  Also appendectomy and ovarian cyst removal in 2007 and gastric sleeve resection in December 2017.  She has had prior discectomy  Past Medical History:  Diagnosis Date  . Anemia   . Anxiety   . Back pain   . GERD (gastroesophageal reflux disease)   . Heart valve regurgitation   . 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 Ileus ([place])   . Interstitial cystitis   . Lumbar herniated disc 04/17/2017   L4/L5. Ruptured.  . Muscle weakness   . Pancreatitis   . Tachycardia     Family History  Problem Relation Age of Onset  . Breast cancer Maternal Grandmother   . Breast cancer Paternal Grandmother   . Diabetes Paternal Grandmother   . Diabetes Father   . Hypertension Father     SOCIAL HISTORY: Social History   Socioeconomic History  . Marital status: Single    Spouse name: Not on file  . Number of children: 2  . Years of education: Grad   . Highest  education level: Not on file  Occupational History  . Not on file  Social Needs  . Financial resource strain: Not on file  . Food insecurity    Worry: Not on file    Inability: Not on file  . Transportation needs    Medical: Not on file    Non-medical: Not on file  Tobacco Use  . Smoking status: Never Smoker  . Smokeless tobacco: Never Used  Substance and Sexual Activity  . Alcohol use: No    Alcohol/week: 0.0 standard drinks    Frequency: Never  . Drug use: No  . Sexual activity: Yes    Birth control/protection: I.U.D.  Lifestyle  . Physical activity    Days per week: Not on file    Minutes per session: Not on file  . Stress: Not on file  Relationships  . Social Herbalist on phone: Not on file    Gets together: Not on file    Attends religious service: Not on file    Active member of club or organization: Not on file    Attends meetings of clubs or organizations: Not on file    Relationship status: Not on file  . Intimate partner violence    Fear of current or ex partner: Not on file    Emotionally abused: Not on file    Physically abused: Not on file  Forced sexual activity: Not on file  Other Topics Concern  . Not on file  Social History Narrative   Drinks about 1-2 cans of soda a day     Allergies  Allergen Reactions  . Reglan [Metoclopramide] Anxiety  . Morphine And Related Itching    Current Outpatient Medications  Medication Sig Dispense Refill  . HYDROcodone-acetaminophen (NORCO/VICODIN) 5-325 MG tablet Take 1-2 tablets by mouth every 4 (four) hours as needed. 10 tablet 0  . pantoprazole (PROTONIX) 40 MG tablet Take 1 tablet (40 mg total) by mouth daily. (Patient taking differently: Take 40 mg by mouth at bedtime. ) 30 tablet 0   No current facility-administered medications for this visit.     REVIEW OF SYSTEMS:  [X]  denotes positive finding, [ ]  denotes negative finding Cardiac  Comments:  Chest pain or chest pressure:    Shortness  of breath upon exertion:    Short of breath when lying flat:    Irregular heart rhythm:        Vascular    Pain in calf, thigh, or hip brought on by ambulation:    Pain in feet at night that wakes you up from your sleep:  x x neurogenic  Blood clot in your veins:    Leg swelling:      x   Pulmonary    Oxygen at home:    Productive cough:     Wheezing:         Neurologic    Sudden weakness in arms or legs:  x   Sudden numbness in arms or legs:  x   Sudden onset of difficulty speaking or slurred speech:    Temporary loss of vision in one eye:     Problems with dizziness:         Gastrointestinal    Blood in stool:     Vomited blood:         Genitourinary    Burning when urinating:     Blood in urine:        Psychiatric    Major depression:         Hematologic    Bleeding problems:    Problems with blood clotting too easily:        Skin    Rashes or ulcers:        Constitutional    Fever or chills:      PHYSICAL EXAM: Vitals:   08/19/18 0957  BP: (!) 153/102  Pulse: 90  Resp: 20  Temp: (!) 97.5 F (36.4 C)  SpO2: 97%  Weight: 154 lb (69.9 kg)  Height: 5\' 2"  (1.575 m)    GENERAL: The patient is a well-nourished female, in no acute distress. The vital signs are documented above. CARDIOVASCULAR: 2+ radial and 2+ dorsalis pedis pulses bilaterally PULMONARY: There is good air exchange  ABDOMEN: Soft and non-tender  MUSCULOSKELETAL: There are no major deformities or cyanosis. NEUROLOGIC: No focal weakness or paresthesias are detected. SKIN: There are no ulcers or rashes noted. PSYCHIATRIC: The patient has a normal affect.  DATA:  CT scan from 2017 was reviewed.  This revealed no evidence of atherosclerotic change in her aortoiliac segments  MEDICAL ISSUES: Severe degenerative disc disease with plan for L4-5 fusion from anterior approach.  I do not feel that she has had and has any prohibitive risk for surgery.  She has had extensive history of DVT in the  past so will require as aggressive atelectasis possible given spine surgery.  We will proceed as scheduled for 08/28/2018   Larina Earthlyodd F. Yonatan Guitron, MD [facility] Office Tel [phone] Pager [phone]

## 2018-08-22 NOTE — Progress Notes (Signed)
[id number] [place] [place] - [address] [place] [id number] Phone: [phone] Fax: [phone]    Your procedure is scheduled on Thursday, July 30th.  [facility] Main Entr[facility] "A" at 5:30 A.M., and check in at the Admitting office.  Call this number if you have problems the morning of surgery:  [phone]  Call [phone] if you have any questions prior to your surgery date Monday-Friday 8am-4pm   Remember:  Do not eat or drink after midnight the night before your surgery   Take these medicines the morning of surgery with A SIP OF WATER   If needed - HYDROcodone-acetaminophen (NORCO/VICODIN)   7 days prior to surgery STOP taking any Aspirin (unless otherwise instructed by your surgeon), meloxicam (MOBIC),  Aleve, Naproxen, Ibuprofen, Motrin, Advil, Goody's, BC's, all herbal medications, fish oil, and all vitamins.   The Morning of Surgery  Do not wear jewelry, make-up or nail polish.  Do not wear lotions, powders, or perfumes/colognes, or deodorant  Do not shave 48 hours prior to surgery.    Do not bring valuables to the hospital.  [facility] is not responsible for any belongings or valuables.  If you are a smoker, DO NOT Smoke 24 hours prior to surgery IF you wear a CPAP at night please bring your mask, tubing, and machine the morning of surgery   Remember that you must have someone to transport you home after your surgery, and remain with you for 24 hours if you are discharged the same day.  Contacts, glasses, hearing aids, dentures or bridgework may not be worn into surgery.   Leave your suitcase in the car.  After surgery it may be brought to your room.  For patients admitted to the hospital, discharge time will be determined by your treatment team.  Patients discharged the day of surgery will not be allowed to drive home.   Special instructions:   [place]- Preparing For Surgery  Before surgery, you can  play an important role. Because skin is not sterile, your skin needs to be as free of germs as possible. You can reduce the number of germs on your skin by washing with CHG (chlorahexidine gluconate) Soap before surgery.  CHG is an antiseptic cleaner which kills germs and bonds with the skin to continue killing germs even after washing.    Oral Hygiene is also important to reduce your risk of infection.  Remember - BRUSH YOUR TEETH THE MORNING OF SURGERY WITH YOUR REGULAR TOOTHPASTE  Please do not use if you have an allergy to CHG or antibacterial soaps. If your skin becomes reddened/irritated stop using the CHG.  Do not shave (including legs and underarms) for at least 48 hours prior to first CHG shower. It is OK to shave your face.  Please follow these instructions carefully.   1. Shower the NIGHT BEFORE SURGERY and the MORNING OF SURGERY with CHG Soap.   2. If you chose to wash your hair, wash your hair first as usual with your normal shampoo.  3. After you shampoo, rinse your hair and body thoroughly to remove the shampoo.  4. Use CHG as you would any other liquid soap. You can apply CHG directly to the skin and wash gently with a scrungie or a clean washcloth.   5. Apply the CHG Soap to your body ONLY FROM THE NECK DOWN.  Do not use on open wounds or open sores. Avoid contact with your eyes, ears, mouth and  genitals (private parts). Wash Face and genitals (private parts)  with your normal soap.   6. Wash thoroughly, paying special attention to the area where your surgery will be performed.  7. Thoroughly rinse your body with warm water from the neck down.  8. DO NOT shower/wash with your normal soap after using and rinsing off the CHG Soap.  9. Pat yourself dry with a CLEAN TOWEL.  10. Wear CLEAN PAJAMAS to bed the night before surgery, wear comfortable clothes the morning of surgery  11. Place CLEAN SHEETS on your bed the night of your first shower and DO NOT SLEEP WITH  PETS.  Day of Surgery:  Do not apply any deodorants/lotions. Please shower the morning of surgery with the CHG soap  Please wear clean clothes to the hospital/surgery center.   Remember to brush your teeth WITH YOUR REGULAR TOOTHPASTE.  Please read over the following fact sheets that you were given.

## 2018-08-25 ENCOUNTER — Other Ambulatory Visit (HOSPITAL_COMMUNITY)
Admission: RE | Admit: 2018-08-25 | Discharge: 2018-08-25 | Disposition: A | Discharge status: Home / Self Care | Payer: BC Managed Care – PPO | Source: Ambulatory Visit | Attending: Neurological Surgery | Admitting: Neurological Surgery

## 2018-08-25 ENCOUNTER — Encounter (HOSPITAL_COMMUNITY)
Admission: RE | Admit: 2018-08-25 | Discharge: 2018-08-25 | Disposition: A | Discharge status: Home / Self Care | Payer: BC Managed Care – PPO | Source: Ambulatory Visit | Attending: Neurological Surgery | Admitting: Neurological Surgery

## 2018-08-25 ENCOUNTER — Encounter (HOSPITAL_COMMUNITY): Payer: Self-pay

## 2018-08-25 ENCOUNTER — Other Ambulatory Visit: Payer: Self-pay

## 2018-08-25 DIAGNOSIS — Z01812 Encounter for preprocedural laboratory examination: Secondary | ICD-10-CM | POA: Diagnosis not present

## 2018-08-25 DIAGNOSIS — Z1159 Encounter for screening for other viral diseases: Secondary | ICD-10-CM | POA: Diagnosis not present

## 2018-08-25 LAB — SURGICAL PCR SCREEN
MRSA, PCR: NEGATIVE
Staphylococcus aureus: NEGATIVE

## 2018-08-25 LAB — BASIC METABOLIC PANEL
Anion gap: 10 (ref 5–15)
BUN: 13 mg/dL (ref 6–20)
CO2: 25 mmol/L (ref 22–32)
Calcium: 9.4 mg/dL (ref 8.9–10.3)
Chloride: 105 mmol/L (ref 98–111)
Creatinine, Ser: 0.87 mg/dL (ref 0.44–1.00)
GFR calc Af Amer: >60 mL/min (ref >60)
GFR calc non Af Amer: >60 mL/min (ref >60)
Glucose, Bld: 106 mg/dL — ABNORMAL HIGH (ref 70–99)
Potassium: 3.9 mmol/L (ref 3.5–5.1)
Sodium: 140 mmol/L (ref 135–145)

## 2018-08-25 LAB — SARS CORONAVIRUS 2 (TAT 6-24 HRS): SARS Coronavirus 2: NEGATIVE

## 2018-08-25 LAB — CBC
HCT: 39.6 % (ref 36.0–46.0)
Hemoglobin: 12.4 g/dL (ref 12.0–15.0)
MCH: 27 pg (ref 26.0–34.0)
MCHC: 31.3 g/dL (ref 30.0–36.0)
MCV: 86.3 fL (ref 80.0–100.0)
Platelets: 263 10*3/uL (ref 150–400)
RBC: 4.59 MIL/uL (ref 3.87–5.11)
RDW: 13 % (ref 11.5–15.5)
WBC: 7.7 10*3/uL (ref 4.0–10.5)
nRBC: 0 % (ref 0.0–0.2)

## 2018-08-25 LAB — TYPE AND SCREEN
ABO/RH(D): B NEG
Antibody Screen: NEGATIVE

## 2018-08-25 NOTE — Progress Notes (Signed)
PCP - Kathyrn Lass Cardiologist - denies  Chest x-ray - not needed EKG - not needed Stress Test - >6 years ECHO - denies Cardiac Cath - denies  Anesthesia review: NO  Patient denies shortness of breath, fever, cough and chest pain at PAT appointment   Patient verbalized understanding of instructions that were given to them at the PAT appointment. Patient was also instructed that they will need to review over the PAT instructions again at home before surgery.

## 2018-08-26 LAB — ABO/RH: ABO/RH(D): B NEG

## 2018-08-28 ENCOUNTER — Encounter (HOSPITAL_COMMUNITY): Admission: RE | Payer: Self-pay | Source: Home / Self Care

## 2018-08-28 ENCOUNTER — Inpatient Hospital Stay (HOSPITAL_COMMUNITY)
Admission: RE | Admit: 2018-08-28 | Payer: BC Managed Care – PPO | Source: Home / Self Care | Admitting: Neurological Surgery

## 2018-08-28 SURGERY — ANTERIOR LUMBAR FUSION 1 LEVEL
Anesthesia: General

## 2018-08-29 ENCOUNTER — Other Ambulatory Visit: Payer: Self-pay | Admitting: *Deleted

## 2018-09-26 NOTE — Pre-Procedure Instructions (Signed)
[id number] [address], [place] [address] Phone: [phone] Fax: [phone]      Your procedure is scheduled on  10-02-18  Report to [facility] Main Entrance "A" at 0530 A.M., and check in at the Admitting office.  Call this number if you have problems the morning of surgery:  [phone]  Call [phone] if you have any questions prior to your surgery date Monday-Friday 8am-4pm    Remember:  Do not eat or drink after midnight the night before your surgery   Take these medicines the morning of surgery with A SIP OF WATER : HYDROcodone-acetaminophen (NORCO/VICODIN)as needed pantoprazole (PROTONIX)  7 days prior to surgery STOP taking any Aspirin (unless otherwise instructed by your surgeon), Aleve, Naproxen, Ibuprofen, Motrin, Advil, Goody's, BC's, all herbal medications, fish oil, and all vitamins.   The Morning of Surgery  Do not wear jewelry, make-up or nail polish.  Do not wear lotions, powders, or perfumes or deodorant  Do not shave 48 hours prior to surgery.   Do not bring valuables to the hospital.  [facility] is not responsible for any belongings or valuables.  If you are a smoker, DO NOT Smoke 24 hours prior to surgery IF you wear a CPAP at night please bring your mask, tubing, and machine the morning of surgery   Remember that you must have someone to transport you home after your surgery, and remain with you for 24 hours if you are discharged the same day.  Contacts, glasses, hearing aids, dentures or bridgework may not be worn into surgery.   Leave your suitcase in the car.  After surgery it may be brought to your room.  For patients admitted to the hospital, discharge time will be determined by your treatment team.  Patients discharged the day of surgery will not be allowed to drive home.    Special instructions:   [place]- Preparing For Surgery  Before surgery, you can play an  important role. Because skin is not sterile, your skin needs to be as free of germs as possible. You can reduce the number of germs on your skin by washing with CHG (chlorahexidine gluconate) Soap before surgery.  CHG is an antiseptic cleaner which kills germs and bonds with the skin to continue killing germs even after washing.    Oral Hygiene is also important to reduce your risk of infection.  Remember - BRUSH YOUR TEETH THE MORNING OF SURGERY WITH YOUR REGULAR TOOTHPASTE  Please do not use if you have an allergy to CHG or antibacterial soaps. If your skin becomes reddened/irritated stop using the CHG.  Do not shave (including legs and underarms) for at least 48 hours prior to first CHG shower. It is OK to shave your face.  Please follow these instructions carefully.   1. Shower the NIGHT BEFORE SURGERY and the MORNING OF SURGERY with CHG Soap.   2. If you chose to wash your hair, wash your hair first as usual with your normal shampoo.  3. After you shampoo, rinse your hair and body thoroughly to remove the shampoo.  4. Use CHG as you would any other liquid soap. You can apply CHG directly to the skin and wash gently with a scrungie or a clean washcloth.   5. Apply the CHG Soap to your body ONLY FROM THE NECK DOWN.  Do not use on open wounds or open sores. Avoid contact with your eyes, ears, mouth and genitals (private  parts). Wash Face and genitals (private parts)  with your normal soap.   6. Wash thoroughly, paying special attention to the area where your surgery will be performed.  7. Thoroughly rinse your body with warm water from the neck down.  8. DO NOT shower/wash with your normal soap after using and rinsing off the CHG Soap.  9. Pat yourself dry with a CLEAN TOWEL.  10. Wear CLEAN PAJAMAS to bed the night before surgery, wear comfortable clothes the morning of surgery  11. Place CLEAN SHEETS on your bed the night of your first shower and DO NOT SLEEP WITH PETS.   Day of  Surgery:  Do not apply any deodorants/lotions. Please shower the morning of surgery with the CHG soap  Please wear clean clothes to the hospital/surgery center.   Remember to brush your teeth WITH YOUR REGULAR TOOTHPASTE.   Please read over the  fact sheets that you were given.

## 2018-09-29 ENCOUNTER — Other Ambulatory Visit (HOSPITAL_COMMUNITY)
Admission: RE | Admit: 2018-09-29 | Discharge: 2018-09-29 | Disposition: A | Discharge status: Home / Self Care | Payer: BC Managed Care – PPO | Source: Ambulatory Visit | Attending: Neurological Surgery | Admitting: Neurological Surgery

## 2018-09-29 ENCOUNTER — Other Ambulatory Visit: Payer: Self-pay

## 2018-09-29 ENCOUNTER — Encounter (HOSPITAL_COMMUNITY)
Admission: RE | Admit: 2018-09-29 | Discharge: 2018-09-29 | Disposition: A | Discharge status: Home / Self Care | Payer: BC Managed Care – PPO | Source: Ambulatory Visit | Attending: Neurological Surgery | Admitting: Neurological Surgery

## 2018-09-29 ENCOUNTER — Encounter (HOSPITAL_COMMUNITY): Payer: Self-pay

## 2018-09-29 DIAGNOSIS — Z01818 Encounter for other preprocedural examination: Secondary | ICD-10-CM | POA: Insufficient documentation

## 2018-09-29 HISTORY — DX: Other symptoms and signs involving the musculoskeletal system: R29.898

## 2018-09-29 LAB — TYPE AND SCREEN
ABO/RH(D): B NEG
Antibody Screen: NEGATIVE

## 2018-09-29 LAB — CBC
HCT: 40.1 % (ref 36.0–46.0)
Hemoglobin: 12.2 g/dL (ref 12.0–15.0)
MCH: 26.3 pg (ref 26.0–34.0)
MCHC: 30.4 g/dL (ref 30.0–36.0)
MCV: 86.4 fL (ref 80.0–100.0)
Platelets: 238 10*3/uL (ref 150–400)
RBC: 4.64 MIL/uL (ref 3.87–5.11)
RDW: 14.1 % (ref 11.5–15.5)
WBC: 4.6 10*3/uL (ref 4.0–10.5)
nRBC: 0 % (ref 0.0–0.2)

## 2018-09-29 LAB — BASIC METABOLIC PANEL WITH GFR
Anion gap: 7 (ref 5–15)
BUN: 9 mg/dL (ref 6–20)
CO2: 24 mmol/L (ref 22–32)
Calcium: 9.4 mg/dL (ref 8.9–10.3)
Chloride: 108 mmol/L (ref 98–111)
Creatinine, Ser: 0.77 mg/dL (ref 0.44–1.00)
GFR calc Af Amer: >60 mL/min
GFR calc non Af Amer: >60 mL/min
Glucose, Bld: 90 mg/dL (ref 70–99)
Potassium: 3.5 mmol/L (ref 3.5–5.1)
Sodium: 139 mmol/L (ref 135–145)

## 2018-09-29 LAB — SARS CORONAVIRUS 2 (TAT 6-24 HRS): SARS Coronavirus 2: NEGATIVE

## 2018-09-29 LAB — SURGICAL PCR SCREEN
MRSA, PCR: NEGATIVE
Staphylococcus aureus: NEGATIVE

## 2018-09-29 NOTE — Progress Notes (Signed)
PCP - Dr. Kathyrn Lass Cardiologist - denies  Chest x-ray - denies EKG - 09/29/2018 Stress Test - per patient "about 5-6 years ago" ECHO - per patient "about 5-6 years ago" Cardiac Cath - denies  Sleep Study - denies CPAP - N/A  Blood Thinner Instructions: N/A Aspirin Instructions: N/A  Anesthesia review: YES, cardiac studies requested, hx blood clots  Coronavirus Screening  Have you experienced the following symptoms:  Cough yes/no: No Fever (>100.41F)  yes/no: No Runny nose yes/no: No Sore throat yes/no: No Difficulty breathing/shortness of breath  yes/no: No  Have you or a family member traveled in the last 14 days and where? yes/no: No  If the patient indicates "YES" to the above questions, their PAT will be rescheduled to limit the exposure to others and, the surgeon will be notified. THE PATIENT WILL NEED TO BE ASYMPTOMATIC FOR 14 DAYS.   If the patient is not experiencing any of these symptoms, the PAT nurse will instruct them to NOT bring anyone with them to their appointment since they may have these symptoms or traveled as well.   Please remind your patients and families that hospital visitation restrictions are in effect and the importance of the restrictions.   Patient denies shortness of breath, fever, cough and chest pain at PAT appointment  Patient verbalized understanding of instructions that were given to them at the PAT appointment. Patient was also instructed that they will need to review over the PAT instructions again at home before surgery.

## 2018-09-30 ENCOUNTER — Encounter (HOSPITAL_COMMUNITY): Payer: Self-pay

## 2018-09-30 DIAGNOSIS — Z8679 Personal history of other diseases of the circulatory system: Secondary | ICD-10-CM

## 2018-09-30 DIAGNOSIS — Z872 Personal history of diseases of the skin and subcutaneous tissue: Secondary | ICD-10-CM

## 2018-09-30 DIAGNOSIS — L0390 Cellulitis, unspecified: Secondary | ICD-10-CM

## 2018-09-30 HISTORY — DX: Cellulitis, unspecified: L03.90

## 2018-09-30 HISTORY — DX: Personal history of diseases of the skin and subcutaneous tissue: Z87.2

## 2018-09-30 HISTORY — DX: Personal history of other diseases of the circulatory system: Z86.79

## 2018-09-30 NOTE — Progress Notes (Addendum)
Anesthesia Chart Review:  Case: [id number] Date/Time: 10/02/18 0715   Procedures:      Lumbar 4-5 Anterior lumbar interbody (N/A ) - Lumbar 4-5 Anterior lumbar interbody     ABDOMINAL EXPOSURE (N/A )   Anesthesia type: General   [id number] diagnosis: Degeneration of lumbar intervertebral disc   [facility] RO[facility] / [facility]   Surgeon: Barnett AbuElsner, Henry, MD; Early, Kristen Loaderodd F, MD      DISCUSSION: Patient is a 38 year old female scheduled for the above procedure.  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  [facility] and [facility], but in the interim, patient was able verbalize report findings to me from her electronic medical record [facility]. She saw cardiologist Armanda Magicurner, Traci, MD in 03/2012. Stress test showed "no ischemia" and echo showed "normal LVF" with "trivial" leakage of PV, MV, TV. No on-going cardiology follow-up recommended. Patient says tachycardia improved since then. She had bariatric surgery in 2016. Denied chest pain and SOB at PAT.   09/29/18 COVID-19 test negative. If formal copies of stress and echo received then I will update in CV section, otherwise if no acute changes then I would anticipate that she can proceed as planned. She denied any anesthesia concerns. She will need urine pregnancy test on the day of surgery.   VS: BP 132/74   Pulse 97   Temp 36.7 C   Resp 20   Ht 5\' 2"  (1.575 m)   Wt  68.6 kg   LMP 09/08/2018   SpO2 97%   BMI 27.67 kg/m     PROVIDERS: Sigmund HazelMiller, Lisa, MD is PCP - She is not followed by cardiology routinely, but saw Armanda Magicurner, Traci, MD in 2014 with unremarkable stress and echo.   LABS: Labs reviewed: Acceptable for surgery. (all labs ordered are listed, but only abnormal results are displayed)  Labs Reviewed  SURGICAL PCR SCREEN  CBC  BASIC METABOLIC PANEL  TYPE AND SCREEN     IMAGES: MRI L-spine 06/26/18: IMPRESSION: - Status post RIGHT L4-5 laminotomy and discectomy. Central and rightward disc extrusion, with slight caudal migration, slightly increased from most recent priors; borderline subarticular zone narrowing could affect the RIGHT L5 nerve root. - Additional low level enhancement in the RIGHT L4-5 subarticular zone, consistent with mild fibrosis.   EKG: 09/29/18: NSR   CV: See DISCUSSION.   Past Medical History:  Diagnosis Date  . Anemia   . Anxiety   . Back pain   . GERD (gastroesophageal reflux disease)   . Heart valve regurgitation    trivial PR, MR, TR 2014 echo [facility]  . 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 Ileus ([facility])   . Interstitial cystitis   . Lumbar herniated disc 04/17/2017   L4/L5. Ruptured.  . Pancreatitis   . Right leg weakness    and numbness   . Tachycardia     Past Surgical History:  Procedure Laterality Date  . APPENDECTOMY    . CESAREAN SECTION    .  CHOLECYSTECTOMY    . ESOPHAGOGASTRODUODENOSCOPY (EGD) WITH PROPOFOL N/A 03/18/2015   Procedure: ESOPHAGOGASTRODUODENOSCOPY (EGD) WITH PROPOFOL;  Surgeon: Doran Stabler, MD;  Location: [place];  Service: Endoscopy;  Laterality: N/A;  . GASTRECTOMY     laparoscopic sleeve gastrectomy 01/27/15  . Leg Pain    . LUMBAR DISC SURGERY     L4-5 discectomy 05/2017  . OVARIAN CYST REMOVAL     Ruptured    MEDICATIONS: . diphenhydramine-acetaminophen (TYLENOL PM) 25-500 MG TABS tablet  .  HYDROcodone-acetaminophen (NORCO/VICODIN) 5-325 MG tablet  . meloxicam (MOBIC) 15 MG tablet  . pantoprazole (PROTONIX) 40 MG tablet   No current facility-administered medications for this encounter.     Myra Gianotti, PA-C Surgical Short Stay/Anesthesiology [facility] Phone [phone] [facility] Phone [phone] 09/30/2018 5:06 PM

## 2018-09-30 NOTE — Anesthesia Preprocedure Evaluation (Addendum)
Anesthesia Evaluation  Patient identified by MRN, date of birth, ID band Patient awake    Reviewed: Allergy & Precautions, NPO status , Patient's Chart, lab work & pertinent test results  Airway Mallampati: II  TM Distance: >3 FB Neck ROM: Full    Dental no notable dental hx.    Pulmonary neg pulmonary ROS,    Pulmonary exam normal breath sounds clear to auscultation       Cardiovascular + Valvular Problems/Murmurs MR  Rhythm:Regular Rate:Normal + Systolic murmurs    Neuro/Psych negative neurological ROS  negative psych ROS   GI/Hepatic Neg liver ROS, GERD  Medicated,  Endo/Other  negative endocrine ROS  Renal/GU negative Renal ROS  negative genitourinary   Musculoskeletal negative musculoskeletal ROS (+)   Abdominal   Peds negative pediatric ROS (+)  Hematology negative hematology ROS (+)   Anesthesia Other Findings   Reproductive/Obstetrics negative OB ROS                             Anesthesia Physical Anesthesia Plan  ASA: II  Anesthesia Plan: General   Post-op Pain Management:    Induction: Intravenous  PONV Risk Score and Plan: 3 and Ondansetron, Dexamethasone and Treatment may vary due to age or medical condition  Airway Management Planned: Oral ETT  Additional Equipment:   Intra-op Plan:   Post-operative Plan: Extubation in OR  Informed Consent: I have reviewed the patients History and Physical, chart, labs and discussed the procedure including the risks, benefits and alternatives for the proposed anesthesia with the patient or authorized representative who has indicated his/her understanding and acceptance.     Dental advisory given  Plan Discussed with: CRNA and Surgeon  Anesthesia Plan Comments: (PAT note written by Myra Gianotti, PA-C. )       Anesthesia Quick Evaluation

## 2018-10-02 ENCOUNTER — Other Ambulatory Visit: Payer: Self-pay

## 2018-10-02 ENCOUNTER — Encounter (HOSPITAL_COMMUNITY)
Admission: RE | Disposition: A | Discharge status: Home / Self Care | Payer: Self-pay | Source: Home / Self Care | Attending: Neurological Surgery

## 2018-10-02 ENCOUNTER — Inpatient Hospital Stay (HOSPITAL_COMMUNITY): Payer: BC Managed Care – PPO | Admitting: Physician Assistant

## 2018-10-02 ENCOUNTER — Inpatient Hospital Stay (HOSPITAL_COMMUNITY): Payer: BC Managed Care – PPO | Admitting: Anesthesiology

## 2018-10-02 ENCOUNTER — Inpatient Hospital Stay (HOSPITAL_COMMUNITY)
Admission: RE | Admit: 2018-10-02 | Discharge: 2018-10-04 | DRG: 460 | Disposition: A | Discharge status: Home / Self Care | Payer: BC Managed Care – PPO | Attending: Neurological Surgery | Admitting: Neurological Surgery

## 2018-10-02 ENCOUNTER — Encounter (HOSPITAL_COMMUNITY): Payer: Self-pay

## 2018-10-02 ENCOUNTER — Inpatient Hospital Stay (HOSPITAL_COMMUNITY): Payer: BC Managed Care – PPO

## 2018-10-02 DIAGNOSIS — M5416 Radiculopathy, lumbar region: Secondary | ICD-10-CM

## 2018-10-02 DIAGNOSIS — Z888 Allergy status to other drugs, medicaments and biological substances status: Secondary | ICD-10-CM

## 2018-10-02 DIAGNOSIS — F419 Anxiety disorder, unspecified: Secondary | ICD-10-CM | POA: Diagnosis present

## 2018-10-02 DIAGNOSIS — K219 Gastro-esophageal reflux disease without esophagitis: Secondary | ICD-10-CM | POA: Diagnosis present

## 2018-10-02 DIAGNOSIS — M5116 Intervertebral disc disorders with radiculopathy, lumbar region: Principal | ICD-10-CM | POA: Diagnosis present

## 2018-10-02 DIAGNOSIS — Z86718 Personal history of other venous thrombosis and embolism: Secondary | ICD-10-CM

## 2018-10-02 DIAGNOSIS — Z8249 Family history of ischemic heart disease and other diseases of the circulatory system: Secondary | ICD-10-CM

## 2018-10-02 DIAGNOSIS — M5126 Other intervertebral disc displacement, lumbar region: Secondary | ICD-10-CM | POA: Diagnosis present

## 2018-10-02 DIAGNOSIS — Z833 Family history of diabetes mellitus: Secondary | ICD-10-CM

## 2018-10-02 DIAGNOSIS — R110 Nausea: Secondary | ICD-10-CM | POA: Diagnosis not present

## 2018-10-02 DIAGNOSIS — Z803 Family history of malignant neoplasm of breast: Secondary | ICD-10-CM

## 2018-10-02 DIAGNOSIS — R000 Tachycardia, unspecified: Secondary | ICD-10-CM | POA: Diagnosis present

## 2018-10-02 DIAGNOSIS — Z419 Encounter for procedure for purposes other than remedying health state, unspecified: Secondary | ICD-10-CM

## 2018-10-02 DIAGNOSIS — K567 Ileus, unspecified: Secondary | ICD-10-CM | POA: Diagnosis present

## 2018-10-02 DIAGNOSIS — Z885 Allergy status to narcotic agent status: Secondary | ICD-10-CM

## 2018-10-02 DIAGNOSIS — Z79899 Other long term (current) drug therapy: Secondary | ICD-10-CM

## 2018-10-02 HISTORY — PX: ANTERIOR LUMBAR FUSION: SHX1170

## 2018-10-02 HISTORY — PX: ABDOMINAL EXPOSURE: SHX5708

## 2018-10-02 LAB — POCT PREGNANCY, URINE: Preg Test, Ur: NEGATIVE

## 2018-10-02 SURGERY — ANTERIOR LUMBAR FUSION 1 LEVEL
Anesthesia: General

## 2018-10-02 MED ORDER — ROCURONIUM BROMIDE 10 MG/ML (PF) SYRINGE
PREFILLED_SYRINGE | INTRAVENOUS | Status: AC
  Filled 2018-10-02: qty 10

## 2018-10-02 MED ORDER — SODIUM CHLORIDE 0.9% FLUSH: 3.0000 mL | INTRAVENOUS | Status: DC | PRN

## 2018-10-02 MED ORDER — ACETAMINOPHEN 500 MG PO TABS
500.0000 mg | ORAL_TABLET | Freq: Every day | ORAL | Status: DC
  Administered 2018-10-02: 500 mg via ORAL
  Filled 2018-10-02 (×2): qty 1

## 2018-10-02 MED ORDER — LACTATED RINGERS IV SOLN
INTRAVENOUS | Status: DC | PRN
  Administered 2018-10-02: 08:00:00 via INTRAVENOUS

## 2018-10-02 MED ORDER — ACETAMINOPHEN 650 MG RE SUPP: 650.0000 mg | RECTAL | Status: DC | PRN

## 2018-10-02 MED ORDER — PROPOFOL 10 MG/ML IV BOLUS
INTRAVENOUS | Status: AC
  Filled 2018-10-02: qty 20

## 2018-10-02 MED ORDER — ONDANSETRON HCL 4 MG/2ML IJ SOLN: 4.0000 mg | Freq: Four times a day (QID) | INTRAMUSCULAR | Status: DC | PRN

## 2018-10-02 MED ORDER — POLYETHYLENE GLYCOL 3350 17 G PO PACK: 17.0000 g | PACK | Freq: Every day | ORAL | Status: DC | PRN

## 2018-10-02 MED ORDER — FENTANYL CITRATE (PF) 250 MCG/5ML IJ SOLN
INTRAMUSCULAR | Status: DC | PRN
  Administered 2018-10-02: 150 ug via INTRAVENOUS
  Administered 2018-10-02 (×3): 50 ug via INTRAVENOUS
  Administered 2018-10-02: 100 ug via INTRAVENOUS

## 2018-10-02 MED ORDER — FLEET ENEMA 7-19 GM/118ML RE ENEM: 1.0000 | ENEMA | Freq: Once | RECTAL | Status: DC | PRN

## 2018-10-02 MED ORDER — SODIUM CHLORIDE 0.9 % IV SOLN: 250.0000 mL | INTRAVENOUS | Status: DC

## 2018-10-02 MED ORDER — BISACODYL 10 MG RE SUPP: 10.0000 mg | Freq: Every day | RECTAL | Status: DC | PRN

## 2018-10-02 MED ORDER — PHENOL 1.4 % MT LIQD: 1.0000 | OROMUCOSAL | Status: DC | PRN

## 2018-10-02 MED ORDER — THROMBIN 20000 UNITS EX SOLR
CUTANEOUS | Status: AC
  Filled 2018-10-02: qty 20000

## 2018-10-02 MED ORDER — HYDROMORPHONE HCL 1 MG/ML IJ SOLN
INTRAMUSCULAR | Status: AC
  Filled 2018-10-02: qty 0.5

## 2018-10-02 MED ORDER — FENTANYL CITRATE (PF) 250 MCG/5ML IJ SOLN
INTRAMUSCULAR | Status: AC
  Filled 2018-10-02: qty 5

## 2018-10-02 MED ORDER — MENTHOL 3 MG MT LOZG: 1.0000 | LOZENGE | OROMUCOSAL | Status: DC | PRN

## 2018-10-02 MED ORDER — SODIUM CHLORIDE 0.9% FLUSH: 3.0000 mL | Freq: Two times a day (BID) | INTRAVENOUS | Status: DC

## 2018-10-02 MED ORDER — ACETAMINOPHEN 325 MG PO TABS: 650.0000 mg | ORAL_TABLET | ORAL | Status: DC | PRN

## 2018-10-02 MED ORDER — CEFAZOLIN SODIUM-DEXTROSE 2-4 GM/100ML-% IV SOLN
2.0000 g | INTRAVENOUS | Status: AC
  Administered 2018-10-02: 2 g via INTRAVENOUS
  Filled 2018-10-02: qty 100

## 2018-10-02 MED ORDER — PHENYLEPHRINE 40 MCG/ML (10ML) SYRINGE FOR IV PUSH (FOR BLOOD PRESSURE SUPPORT)
PREFILLED_SYRINGE | INTRAVENOUS | Status: DC | PRN
  Administered 2018-10-02 (×3): 60 ug via INTRAVENOUS

## 2018-10-02 MED ORDER — METHOCARBAMOL 500 MG PO TABS
500.0000 mg | ORAL_TABLET | Freq: Four times a day (QID) | ORAL | Status: DC | PRN
  Administered 2018-10-02 – 2018-10-04 (×6): 500 mg via ORAL
  Filled 2018-10-02 (×6): qty 1

## 2018-10-02 MED ORDER — DIPHENHYDRAMINE HCL 50 MG/ML IJ SOLN
INTRAMUSCULAR | Status: AC
  Filled 2018-10-02: qty 1

## 2018-10-02 MED ORDER — MIDAZOLAM HCL 5 MG/5ML IJ SOLN
INTRAMUSCULAR | Status: DC | PRN
  Administered 2018-10-02: 2 mg via INTRAVENOUS

## 2018-10-02 MED ORDER — ONDANSETRON HCL 4 MG/2ML IJ SOLN
INTRAMUSCULAR | Status: DC | PRN
  Administered 2018-10-02: 4 mg via INTRAVENOUS

## 2018-10-02 MED ORDER — DOCUSATE SODIUM 100 MG PO CAPS: 100.0000 mg | ORAL_CAPSULE | Freq: Two times a day (BID) | ORAL | Status: DC

## 2018-10-02 MED ORDER — PHENYLEPHRINE 40 MCG/ML (10ML) SYRINGE FOR IV PUSH (FOR BLOOD PRESSURE SUPPORT)
PREFILLED_SYRINGE | INTRAVENOUS | Status: AC
  Filled 2018-10-02: qty 10

## 2018-10-02 MED ORDER — CHLORHEXIDINE GLUCONATE CLOTH 2 % EX PADS: 6.0000 | MEDICATED_PAD | Freq: Once | CUTANEOUS | Status: DC

## 2018-10-02 MED ORDER — DOCUSATE SODIUM 100 MG PO CAPS
100.0000 mg | ORAL_CAPSULE | Freq: Two times a day (BID) | ORAL | Status: DC
  Administered 2018-10-02 – 2018-10-03 (×3): 100 mg via ORAL
  Filled 2018-10-02 (×3): qty 1

## 2018-10-02 MED ORDER — 0.9 % SODIUM CHLORIDE (POUR BTL) OPTIME
TOPICAL | Status: DC | PRN
  Administered 2018-10-02: 1000 mL

## 2018-10-02 MED ORDER — LACTATED RINGERS IV SOLN
INTRAVENOUS | Status: DC | PRN
  Administered 2018-10-02: 07:00:00 via INTRAVENOUS

## 2018-10-02 MED ORDER — CEFAZOLIN SODIUM-DEXTROSE 2-4 GM/100ML-% IV SOLN
2.0000 g | Freq: Three times a day (TID) | INTRAVENOUS | Status: AC
  Administered 2018-10-02 (×2): 2 g via INTRAVENOUS
  Filled 2018-10-02 (×2): qty 100

## 2018-10-02 MED ORDER — BUPIVACAINE HCL (PF) 0.5 % IJ SOLN
INTRAMUSCULAR | Status: DC | PRN
  Administered 2018-10-02: 6 mL

## 2018-10-02 MED ORDER — SENNA 8.6 MG PO TABS: 1.0000 | ORAL_TABLET | Freq: Two times a day (BID) | ORAL | Status: DC

## 2018-10-02 MED ORDER — SUGAMMADEX SODIUM 200 MG/2ML IV SOLN
INTRAVENOUS | Status: DC | PRN
  Administered 2018-10-02: 200 mg via INTRAVENOUS

## 2018-10-02 MED ORDER — BUPIVACAINE HCL (PF) 0.5 % IJ SOLN
INTRAMUSCULAR | Status: AC
  Filled 2018-10-02: qty 30

## 2018-10-02 MED ORDER — METHOCARBAMOL 1000 MG/10ML IJ SOLN
500.0000 mg | Freq: Four times a day (QID) | INTRAVENOUS | Status: DC | PRN
  Filled 2018-10-02: qty 5

## 2018-10-02 MED ORDER — DEXAMETHASONE SODIUM PHOSPHATE 10 MG/ML IJ SOLN
INTRAMUSCULAR | Status: AC
  Filled 2018-10-02: qty 1

## 2018-10-02 MED ORDER — PROMETHAZINE HCL 25 MG/ML IJ SOLN: 6.2500 mg | INTRAMUSCULAR | Status: DC | PRN

## 2018-10-02 MED ORDER — ALUM & MAG HYDROXIDE-SIMETH 200-200-20 MG/5ML PO SUSP: 30.0000 mL | Freq: Four times a day (QID) | ORAL | Status: DC | PRN

## 2018-10-02 MED ORDER — SODIUM CHLORIDE 0.9% FLUSH
3.0000 mL | Freq: Two times a day (BID) | INTRAVENOUS | Status: DC
  Administered 2018-10-02: 3 mL via INTRAVENOUS

## 2018-10-02 MED ORDER — SENNA 8.6 MG PO TABS
1.0000 | ORAL_TABLET | Freq: Two times a day (BID) | ORAL | Status: DC
  Administered 2018-10-02 – 2018-10-03 (×3): 8.6 mg via ORAL
  Filled 2018-10-02 (×3): qty 1

## 2018-10-02 MED ORDER — DIPHENHYDRAMINE HCL 50 MG/ML IJ SOLN
12.5000 mg | Freq: Once | INTRAMUSCULAR | Status: AC
  Administered 2018-10-02: 12.5 mg via INTRAVENOUS

## 2018-10-02 MED ORDER — ONDANSETRON HCL 4 MG/2ML IJ SOLN
INTRAMUSCULAR | Status: AC
  Filled 2018-10-02: qty 2

## 2018-10-02 MED ORDER — ONDANSETRON HCL 4 MG/2ML IJ SOLN
4.0000 mg | Freq: Four times a day (QID) | INTRAMUSCULAR | Status: DC | PRN
  Administered 2018-10-02 – 2018-10-03 (×2): 4 mg via INTRAVENOUS
  Filled 2018-10-02 (×2): qty 2

## 2018-10-02 MED ORDER — HYDROMORPHONE HCL 1 MG/ML IJ SOLN
0.5000 mg | INTRAMUSCULAR | Status: DC | PRN
  Administered 2018-10-02: 1 mg via INTRAVENOUS
  Filled 2018-10-02: qty 1

## 2018-10-02 MED ORDER — HYDROMORPHONE HCL 1 MG/ML IJ SOLN
INTRAMUSCULAR | Status: DC | PRN
  Administered 2018-10-02: 0.5 mg via INTRAVENOUS

## 2018-10-02 MED ORDER — DIPHENHYDRAMINE HCL 25 MG PO CAPS
25.0000 mg | ORAL_CAPSULE | Freq: Every day | ORAL | Status: DC
  Administered 2018-10-02 – 2018-10-03 (×2): 25 mg via ORAL
  Filled 2018-10-02 (×2): qty 1

## 2018-10-02 MED ORDER — DEXMEDETOMIDINE HCL 200 MCG/2ML IV SOLN
INTRAVENOUS | Status: DC | PRN
  Administered 2018-10-02: 8 ug via INTRAVENOUS

## 2018-10-02 MED ORDER — HYDROMORPHONE HCL 1 MG/ML IJ SOLN
INTRAMUSCULAR | Status: AC
  Filled 2018-10-02: qty 1

## 2018-10-02 MED ORDER — DIPHENHYDRAMINE-APAP (SLEEP) 25-500 MG PO TABS: 1.0000 | ORAL_TABLET | Freq: Every day | ORAL | Status: DC

## 2018-10-02 MED ORDER — ONDANSETRON HCL 4 MG PO TABS: 4.0000 mg | ORAL_TABLET | Freq: Four times a day (QID) | ORAL | Status: DC | PRN

## 2018-10-02 MED ORDER — KETOROLAC TROMETHAMINE 30 MG/ML IJ SOLN
30.0000 mg | Freq: Once | INTRAMUSCULAR | Status: AC | PRN
  Administered 2018-10-02: 30 mg via INTRAVENOUS

## 2018-10-02 MED ORDER — PROPOFOL 10 MG/ML IV BOLUS
INTRAVENOUS | Status: DC | PRN
  Administered 2018-10-02: 150 mg via INTRAVENOUS

## 2018-10-02 MED ORDER — LIDOCAINE 2% (20 MG/ML) 5 ML SYRINGE
INTRAMUSCULAR | Status: DC | PRN
  Administered 2018-10-02: 20 mg via INTRAVENOUS
  Administered 2018-10-02: 80 mg via INTRAVENOUS

## 2018-10-02 MED ORDER — ROCURONIUM BROMIDE 10 MG/ML (PF) SYRINGE
PREFILLED_SYRINGE | INTRAVENOUS | Status: DC | PRN
  Administered 2018-10-02: 80 mg via INTRAVENOUS

## 2018-10-02 MED ORDER — HYDROCODONE-ACETAMINOPHEN 5-325 MG PO TABS
1.0000 | ORAL_TABLET | ORAL | Status: DC | PRN
  Administered 2018-10-02 – 2018-10-04 (×11): 2 via ORAL
  Filled 2018-10-02 (×11): qty 2

## 2018-10-02 MED ORDER — HYDROMORPHONE HCL 1 MG/ML IJ SOLN
0.2500 mg | INTRAMUSCULAR | Status: DC | PRN
  Administered 2018-10-02 (×4): 0.5 mg via INTRAVENOUS

## 2018-10-02 MED ORDER — DEXAMETHASONE SODIUM PHOSPHATE 10 MG/ML IJ SOLN
INTRAMUSCULAR | Status: DC | PRN
  Administered 2018-10-02: 10 mg via INTRAVENOUS

## 2018-10-02 MED ORDER — CEFAZOLIN SODIUM-DEXTROSE 2-4 GM/100ML-% IV SOLN: 2.0000 g | Freq: Three times a day (TID) | INTRAVENOUS | Status: DC

## 2018-10-02 MED ORDER — KETOROLAC TROMETHAMINE 30 MG/ML IJ SOLN
INTRAMUSCULAR | Status: AC
  Filled 2018-10-02: qty 1

## 2018-10-02 MED ORDER — DIPHENHYDRAMINE HCL 50 MG/ML IJ SOLN: 12.5000 mg | Freq: Once | INTRAMUSCULAR | Status: DC

## 2018-10-02 MED ORDER — LIDOCAINE-EPINEPHRINE 1 %-1:100000 IJ SOLN
INTRAMUSCULAR | Status: DC | PRN
  Administered 2018-10-02: 6 mL

## 2018-10-02 MED ORDER — LIDOCAINE 2% (20 MG/ML) 5 ML SYRINGE
INTRAMUSCULAR | Status: AC
  Filled 2018-10-02: qty 5

## 2018-10-02 MED ORDER — THROMBIN 20000 UNITS EX KIT
PACK | CUTANEOUS | Status: AC
  Filled 2018-10-02: qty 1

## 2018-10-02 MED ORDER — KETOROLAC TROMETHAMINE 15 MG/ML IJ SOLN
7.5000 mg | Freq: Four times a day (QID) | INTRAMUSCULAR | Status: AC
  Administered 2018-10-02 – 2018-10-03 (×3): 7.5 mg via INTRAVENOUS
  Filled 2018-10-02 (×3): qty 1

## 2018-10-02 MED ORDER — THROMBIN 5000 UNITS EX SOLR
OROMUCOSAL | Status: DC | PRN
  Administered 2018-10-02: 5 mL via TOPICAL

## 2018-10-02 MED ORDER — THROMBIN 5000 UNITS EX SOLR
CUTANEOUS | Status: AC
  Filled 2018-10-02: qty 5000

## 2018-10-02 MED ORDER — SODIUM CHLORIDE 0.9 % IV SOLN
INTRAVENOUS | Status: DC | PRN
  Administered 2018-10-02: 500 mL

## 2018-10-02 MED ORDER — MIDAZOLAM HCL 2 MG/2ML IJ SOLN
INTRAMUSCULAR | Status: AC
  Filled 2018-10-02: qty 2

## 2018-10-02 MED ORDER — PANTOPRAZOLE SODIUM 40 MG PO TBEC
40.0000 mg | DELAYED_RELEASE_TABLET | Freq: Every day | ORAL | Status: DC
  Administered 2018-10-03 – 2018-10-04 (×2): 40 mg via ORAL
  Filled 2018-10-02 (×2): qty 1

## 2018-10-02 MED ORDER — LIDOCAINE-EPINEPHRINE 1 %-1:100000 IJ SOLN
INTRAMUSCULAR | Status: AC
  Filled 2018-10-02: qty 1

## 2018-10-02 MED ORDER — DEXMEDETOMIDINE HCL IN NACL 200 MCG/50ML IV SOLN
INTRAVENOUS | Status: AC
  Filled 2018-10-02: qty 50

## 2018-10-02 SURGICAL SUPPLY — 83 items
APPLIER CLIP 11 MED OPEN (CLIP) ×2
BAG DECANTER FOR FLEXI CONT (MISCELLANEOUS) ×2 IMPLANT
BASKET BONE COLLECTION (BASKET) IMPLANT
BOLT BASE TI 5X17.5 VARIABLE (Bolt) ×6 IMPLANT
BONE MATRIX OSTEOCEL PRO MED (Bone Implant) ×2 IMPLANT
BUR BARREL STRAIGHT FLUTE 4.0 (BURR) ×2 IMPLANT
BUR MATCHSTICK NEURO 3.0 LAGG (BURR) ×2 IMPLANT
CAGE FUSION TI 8X34X24 10D (Cage) ×2 IMPLANT
CANISTER SUCT 3000ML PPV (MISCELLANEOUS) ×2 IMPLANT
CLIP APPLIE 11 MED OPEN (CLIP) ×1 IMPLANT
CLIP LIGATING EXTRA MED SLVR (CLIP) IMPLANT
CLIP LIGATING EXTRA SM BLUE (MISCELLANEOUS) IMPLANT
COVER BACK TABLE 60X90IN (DRAPES) ×2 IMPLANT
COVER WAND RF STERILE (DRAPES) ×2 IMPLANT
DECANTER SPIKE VIAL GLASS SM (MISCELLANEOUS) ×2 IMPLANT
DERMABOND ADVANCED (GAUZE/BANDAGES/DRESSINGS) ×1
DERMABOND ADVANCED .7 DNX12 (GAUZE/BANDAGES/DRESSINGS) ×1 IMPLANT
DRAPE C-ARM 42X72 X-RAY (DRAPES) ×2 IMPLANT
DRAPE C-ARMOR (DRAPES) ×2 IMPLANT
DRAPE LAPAROTOMY 100X72X124 (DRAPES) ×2 IMPLANT
DURAPREP 26ML APPLICATOR (WOUND CARE) ×2 IMPLANT
ELECT BLADE 4.0 EZ CLEAN MEGAD (MISCELLANEOUS) ×2
ELECT REM PT RETURN 9FT ADLT (ELECTROSURGICAL) ×2
ELECTRODE BLDE 4.0 EZ CLN MEGD (MISCELLANEOUS) ×1 IMPLANT
ELECTRODE REM PT RTRN 9FT ADLT (ELECTROSURGICAL) ×1 IMPLANT
GAUZE 4X4 16PLY RFD (DISPOSABLE) IMPLANT
GLOVE BIOGEL PI IND STRL 7.0 (GLOVE) ×2 IMPLANT
GLOVE BIOGEL PI IND STRL 7.5 (GLOVE) IMPLANT
GLOVE BIOGEL PI IND STRL 8.5 (GLOVE) ×1 IMPLANT
GLOVE BIOGEL PI INDICATOR 7.0 (GLOVE) ×2
GLOVE BIOGEL PI INDICATOR 7.5 (GLOVE)
GLOVE BIOGEL PI INDICATOR 8.5 (GLOVE) ×1
GLOVE ECLIPSE 8.5 STRL (GLOVE) ×2 IMPLANT
GLOVE EXAM NITRILE XL STR (GLOVE) IMPLANT
GLOVE INDICATOR 7.0 STRL GRN (GLOVE) IMPLANT
GLOVE SS BIOGEL STRL SZ 7.5 (GLOVE) ×1 IMPLANT
GLOVE SUPERSENSE BIOGEL SZ 7.5 (GLOVE) ×1
GLOVE SURG SS PI 7.5 STRL IVOR (GLOVE) ×6 IMPLANT
GOWN STRL REUS W/ TWL LRG LVL3 (GOWN DISPOSABLE) ×2 IMPLANT
GOWN STRL REUS W/ TWL XL LVL3 (GOWN DISPOSABLE) ×1 IMPLANT
GOWN STRL REUS W/TWL 2XL LVL3 (GOWN DISPOSABLE) ×2 IMPLANT
GOWN STRL REUS W/TWL LRG LVL3 (GOWN DISPOSABLE) ×2
GOWN STRL REUS W/TWL XL LVL3 (GOWN DISPOSABLE) ×1
HEMOSTAT POWDER KIT SURGIFOAM (HEMOSTASIS) ×2 IMPLANT
INSERT FOGARTY 61MM (MISCELLANEOUS) IMPLANT
INSERT FOGARTY SM (MISCELLANEOUS) IMPLANT
KIT BASIN OR (CUSTOM PROCEDURE TRAY) ×2 IMPLANT
KIT TURNOVER KIT B (KITS) ×2 IMPLANT
LOOP VESSEL MAXI BLUE (MISCELLANEOUS) IMPLANT
LOOP VESSEL MINI RED (MISCELLANEOUS) IMPLANT
NEEDLE HYPO 25X1 1.5 SAFETY (NEEDLE) IMPLANT
NEEDLE SPNL 18GX3.5 QUINCKE PK (NEEDLE) IMPLANT
NS IRRIG 1000ML POUR BTL (IV SOLUTION) ×2 IMPLANT
PACK LAMINECTOMY NEURO (CUSTOM PROCEDURE TRAY) ×2 IMPLANT
PAD ARMBOARD 7.5X6 YLW CONV (MISCELLANEOUS) ×4 IMPLANT
SPONGE INTESTINAL PEANUT (DISPOSABLE) ×4 IMPLANT
SPONGE LAP 18X18 RF (DISPOSABLE) ×4 IMPLANT
SPONGE LAP 4X18 RFD (DISPOSABLE) IMPLANT
SPONGE SURGIFOAM ABS GEL 100 (HEMOSTASIS) IMPLANT
STAPLER VISISTAT 35W (STAPLE) IMPLANT
SUT PDS AB 1 CT  36 (SUTURE) ×2
SUT PDS AB 1 CT 36 (SUTURE) ×2 IMPLANT
SUT PROLENE 4 0 RB 1 (SUTURE)
SUT PROLENE 4-0 RB1 .5 CRCL 36 (SUTURE) IMPLANT
SUT PROLENE 5 0 CC1 (SUTURE) IMPLANT
SUT PROLENE 6 0 C 1 30 (SUTURE) IMPLANT
SUT PROLENE 6 0 CC (SUTURE) IMPLANT
SUT SILK 0 TIES 10X30 (SUTURE) ×2 IMPLANT
SUT SILK 2 0 TIES 10X30 (SUTURE) ×4 IMPLANT
SUT SILK 2 0SH CR/8 30 (SUTURE) IMPLANT
SUT SILK 3 0 TIES 10X30 (SUTURE) IMPLANT
SUT SILK 3 0SH CR/8 30 (SUTURE) IMPLANT
SUT VIC AB 0 CT1 27 (SUTURE)
SUT VIC AB 0 CT1 27XBRD ANBCTR (SUTURE) IMPLANT
SUT VIC AB 1 CT1 18XBRD ANBCTR (SUTURE) ×1 IMPLANT
SUT VIC AB 1 CT1 8-18 (SUTURE) ×1
SUT VIC AB 2-0 CP2 18 (SUTURE) ×2 IMPLANT
SUT VIC AB 3-0 SH 8-18 (SUTURE) ×4 IMPLANT
SUT VICRYL 4-0 PS2 18IN ABS (SUTURE) IMPLANT
TOWEL GREEN STERILE (TOWEL DISPOSABLE) ×4 IMPLANT
TOWEL GREEN STERILE FF (TOWEL DISPOSABLE) ×6 IMPLANT
TRAY FOLEY MTR SLVR 16FR STAT (SET/KITS/TRAYS/PACK) ×2 IMPLANT
WATER STERILE IRR 1000ML POUR (IV SOLUTION) ×2 IMPLANT

## 2018-10-02 NOTE — Anesthesia Procedure Notes (Signed)
Procedure Name: Intubation Date/Time: 10/02/2018 7:50 AM Performed by: Mariea Clonts, CRNA Pre-anesthesia Checklist: Patient identified, Emergency Drugs available, Suction available and Patient being monitored Patient Re-evaluated:Patient Re-evaluated prior to induction Oxygen Delivery Method: Circle System Utilized Preoxygenation: Pre-oxygenation with 100% oxygen Induction Type: IV induction Ventilation: Mask ventilation without difficulty Laryngoscope Size: Mac and 3 Grade View: Grade II Tube type: Oral Tube size: 7.0 mm Number of attempts: 1 Airway Equipment and Method: Stylet and Oral airway Placement Confirmation: ETT inserted through vocal cords under direct vision,  positive ETCO2 and breath sounds checked- equal and bilateral Tube secured with: Tape Dental Injury: Teeth and Oropharynx as per pre-operative assessment

## 2018-10-02 NOTE — Transfer of Care (Signed)
Immediate Anesthesia Transfer of Care Note  Patient: Laura Shah  Procedure(s) Performed: Lumbar four-five Anterior lumbar interbody (N/A ) ABDOMINAL EXPOSURE (N/A )  Patient Location: PACU  Anesthesia Type:General  Level of Consciousness: awake, alert  and oriented  Airway & Oxygen Therapy: Patient Spontanous Breathing and Patient connected to nasal cannula oxygen  Post-op Assessment: Report given to RN, Post -op Vital signs reviewed and stable and Patient moving all extremities X 4  Post vital signs: Reviewed and stable  Last Vitals:  Vitals Value Taken Time  BP 122/79 10/02/18 1046  Temp    Pulse 88 10/02/18 1049  Resp 23 10/02/18 1049  SpO2 100 % 10/02/18 1049  Vitals shown include unvalidated device data.  Last Pain:  Vitals:   10/02/18 0636  PainSc: 7          Complications: No apparent anesthesia complications

## 2018-10-02 NOTE — H&P (Signed)
Laura Shah is an 38 y.o. female.   Chief Complaint: Back and right leg pain recurrent disc herniation for L5 HPI: Patient is a 38 year old individual whose had significant history of a herniated nucleus pulposus at L4-L5 on the right.  She got better for a period of time but then soon she started to recur pain that got progressively worse.  Recent MRIs demonstrate that there is a significant recurrence of a disc herniation at L4-L5 on the right.  Because the disc is showing signs of advanced degeneration I have advised that she should be decompressed using an anterior lumbar interbody arthrodesis technique.  She is now admitted for this procedure.  Past Medical History:  Diagnosis Date  . Anemia   . Anxiety   . Back pain   . GERD (gastroesophageal reflux disease)   . Heart valve regurgitation    trivial PR, MR, TR 2014 echo [facility])  . 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 Ileus (HCC)   . Interstitial cystitis   . Lumbar herniated disc 04/17/2017   L4/L5. Ruptured.  . Pancreatitis   . Right leg weakness    and numbness   . Tachycardia     Past Surgical History:  Procedure Laterality Date  . APPENDECTOMY    . CESAREAN SECTION    . CHOLECYSTECTOMY    . ESOPHAGOGASTRODUODENOSCOPY (EGD) WITH PROPOFOL N/A 03/18/2015   Procedure: ESOPHAGOGASTRODUODENOSCOPY (EGD) WITH PROPOFOL;  Surgeon: Sherrilyn RistHenry L Danis III, MD;  [facility] ENDOSCOPY;  Service: Endoscopy;  Laterality: N/A;  . GASTRECTOMY     laparoscopic sleeve gastrectomy 01/27/15  . Leg Pain    . LUMBAR DISC SURGERY     L4-5 discectomy 05/2017  . OVARIAN CYST REMOVAL     Ruptured    Family History  Problem Relation Age of Onset  . Breast cancer Maternal Grandmother   . Breast cancer Paternal Grandmother   . Diabetes Paternal Grandmother   . Diabetes Father   . Hypertension Father    Social History:  reports that she has never smoked. She has never used smokeless  tobacco. She reports that she does not drink alcohol or use drugs.  Allergies:  Allergies  Allergen Reactions  . Reglan [Metoclopramide] Anxiety  . Morphine And Related Itching    Medications Prior to Admission  Medication Sig Dispense Refill  . HYDROcodone-acetaminophen (NORCO/VICODIN) 5-325 MG tablet Take 1-2 tablets by mouth every 4 (four) hours as needed. (Patient taking differently: Take 1 tablet by mouth every 6 (six) hours as needed (pain). ) 10 tablet 0  . meloxicam (MOBIC) 15 MG tablet Take 15 mg by mouth daily.    . pantoprazole (PROTONIX) 40 MG tablet Take 1 tablet (40 mg total) by mouth daily. (Patient taking differently: Take 40 mg by mouth daily before breakfast. ) 30 tablet 0  . diphenhydramine-acetaminophen (TYLENOL PM) 25-500 MG TABS tablet Take 1 tablet by mouth at bedtime.      Results for orders placed or performed during the hospital encounter of 10/02/18 (from the past 48 hour(s))  Pregnancy, urine POC     Status: None   Collection Time: 10/02/18  6:45 AM  Result Value Ref Range   Preg Test, Ur NEGATIVE NEGATIVE    Comment:        THE SENSITIVITY OF THIS METHODOLOGY IS >24 mIU/mL    No results found.  Review of Systems  Constitutional: Negative.   HENT: Negative.  Eyes: Negative.   Respiratory: Negative.   Cardiovascular: Negative.   Gastrointestinal: Negative.   Genitourinary: Negative.   Musculoskeletal: Positive for back pain.  Skin: Negative.   Neurological: Positive for sensory change and focal weakness.  Endo/Heme/Allergies: Negative.   Psychiatric/Behavioral:       Anxiety    Blood pressure 138/79, pulse 82, temperature 98.3 F (36.8 C), last menstrual period 09/08/2018, SpO2 100 %. Physical Exam  Constitutional: She is oriented to person, place, and time. She appears well-developed and well-nourished.  HENT:  Head: Normocephalic and atraumatic.  Eyes: Pupils are equal, round, and reactive to light. Conjunctivae and EOM are normal.   Neck: Normal range of motion. Neck supple.  Cardiovascular: Normal rate and regular rhythm.  Respiratory: Effort normal and breath sounds normal.  GI: Soft. Bowel sounds are normal.  Musculoskeletal:     Comments: Positive straight leg raising bilaterally at 30 degrees  Neurological: She is alert and oriented to person, place, and time. She has normal reflexes.  Mild weakness on the right tibialis anterior graded at 4- out of 5  Skin: Skin is warm and dry.  Psychiatric: She has a normal mood and affect. Her behavior is normal. Judgment and thought content normal.     Assessment/Plan Recurrent herniated nucleus pulposus L4-L5 right.  Plan: Anterior lumbar interbody arthrodesis L4-L5.  Earleen Newport, MD 10/02/2018, 7:36 AM

## 2018-10-02 NOTE — Op Note (Signed)
    OPERATIVE REPORT  DATE OF SURGERY: 10/02/2018  PATIENT: Laura Shah, 38 y.o. female MRN: [id number]  DOB: 05/17/1980  PRE-OPERATIVE DIAGNOSIS: Degenerative disc disease  POST-OPERATIVE DIAGNOSIS:  Same  PROCEDURE: Anterior exposure for L4-5 interbody fusion  SURGEON:  Curt Jews, M.D.  Co-surgeon for the exposure Dr. Kristeen Miss  ANESTHESIA: General  EBL: per anesthesia record  Total I/O In: 1200 [I.V.:1200] Out: 120 [Urine:120]  BLOOD ADMINISTERED: none  DRAINS: none  SPECIMEN: none  COUNTS CORRECT:  YES  PATIENT DISPOSITION:  PACU - hemodynamically stable  PROCEDURE DETAILS: Patient was taken the operating placed supine position where the area of the abdomen was prepped and draped you sterile fashion.  Crosstable lateral image of the abdomen were used to identify the level of the 4 5 disc.  An incision was made from the midline to the left and carried down through the subcutaneous fat with electrocautery.  The anterior rectus sheath was opened in line with the skin incision.  The rectus muscle was mobilized circumferentially.  The retroperitoneal space was entered bluntly in the left lower quadrant.  Blunt dissection was used to continue to mobilize the intraperitoneal contents to the right.  The posterior rectus sheath was opened laterally with Metzenbaum scissors.  The left ureter was mobilized to the right.  The iliac vessels were mobilized to the right off the L4-5 disc.  The patient had multiple iliolumbar veins and in a Barrett artery in this same region.  The 4 separate iliolumbar veins were ligated individually with 3-0 silk ties.  The a Lysle Rubens artery at this location was also ligated with 3-0 silk ties and divided.  Blunt dissection then was continued to mobilize the vascular structures to the right.  The Thompson retractor was brought onto the field and the reverse lip 150 blades were positioned to the right of the L 4 5 disc.  Malleable retractors were used  for superior and inferior and left lateral retraction.  Spinal needle was placed in the disc C arm was brought onto the field to confirm that this was the L4-5 disc space.  The remainder the procedure will be dictated as a separate note by Dr. Magda Kiel, M.D., [facility] 10/02/2018 10:00 AM

## 2018-10-02 NOTE — Progress Notes (Signed)
Orthopedic Tech Progress Note Patient Details:  Laura Shah July 01, 1980 [id number] Patient has brace Patient ID: Laura Shah, female   DOB: 08/01/1980, 38 y.o.   MRN: [id number]   Laura Shah 10/02/2018, 1:20 PM

## 2018-10-02 NOTE — Evaluation (Signed)
Physical Therapy Evaluation Patient Details Name: Laura Shah MRN: [id number] DOB: 1980/06/13 Today's Date: 10/02/2018   History of Present Illness  Pt is a 38 y.o. F with significant PMH of prior discectomy (2019) and blood clots who presents s/p anterior exposure for L4-5 interbody fusion.  Clinical Impression  Patient is s/p above surgery resulting in the deficits listed below (see PT Problem List). Pt presents with decreased functional mobility secondary to expected post surgical pain, weakness (particularly right ankle dorsiflexors and plantarflexors), balance impairments, decreased activity tolerance. Pt ambulating 50 feet at min guard assist level before requesting to sit down, stating, "I feel like my knees are going to give out." No knee buckle noted. Feel that pt will progress well tomorrow. Patient will benefit from skilled PT to increase their independence and safety with mobility (while adhering to their precautions) to allow discharge to the venue listed below.     Follow Up Recommendations No PT follow up;Supervision for mobility/OOB    Equipment Recommendations  3in1 (PT)    Recommendations for Other Services       Precautions / Restrictions Precautions Precautions: Fall;Back Precaution Booklet Issued: Yes (comment) Precaution Comments: Verbally reviewed, provided written handout Required Braces or Orthoses: Spinal Brace Spinal Brace: Lumbar corset;Applied in sitting position Restrictions Weight Bearing Restrictions: No      Mobility  Bed Mobility Overal bed mobility: Needs Assistance Bed Mobility: Rolling;Sidelying to Sit;Sit to Sidelying Rolling: Min assist Sidelying to sit: Min assist     Sit to sidelying: Min assist General bed mobility comments: MinA for rolling towards right, light assist at trunk to promote upright. MinA for negotiation of legs back into bed  Transfers Overall transfer level: Needs assistance Equipment used: None Transfers: Sit  to/from Stand Sit to Stand: Min guard         General transfer comment: Slow to rise  Ambulation/Gait Ambulation/Gait assistance: Min guard Gait Distance (Feet): 50 Feet Assistive device: None Gait Pattern/deviations: Step-through pattern;Decreased stride length;Decreased step length - left Gait velocity: decreased   General Gait Details: Pt with slow speed, decreased L step length. Tending to seek single UE external support.   Stairs            Wheelchair Mobility    Modified Rankin (Stroke Patients Only)       Balance Overall balance assessment: Needs assistance Sitting-balance support: Feet supported Sitting balance-[name]     Standing balance support: No upper extremity supported;During functional activity Standing balance-[name]                               Pertinent Vitals/Pain Pain Assessment: Faces Faces Pain Scale: Hurts even more Pain Location: back Pain Descriptors / Indicators: Grimacing;Guarding Pain Intervention(s): Limited activity within patient's tolerance;Monitored during session;Premedicated before session    Home Living Family/patient expects to be discharged to:: Private residence Living Arrangements: Other (Comment)(boyfriend) Available Help at Discharge: Other (Comment)(significant other) Type of Home: House Home Access: Stairs to enter Entrance Stairs-Rails: None Entrance Stairs-Number of Steps: 4 Home Layout: One level Home Equipment: Walker - 2 wheels;Crutches      Prior Function Level of Independence: Independent         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     Hand Dominance        Extremity/Trunk Assessment   Upper Extremity Assessment Upper Extremity Assessment: Overall WFL for tasks assessed    Lower Extremity Assessment Lower Extremity Assessment: RLE  deficits/detail;LLE deficits/detail RLE Deficits / Details: Knee extension 3+/5, ankle dorsiflexion/plantaflexion 1/5 LLE  Deficits / Details: Knee extension 3+/5, ankle dorsiflexion/plantaflexion 2/5    Cervical / Trunk Assessment Cervical / Trunk Assessment: Other exceptions Cervical / Trunk Exceptions: s/p lumbar fusion  Communication   Communication: No difficulties  Cognition Arousal/Alertness: Awake/alert Behavior During Therapy: WFL for tasks assessed/performed Overall Cognitive Status: Within Functional Limits for tasks assessed                                        General Comments      Exercises     Assessment/Plan    PT Assessment Patient needs continued PT services  PT Problem List Decreased strength;Decreased activity tolerance;Decreased balance;Decreased mobility;Pain;Decreased knowledge of precautions       PT Treatment Interventions DME instruction;Gait training;Stair training;Functional mobility training;Therapeutic activities;Balance training;Therapeutic exercise;Patient/family education    PT Goals (Current goals can be found in the Care Plan section)  Acute Rehab PT Goals Patient 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: "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." PT Goal Formulation: With patient Time For Goal Achievement: 10/16/18 Potential to Achieve Goals: Good    Frequency Min 5X/week   Barriers to discharge        Co-evaluation               AM-PAC PT "6 Clicks" Mobility  Outcome Measure Help needed turning from your back to your side while in a flat bed without using bedrails?: A Little Help needed moving from lying on your back to sitting on the side of a flat bed without using bedrails?: A Little Help needed moving to and from a bed to a chair (including a wheelchair)?: A Little Help needed standing up from a chair using your arms (e.g., wheelchair or bedside chair)?: A Little Help needed to walk in hospital room?: A Little Help needed climbing 3-5 steps with a railing? : A Little 6 Click Score: 18    End of Session Equipment Utilized During Treatment: Back brace Activity Tolerance:  Patient limited by pain Patient left: in bed;with call bell/phone within reach;with family/visitor present Nurse Communication: Mobility status PT Visit Diagnosis: Pain;Difficulty in walking, not elsewhere classified (R26.2) Pain - part of body: (back)    Time: 1600-1630 PT Time Calculation (min) (ACUTE ONLY): 30 min   Charges:   PT Evaluation $PT Eval Low Complexity: 1 Low PT Treatments $Therapeutic Activity: 8-22 mins        Laurina Bustlearoline Harlene Petralia, PT, DPT Acute Rehabilitation Services Pager [phone] Office [phone]   Vanetta MuldersCarloine H Helon Wisinski 10/02/2018, 5:19 PM

## 2018-10-02 NOTE — Progress Notes (Signed)
Patient ID: Laura Shah, female   DOB: 10-Aug-1980, 38 y.o.   MRN: [id number] Vital signs are stable. Patient is awake alert feels reasonably comfortable Some abdominal discomfort Legs feel okay

## 2018-10-02 NOTE — Op Note (Signed)
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 Surgeon: Kristeen Miss Approach: Dr. Sherren Mocha Laura Shah Anesthesia: General endotracheal Indications: Laura Shah is a 38 year old female who is had chronic lumbar radiculopathy she had a herniated nucleus pulposus a couple of years ago and after undergoing surgical resection had a brief respite from pain but then gradually recurred significant right lumbar pain follow-up MRI demonstrates presence of recurrent herniated nucleus pulposus at L4-L5.  She has been advised regarding the need for surgical decompression and stabilization of the L4-5 joint as there is been progressive degeneration of that joint.  She is now admitted for surgery.  Procedure: The patient was brought to the operating room supine on the stretcher.  After the smooth induction of general endotracheal anesthesia fluoroscopic guidance was used to mark the area for the L4-5 disc space on the ventral aspect of the skin.  The area was prepped with alcohol DuraPrep and draped in a sterile fashion.  Dr. early started by aching a transverse incision is in this region and dissecting the retroperitoneal space to expose the anterior version of the L4-5 space.  Once the retractors were in place I opened the anterior longitudinal ligament with a #15 blade and then using a Cobb dissector carefully dissected the disc space to remove the vast portions of the L4-5 disc.  As the region of the posterior longitudinal ligament was reached there was noted to be significant scar tissue particularly on the right side this was carefully teased away and the edges of the endplate were explored.  Underneath this some fragments of disc were discovered and these were removed.  Ultimately this allowed for  good decompression of the ventral aspect of the disc space and the path of the L4 nerve root superiorly and the L5 nerve root inferiorly.  The endplates were then carefully curettaged to remove all remnants of cartilaginous attachment.  Once this was performed then the dissection was carried out to the opposite side in a similar decompression was obtained.  Hemostasis was checked in the soft tissues and the epidural veins.  The interspace was then sized for an appropriate size spacer and it was felt that ultimately an 8 mm tall 34 x 24 mm spacer with 10 degrees of lordosis would fit best after checking radiographic Truman Hayward and confirming its fit the spacer was filled with ostia cell and ostia cell was placed in the lateral gutters free of the implant.  In addition then three 5 x 17 mm screws were placed to into L4 and 1 into L5 to secure the spacer.  Once these were locked in position the retractors were removed and final radiographs were obtained hemostasis was checked in the wound carefully and then the anterior longitudinal ligament was closed with #1 PDS suture.  2-0 Vicryl was used in the subcutaneous tissues and 3-0 Vicryl subcuticularly Dermabond was placed on the skin blood loss for the procedure was estimated at less than 50 cc.  Patient tolerated procedure well is returned to recovery room in stable condition

## 2018-10-03 ENCOUNTER — Encounter (HOSPITAL_COMMUNITY): Payer: Self-pay | Admitting: Neurological Surgery

## 2018-10-03 DIAGNOSIS — F419 Anxiety disorder, unspecified: Secondary | ICD-10-CM | POA: Diagnosis present

## 2018-10-03 DIAGNOSIS — Z885 Allergy status to narcotic agent status: Secondary | ICD-10-CM | POA: Diagnosis not present

## 2018-10-03 DIAGNOSIS — R000 Tachycardia, unspecified: Secondary | ICD-10-CM | POA: Diagnosis present

## 2018-10-03 DIAGNOSIS — Z8249 Family history of ischemic heart disease and other diseases of the circulatory system: Secondary | ICD-10-CM | POA: Diagnosis not present

## 2018-10-03 DIAGNOSIS — Z803 Family history of malignant neoplasm of breast: Secondary | ICD-10-CM | POA: Diagnosis not present

## 2018-10-03 DIAGNOSIS — Z833 Family history of diabetes mellitus: Secondary | ICD-10-CM | POA: Diagnosis not present

## 2018-10-03 DIAGNOSIS — Z79899 Other long term (current) drug therapy: Secondary | ICD-10-CM | POA: Diagnosis not present

## 2018-10-03 DIAGNOSIS — Z86718 Personal history of other venous thrombosis and embolism: Secondary | ICD-10-CM | POA: Diagnosis not present

## 2018-10-03 DIAGNOSIS — K219 Gastro-esophageal reflux disease without esophagitis: Secondary | ICD-10-CM | POA: Diagnosis present

## 2018-10-03 DIAGNOSIS — K567 Ileus, unspecified: Secondary | ICD-10-CM | POA: Diagnosis present

## 2018-10-03 DIAGNOSIS — Z888 Allergy status to other drugs, medicaments and biological substances status: Secondary | ICD-10-CM | POA: Diagnosis not present

## 2018-10-03 DIAGNOSIS — R110 Nausea: Secondary | ICD-10-CM | POA: Diagnosis not present

## 2018-10-03 DIAGNOSIS — M5116 Intervertebral disc disorders with radiculopathy, lumbar region: Secondary | ICD-10-CM | POA: Diagnosis present

## 2018-10-03 MED ORDER — METHOCARBAMOL 500 MG PO TABS: 500.0000 mg | ORAL_TABLET | Freq: Four times a day (QID) | ORAL | 3 refills | Status: DC | PRN

## 2018-10-03 MED ORDER — HYDROCODONE-ACETAMINOPHEN 5-325 MG PO TABS: 1.0000 | ORAL_TABLET | ORAL | 0 refills | Status: DC | PRN

## 2018-10-03 MED ORDER — MAGNESIUM CITRATE PO SOLN
1.0000 | Freq: Once | ORAL | Status: AC
  Administered 2018-10-03: 1 via ORAL
  Filled 2018-10-03: qty 296

## 2018-10-03 MED ORDER — HYDROXYZINE HCL 50 MG/ML IM SOLN
50.0000 mg | Freq: Four times a day (QID) | INTRAMUSCULAR | Status: DC | PRN
  Administered 2018-10-03: 50 mg via INTRAMUSCULAR
  Filled 2018-10-03: qty 1

## 2018-10-03 NOTE — Progress Notes (Signed)
Physical Therapy Treatment Patient Details Name: Laura Laura Shah Laura Shah MRN: [id number] DOB: [birth year] Today's Date: 10/03/2018    History of Present Illness Pt is a 38 y.o. F with significant PMH of prior discectomy (2019) and blood clots who presents s/p anterior exposure for L4-5 interbody fusion.    PT Comments    Pt progressing towards physical therapy goals. Was able to perform transfers and ambulation with gross min guard assist and RW for support. Pt improved with RW for pain control and energy conservation at this time. Anticipate pt will progress well as pain subsides.    Follow Up Recommendations  No PT follow up;Supervision for mobility/OOB     Equipment Recommendations  3in1 (PT)    Recommendations for Other Services       Precautions / Restrictions Precautions Precautions: Fall;Back Precaution Booklet Issued: Yes (comment) Precaution Comments: Pt able to state back precautions.  Verbally re-reviewed with Laura Laura Shah  Required Braces or Orthoses: Spinal Brace Spinal Brace: Lumbar corset;Applied in sitting position Restrictions Weight Bearing Restrictions: No    Mobility  Bed Mobility Overal bed mobility: Needs Assistance Bed Mobility: Rolling;Sidelying to Sit Rolling: Modified independent (Device/Increase time) Sidelying to sit: Supervision     Sit to sidelying: Supervision General bed mobility comments: VC's for proper log roll technique, and increased time required.   Transfers Overall transfer level: Needs assistance Equipment used: Rolling walker (2 wheeled) Transfers: Sit to/from Stand Sit to Stand: Supervision Stand pivot transfers: Min guard       General transfer comment: Slow and guarded due to pain. Pt was able to power-up to full stand without assistance.   Ambulation/Gait Ambulation/Gait assistance: Min guard Gait Distance (Feet): 120 Feet Assistive device: Rolling walker (2 wheeled) Gait Pattern/deviations: Step-through pattern;Decreased stride  length;Decreased step length - left Gait velocity: decreased Gait velocity interpretation: <1.31 ft/sec, indicative of household ambulator General Gait Details: Reporting progressive dizziness with ambulation. Initially without RW and shaking with pain. Improved with RW. Pt overall moving slow and very guarded.    Stairs             Wheelchair Mobility    Modified Rankin (Stroke Patients Only)       Balance Overall balance assessment: Needs assistance Sitting-balance support: Feet supported Sitting balance-[name]     Standing balance support: No upper extremity supported;During functional activity Standing balance-[name]                              Cognition Arousal/Alertness: Awake/alert Behavior During Therapy: WFL for tasks assessed/performed Overall Cognitive Status: Within Functional Limits for tasks assessed                                        Exercises      General Comments General comments (skin integrity, edema, etc.): 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 - discussed realistic expectations and instructed Laura Laura Shah to discuss with MD before returning to work       Pertinent Vitals/Pain Pain Assessment: Faces Faces Pain Scale: Hurts even more Pain Location: back Pain Descriptors / Indicators: Grimacing;Guarding Pain Intervention(s): Monitored during session;Limited activity within patient's tolerance    Home Living Family/patient expects to be discharged to:: Private residence Living Arrangements: Other (Comment)(boyfriend) Available Help at Discharge: Other (Comment)(significant other) Type of Home: House Home Access: Stairs to enter Entrance Stairs-Rails:  None Home Layout: One level Home Equipment: Walker - 2 wheels;Crutches Additional Comments: Pt state boyfriend can assist Laura Laura Shah at discharge.  She has Laura Laura Shah 38 y.o autistic Laura Shah, and Laura Laura Shah 38 y.o. 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   PT Goals (current goals can now be found in the care plan section) Acute Rehab PT Goals Patient Stated Goal: to have less pain, and return to work  PT Goal Formulation: With patient Time For Goal Achievement: 10/16/18 Potential to Achieve Goals: Good Progress towards PT goals: Progressing toward goals    Frequency    Min 5X/week      PT Plan Current plan remains appropriate    Co-evaluation              AM-PAC PT "6 Clicks" Mobility   Outcome Measure  Help needed turning from your back to your side while in a flat bed without using bedrails?: A Little Help needed moving from lying on your back to sitting on the side of a flat bed without using bedrails?: A Little Help needed moving to and from a bed to a chair (including a wheelchair)?: A Little Help needed standing up from a chair using your arms (e.g., wheelchair or bedside chair)?: A Little Help needed to walk in hospital room?: A Little Help needed climbing 3-5 steps with a railing? : A Little 6 Click Score: 18    End of Session Equipment Utilized During Treatment: Back brace Activity Tolerance: Patient limited by pain Patient left: in bed;with call bell/phone within reach;with family/visitor present Nurse Communication: Mobility status PT Visit Diagnosis: Pain;Difficulty in walking, not elsewhere classified (R26.2) Pain - part of body: (back)     Time: 4268-3419 PT Time Calculation (min) (ACUTE ONLY): 25 min  Charges:  $Gait Training: 23-37 mins                     Laura Laura Shah Laura Shah, PT, DPT Acute Rehabilitation Services Pager: [phone] Office: [phone]    Laura Laura Shah Laura Shah 10/03/2018, 1:35 PM

## 2018-10-03 NOTE — Progress Notes (Signed)
Patient ID: Laura Shah, female   DOB: Dec 08, 1980, 38 y.o.   MRN: [id number] Comfortable.  Reports moderate abdominal and back discomfort relieved with medication. Has had some nausea treated with Zofran Has not voided today. Abdomen soft and nontender.  Abdominal incision healing without difficulty. Stable postop day 1.  We will not follow actively.  Please call if we can assist

## 2018-10-03 NOTE — Anesthesia Postprocedure Evaluation (Signed)
Anesthesia Post Note  Patient: Laura Shah  Procedure(s) Performed: Lumbar four-five Anterior lumbar interbody (N/A ) ABDOMINAL EXPOSURE (N/A )     Patient location during evaluation: PACU Anesthesia Type: General Level of consciousness: awake and alert Pain management: pain level controlled Vital Signs Assessment: post-procedure vital signs reviewed and stable Respiratory status: spontaneous breathing, nonlabored ventilation, respiratory function stable and patient connected to nasal cannula oxygen Cardiovascular status: blood pressure returned to baseline and stable Postop Assessment: no apparent nausea or vomiting Anesthetic complications: no    Last Vitals:  Vitals:   10/03/18 0348 10/03/18 0732  BP: 108/66 119/66  Pulse: 80 80  Resp: 18 18  Temp: 36.8 C 36.9 C  SpO2: 99% 100%    Last Pain:  Vitals:   10/03/18 0732  TempSrc: Oral  PainSc:                  Hazen Brumett S

## 2018-10-03 NOTE — Evaluation (Signed)
Occupational Therapy Evaluation Patient Details Name: Laura Shah MRN: [id number] DOB: 1981-01-12 Today's Date: 10/03/2018    History of Present Illness Pt is a 38 y.o. F with significant PMH of prior discectomy (2019) and blood clots who presents s/p anterior exposure for L4-5 interbody fusion.   Clinical Impression   Patient evaluated by Occupational Therapy with no further acute OT needs identified. All education has been completed and the patient has no further questions. Pt significantly limited by pain.  She was able to attempt LB ADLs, but unable to engage in further activity.  Reviewed precautions and safe technique verbally and through demonstration. She was able to verbalize understanding - written handout provided. Per chart, and RN report, pt is for discharge this pm.   IF for some reason, she does not discharge, will establish treatment plan and goals and see while she is in hospital.  Anticipate excellent progress once pain decreases.  See below for any follow-up Occupational Therapy or equipment needs. OT is signing off. Thank you for this referral.      Follow Up Recommendations  No OT follow up;Supervision/Assistance - 24 hour    Equipment Recommendations  3 in 1 bedside commode    Recommendations for Other Services       Precautions / Restrictions Precautions Precautions: Fall;Back Precaution Booklet Issued: Yes (comment) Precaution Comments: Pt able to state back precautions.  Verbally re-reviewed with her  Required Braces or Orthoses: Spinal Brace Spinal Brace: Lumbar corset;Applied in sitting position Restrictions Weight Bearing Restrictions: No      Mobility Bed Mobility Overal bed mobility: Needs Assistance Bed Mobility: Rolling;Sit to Sidelying Rolling: Supervision       Sit to sidelying: Supervision General bed mobility comments: verbal cues for technique   Transfers Overall transfer level: Needs assistance Equipment used: None;Rolling  walker (2 wheeled) Transfers: Stand Pivot Transfers;Sit to/from Stand Sit to Stand: Min guard Stand pivot transfers: Min guard       General transfer comment: moves slowlly. She is tremulous due to pain     Balance Overall balance assessment: Needs assistance Sitting-balance support: Feet supported Sitting balance-[name]     Standing balance support: No upper extremity supported;During functional activity Standing balance-[name]                             ADL either performed or assessed with clinical judgement   ADL Overall ADL's : Needs assistance/impaired                       Lower Body Dressing Details (indicate cue type and reason): Pt able to perform figure 4 with Lt LE, but not fully with Rt LE - limited by pain.  She reports she was able to perform this PTA.  Discussed options for LB ADLs including AE, but she reports s/o will assist her until she can access her feet  Toilet Transfer: Min guard;Ambulation;Comfort height toilet;Grab bars;BSC;RW           Functional mobility during ADLs: Min guard;Rolling walker General ADL Comments: Pt with very limited activity tolerance due to increased pain.  She was instructed in safe technique for all ADLs and was able to verbalize understanding.       Vision         Perception     Praxis      Pertinent Vitals/Pain Pain Assessment: Faces Faces Pain Scale: Hurts even more Pain  Location: back Pain Descriptors / Indicators: Grimacing;Guarding Pain Intervention(s): Limited activity within patient's tolerance;Monitored during session;Repositioned     Hand Dominance Right   Extremity/Trunk Assessment     Lower Extremity Assessment RLE Deficits / Details: Knee extension 3+/5, ankle dorsiflexion/plantaflexion 1/5 LLE Deficits / Details: Knee extension 3+/5, ankle dorsiflexion/plantaflexion 2/5   Cervical / Trunk Assessment Cervical / Trunk Assessment: Other  exceptions Cervical / Trunk Exceptions: s/p lumbar fusion   Communication Communication Communication: No difficulties   Cognition Arousal/Alertness: Awake/alert Behavior During Therapy: WFL for tasks assessed/performed Overall Cognitive Status: Within Functional Limits for tasks assessed                                     General Comments  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 - discussed realistic expectations and instructed her to discuss with MD before returning to work     Exercises     Shoulder Instructions      Home Living Family/patient expects to be discharged to:: Private residence Living Arrangements: Other (Comment)(boyfriend) Available Help at Discharge: Other (Comment)(significant other) Type of Home: House Home Access: Stairs to enter [employer] of Steps: 4 Entrance Stairs-Rails: None Home Layout: One level     Bathroom Shower/Tub: Producer, television/film/video: Standard     Home Equipment: Environmental consultant - 2 wheels;Crutches   Additional Comments: Pt state boyfriend can assist her at discharge.  She has an 43 y.o autistic son, and an 76 y.o. 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        OT Problem List: Decreased activity tolerance;Impaired balance (sitting and/or standing);Decreased knowledge of use of DME or AE;Decreased knowledge of precautions;Pain      OT Treatment/Interventions:      OT Goals(Current goals can be found in the care plan section) Acute Rehab OT Goals Patient Stated Goal: to have less pain, and return to work  OT Goal Formulation: All assessment and education complete, DC therapy Potential to Achieve Goals: Good  OT Frequency:     Barriers to D/C:            Co-evaluation              AM-PAC OT "6 Clicks" Daily Activity     Outcome Measure Help from another person eating meals?: None Help  from another person taking care of personal grooming?: A Little Help from another person toileting, which includes using toliet, bedpan, or urinal?: A Little Help from another person bathing (including washing, rinsing, drying)?: A Little Help from another person to put on and taking off regular upper body clothing?: A Little Help from another person to put on and taking off regular lower body clothing?: A Lot 6 Click Score: 18   End of Session Equipment Utilized During Treatment: Rolling walker Nurse Communication: Mobility status  Activity Tolerance: Patient limited by pain Patient left: in bed;with call bell/phone within reach  OT Visit Diagnosis: Unsteadiness on feet (R26.81);Pain Pain - part of body: (back )                Time: 7371-0626 OT Time Calculation (min): 29 min Charges:  OT General Charges $OT Visit: 1 Visit OT Evaluation $OT Eval Moderate Complexity: 1 Mod OT Treatments $Self Care/Home Management : 8-22 mins  Jeani HawkingWendi Elyanah Farino, OTR/L Acute Rehabilitation Services Pager [phone] Office [phone]   Jeani HawkingConarpe, Tharun Cappella M 10/03/2018, 12:17 PM

## 2018-10-03 NOTE — Progress Notes (Signed)
Patient ID: Laura Shah, female   DOB: 1980/08/23, 38 y.o.   MRN: [id number] Vital signs are stable. Her right leg feels a bit weak Pulses are good in both lower extremities Doing well postop Plan discharge this afternoon

## 2018-10-03 NOTE — Discharge Summary (Addendum)
Physician Discharge Summary  Patient ID: Laura Shah MRN: [id number] DOB/AGE: 04-25-1980 38 y.o.  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  She tolerated surgery well.  Consults: vascular surgery  Significant Diagnostic Studies:  None   Treatments: surgery: Anterior lumbar decompression L4-L5 with interbody arthrodesis using titanium spacer and screw fixation.  Discharge Exam: Blood pressure 112/72, pulse 79, temperature 98.6 F (37 C), temperature source Oral, resp. rate 18, last menstrual period 09/08/2018, SpO2 100 %. Incision is clean and dry Station and gait are intact  Disposition: Discharge disposition: 01-Home or Self Care       Discharge Instructions     Remove dressing in 72 hours   Complete by: As directed    Call MD for:  difficulty breathing, headache or visual disturbances   Complete by: As directed    Call MD for:  hives   Complete by: As directed    Call MD for:  persistant dizziness or light-headedness   Complete by: As directed    Call MD for:  persistant nausea and vomiting   Complete by: As directed    Call MD for:  redness, tenderness, or signs of infection (pain, swelling, redness, odor or green/yellow discharge around incision site)   Complete by: As directed    Call MD for:  redness, tenderness, or signs of infection (pain, swelling, redness, odor or green/yellow discharge around incision site)   Complete by: As directed    Call MD for:  severe uncontrolled pain   Complete by: As directed     Call MD for:  severe uncontrolled pain   Complete by: As directed    Call MD for:  temperature >100.4   Complete by: As directed    Call MD for:  temperature >100.4   Complete by: As directed    Diet - low sodium heart healthy   Complete by: As directed    Diet - low sodium heart healthy   Complete by: As directed    Discharge instructions   Complete by: As directed    Okay to shower. Do not apply salves or appointments to incision. No heavy lifting with the upper extremities greater than 15 pounds. May resume driving when not requiring pain medication and patient feels comfortable with doing so.   Driving Restrictions   Complete by: As directed    No driving for 2 weeks, no riding in the car for 1 week   Incentive spirometry RT   Complete by: As directed    Incentive spirometry RT   Complete by: As directed    Increase activity slowly   Complete by: As directed    Increase activity slowly   Complete by: As directed    Lifting restrictions   Complete by: As directed    No lifting more than 8 lbs     Allergies as of 10/04/2018      Reactions   Reglan [metoclopramide] Anxiety   Morphine And Related Itching      Medication List    TAKE these medications   diphenhydramine-acetaminophen 25-500 MG Tabs tablet Commonly known as: TYLENOL PM Take 1 tablet by mouth  at bedtime.   HYDROcodone-acetaminophen 5-325 MG tablet Commonly known as: NORCO/VICODIN Take 1-2 tablets by mouth every 4 (four) hours as needed. What changed:   how much to take  when to take this  reasons to take this   HYDROcodone-acetaminophen 5-325 MG tablet Commonly known as: NORCO/VICODIN Take 1 tablet by mouth every 4 (four) hours as needed for moderate pain. What changed: You were already taking a medication with the same name, and this prescription was added. Make sure you understand how and when to take each.   meloxicam 15 MG tablet Commonly known as: MOBIC Take 15 mg by mouth daily.    methocarbamol 500 MG tablet Commonly known as: ROBAXIN Take 1 tablet (500 mg total) by mouth every 6 (six) hours as needed for muscle spasms.   pantoprazole 40 MG tablet Commonly known as: PROTONIX Take 1 tablet (40 mg total) by mouth daily. What changed: when to take this            Durable Medical Equipment  (From admission, onward)         Start     Ordered   10/03/18 1037  For home use only DME 3 n 1  Once     10/03/18 1036   10/03/18 1037  For home use only DME Walker  Once    Question:  Patient needs a walker to treat with the following condition  Answer:  S/P lumbar spinal fusion   10/03/18 1036         Follow-up Information    Barnett AbuElsner, Henry, MD Follow up.   Specialty: Neurosurgery Contact information: [address] [place] [address] [phone]           Signed: Tiana LoftKimberly Hannah [facility] 10/04/2018, 8:52 AM

## 2018-10-04 MED ORDER — HYDROCODONE-ACETAMINOPHEN 5-325 MG PO TABS: 1.0000 | ORAL_TABLET | ORAL | 0 refills | Status: DC | PRN

## 2018-10-04 NOTE — Discharge Instructions (Signed)

## 2018-10-04 NOTE — Progress Notes (Signed)
Physical Therapy Treatment Patient Details Name: Laura Shah MRN: [id number] DOB: [birth year] Today's Date: 10/04/2018    History of Present Illness Pt is a 38 y.o. F with significant PMH of prior discectomy (2019) and blood clots who presents s/p anterior exposure for L4-5 interbody fusion.    PT Comments    PT in chair on arrival awaiting D/C. PT able to state all precautions and reports feeling comfortable with all mobility. Pt able to increase gait distance minimally today and perform stairs with cues for sequence. Reviewed all precautions and brace wear with pt verbalizing understanding.     Follow Up Recommendations  No PT follow up;Supervision for mobility/OOB     Equipment Recommendations  3in1 (PT);Rolling walker with 5" wheels    Recommendations for Other Services       Precautions / Restrictions Precautions Precautions: Fall;Back Precaution Comments: Pt able to state back precautions.  Verbally re-reviewed with her  Required Braces or Orthoses: Spinal Brace Spinal Brace: Lumbar corset;Applied in sitting position    Mobility  Bed Mobility               General bed mobility comments: in chair on arrival  Transfers Overall transfer level: Modified independent                  Ambulation/Gait Ambulation/Gait assistance: Supervision Gait Distance (Feet): 140 Feet Assistive device: Rolling walker (2 wheeled) Gait Pattern/deviations: Step-through pattern;Decreased stride length   Gait velocity interpretation: 1.31 - 2.62 ft/sec, indicative of limited community ambulator General Gait Details: slow steady, guarded gait with reliance on RW. PT declined increased distance due to fatigue and pain   Stairs Stairs: Yes Stairs assistance: Min assist Stair Management: Step to pattern;Forwards Number of Stairs: 3 General stair comments: HHA with cues for seqeunce to ascend/descend stairs   Wheelchair Mobility    Modified Rankin (Stroke Patients  Only)       Balance Overall balance assessment: Mild deficits observed, not formally tested                                          Cognition Arousal/Alertness: Awake/alert Behavior During Therapy: WFL for tasks assessed/performed Overall Cognitive Status: Within Functional Limits for tasks assessed                                        Exercises      General Comments        Pertinent Vitals/Pain Pain Assessment: 0-10 Pain Score: 7  Pain Location: incision Pain Descriptors / Indicators: Grimacing;Guarding;Aching Pain Intervention(s): Limited activity within patient's tolerance;Monitored during session;Premedicated before session;Repositioned    Home Living                      Prior Function            PT Goals (current goals can now be found in the care plan section) Progress towards PT goals: Progressing toward goals    Frequency    Min 5X/week      PT Plan Current plan remains appropriate    Co-evaluation              AM-PAC PT "6 Clicks" Mobility   Outcome Measure  Help needed turning from your back to your side while in a  flat bed without using bedrails?: A Little Help needed moving from lying on your back to sitting on the side of a flat bed without using bedrails?: A Little Help needed moving to and from a bed to a chair (including a wheelchair)?: A Little Help needed standing up from a chair using your arms (e.g., wheelchair or bedside chair)?: A Little Help needed to walk in hospital room?: A Little Help needed climbing 3-5 steps with a railing? : A Little 6 Click Score: 18    End of Session Equipment Utilized During Treatment: Back brace Activity Tolerance: Patient tolerated treatment well Patient left: in chair;with call bell/phone within reach Nurse Communication: Mobility status PT Visit Diagnosis: Pain;Difficulty in walking, not elsewhere classified (R26.2);Other abnormalities of gait  and mobility (R26.89)     Time: 9449-6759 PT Time Calculation (min) (ACUTE ONLY): 12 min  Charges:  $Gait Training: 8-22 mins                     Garrard, PT Acute Rehabilitation Services Pager: [phone] Office: [phone]    Sandy Salaam Lucion Dilger 10/04/2018, 10:27 AM

## 2018-10-04 NOTE — Plan of Care (Signed)
Patient alert and oriented, mae's well, voiding adequate amount of urine, swallowing without difficulty, no c/o pain at time of discharge. Patient discharged home with family. Script and discharged instructions given to patient. Patient and family stated understanding of instructions given. Patient has an appointment with Dr. Elsner  

## 2018-10-07 MED FILL — Sodium Chloride IV Soln 0.9%: INTRAVENOUS | Qty: 1000 | Status: AC

## 2018-10-07 MED FILL — Heparin Sodium (Porcine) Inj 1000 Unit/ML: INTRAMUSCULAR | Qty: 30 | Status: AC

## 2018-10-10 ENCOUNTER — Inpatient Hospital Stay (HOSPITAL_BASED_OUTPATIENT_CLINIC_OR_DEPARTMENT_OTHER)
Admission: EM | Admit: 2018-10-10 | Discharge: 2018-10-13 | DRG: 607 | Disposition: A | Discharge status: Home / Self Care | Payer: BC Managed Care – PPO | Attending: Internal Medicine | Admitting: Internal Medicine

## 2018-10-10 ENCOUNTER — Other Ambulatory Visit: Payer: Self-pay

## 2018-10-10 ENCOUNTER — Emergency Department (HOSPITAL_BASED_OUTPATIENT_CLINIC_OR_DEPARTMENT_OTHER): Payer: BC Managed Care – PPO

## 2018-10-10 ENCOUNTER — Encounter (HOSPITAL_BASED_OUTPATIENT_CLINIC_OR_DEPARTMENT_OTHER): Payer: Self-pay

## 2018-10-10 DIAGNOSIS — Z79899 Other long term (current) drug therapy: Secondary | ICD-10-CM | POA: Diagnosis not present

## 2018-10-10 DIAGNOSIS — M519 Unspecified thoracic, thoracolumbar and lumbosacral intervertebral disc disorder: Secondary | ICD-10-CM | POA: Diagnosis not present

## 2018-10-10 DIAGNOSIS — T498X5A Adverse effect of other topical agents, initial encounter: Secondary | ICD-10-CM | POA: Diagnosis not present

## 2018-10-10 DIAGNOSIS — Z9049 Acquired absence of other specified parts of digestive tract: Secondary | ICD-10-CM | POA: Diagnosis not present

## 2018-10-10 DIAGNOSIS — Z20828 Contact with and (suspected) exposure to other viral communicable diseases: Secondary | ICD-10-CM | POA: Diagnosis not present

## 2018-10-10 DIAGNOSIS — K219 Gastro-esophageal reflux disease without esophagitis: Secondary | ICD-10-CM

## 2018-10-10 DIAGNOSIS — L0390 Cellulitis, unspecified: Secondary | ICD-10-CM | POA: Diagnosis present

## 2018-10-10 DIAGNOSIS — L03311 Cellulitis of abdominal wall: Secondary | ICD-10-CM

## 2018-10-10 DIAGNOSIS — L231 Allergic contact dermatitis due to adhesives: Principal | ICD-10-CM | POA: Diagnosis present

## 2018-10-10 DIAGNOSIS — M79662 Pain in left lower leg: Secondary | ICD-10-CM | POA: Diagnosis present

## 2018-10-10 DIAGNOSIS — G8918 Other acute postprocedural pain: Secondary | ICD-10-CM

## 2018-10-10 DIAGNOSIS — Z981 Arthrodesis status: Secondary | ICD-10-CM

## 2018-10-10 DIAGNOSIS — Z86718 Personal history of other venous thrombosis and embolism: Secondary | ICD-10-CM | POA: Diagnosis not present

## 2018-10-10 DIAGNOSIS — R0602 Shortness of breath: Secondary | ICD-10-CM | POA: Diagnosis not present

## 2018-10-10 LAB — CBC WITH DIFFERENTIAL/PLATELET
Abs Immature Granulocytes: 0.02 10*3/uL (ref 0.00–0.07)
Basophils Absolute: 0 10*3/uL (ref 0.0–0.1)
Basophils Relative: 0 %
Eosinophils Absolute: 0.8 10*3/uL — ABNORMAL HIGH (ref 0.0–0.5)
Eosinophils Relative: 13 %
HCT: 38 % (ref 36.0–46.0)
Hemoglobin: 11.7 g/dL — ABNORMAL LOW (ref 12.0–15.0)
Immature Granulocytes: 0 %
Lymphocytes Relative: 29 %
Lymphs Abs: 1.7 10*3/uL (ref 0.7–4.0)
MCH: 26.2 pg (ref 26.0–34.0)
MCHC: 30.8 g/dL (ref 30.0–36.0)
MCV: 85 fL (ref 80.0–100.0)
Monocytes Absolute: 0.7 10*3/uL (ref 0.1–1.0)
Monocytes Relative: 11 %
Neutro Abs: 2.7 10*3/uL (ref 1.7–7.7)
Neutrophils Relative %: 47 %
Platelets: 296 10*3/uL (ref 150–400)
RBC: 4.47 MIL/uL (ref 3.87–5.11)
RDW: 14.1 % (ref 11.5–15.5)
WBC: 5.9 10*3/uL (ref 4.0–10.5)
nRBC: 0 % (ref 0.0–0.2)

## 2018-10-10 LAB — COMPREHENSIVE METABOLIC PANEL
ALT: 11 U/L (ref 0–44)
AST: 14 U/L — ABNORMAL LOW (ref 15–41)
Albumin: 4.2 g/dL (ref 3.5–5.0)
Alkaline Phosphatase: 88 U/L (ref 38–126)
Anion gap: 11 (ref 5–15)
BUN: 11 mg/dL (ref 6–20)
CO2: 22 mmol/L (ref 22–32)
Calcium: 9.4 mg/dL (ref 8.9–10.3)
Chloride: 104 mmol/L (ref 98–111)
Creatinine, Ser: 0.84 mg/dL (ref 0.44–1.00)
GFR calc Af Amer: >60 mL/min (ref >60)
GFR calc non Af Amer: >60 mL/min (ref >60)
Glucose, Bld: 94 mg/dL (ref 70–99)
Potassium: 4.2 mmol/L (ref 3.5–5.1)
Sodium: 137 mmol/L (ref 135–145)
Total Bilirubin: 0.3 mg/dL (ref 0.3–1.2)
Total Protein: 8.1 g/dL (ref 6.5–8.1)

## 2018-10-10 LAB — SARS CORONAVIRUS 2 BY RT PCR (HOSPITAL ORDER, PERFORMED IN ~~LOC~~ HOSPITAL LAB): SARS Coronavirus 2: NEGATIVE

## 2018-10-10 LAB — C-REACTIVE PROTEIN: CRP: 0.9 mg/dL (ref <1.0)

## 2018-10-10 LAB — LACTIC ACID, PLASMA: Lactic Acid, Venous: 1 mmol/L (ref 0.5–1.9)

## 2018-10-10 LAB — PREGNANCY, URINE: Preg Test, Ur: NEGATIVE

## 2018-10-10 LAB — SEDIMENTATION RATE: Sed Rate: 25 mm/hr — ABNORMAL HIGH (ref 0–22)

## 2018-10-10 MED ORDER — IOHEXOL 350 MG/ML SOLN
100.0000 mL | Freq: Once | INTRAVENOUS | Status: AC | PRN
  Administered 2018-10-10: 100 mL via INTRAVENOUS

## 2018-10-10 MED ORDER — VANCOMYCIN HCL IN DEXTROSE 750-5 MG/150ML-% IV SOLN
750.0000 mg | Freq: Two times a day (BID) | INTRAVENOUS | Status: DC
  Administered 2018-10-11 – 2018-10-12 (×4): 750 mg via INTRAVENOUS
  Filled 2018-10-10 (×7): qty 150

## 2018-10-10 MED ORDER — SODIUM CHLORIDE 0.9 % IV SOLN
2.0000 g | Freq: Once | INTRAVENOUS | Status: AC
  Administered 2018-10-10: 2 g via INTRAVENOUS
  Filled 2018-10-10: qty 2

## 2018-10-10 MED ORDER — HYDROMORPHONE HCL 1 MG/ML IJ SOLN
1.0000 mg | Freq: Once | INTRAMUSCULAR | Status: AC
  Administered 2018-10-10: 1 mg via INTRAVENOUS
  Filled 2018-10-10: qty 1

## 2018-10-10 MED ORDER — SODIUM CHLORIDE 0.9 % IV SOLN
2.0000 g | Freq: Three times a day (TID) | INTRAVENOUS | Status: DC
  Administered 2018-10-11 – 2018-10-12 (×6): 2 g via INTRAVENOUS
  Filled 2018-10-10 (×8): qty 2

## 2018-10-10 MED ORDER — VANCOMYCIN HCL IN DEXTROSE 1-5 GM/200ML-% IV SOLN
1000.0000 mg | Freq: Once | INTRAVENOUS | Status: AC
  Administered 2018-10-10: 1000 mg via INTRAVENOUS
  Filled 2018-10-10: qty 200

## 2018-10-10 MED ORDER — PANTOPRAZOLE SODIUM 40 MG PO TBEC
40.0000 mg | DELAYED_RELEASE_TABLET | Freq: Once | ORAL | Status: AC
  Administered 2018-10-10: 40 mg via ORAL
  Filled 2018-10-10: qty 1

## 2018-10-10 MED ORDER — SODIUM CHLORIDE 0.9 % IV BOLUS
1000.0000 mL | Freq: Once | INTRAVENOUS | Status: AC
  Administered 2018-10-10: 1000 mL via INTRAVENOUS

## 2018-10-10 NOTE — ED Triage Notes (Addendum)
Pt c/o redness, drainage to abd area-post op anterior approach to lumbar surgery 9/3-states she was seen this week at PCP for wellness visit and was started on abx for redness to abd-pt has not had 2 week post op appt with neuro-slow gait with walker-wearing a back brace-NAD

## 2018-10-10 NOTE — ED Notes (Signed)
[facility] notified Laura Shah) - hospitalist consult @  [facility] per Dr. Billy Fischer

## 2018-10-10 NOTE — ED Notes (Signed)
Pt reports a feeling of impending doom last night and today. Pt c/o SOB when ambulating. 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

## 2018-10-10 NOTE — ED Notes (Signed)
Patient transported to CT 

## 2018-10-10 NOTE — ED Notes (Signed)
edp at bedside for blooddraw

## 2018-10-10 NOTE — ED Notes (Signed)
[facility] notified (Tammy) - patient ready for transport 

## 2018-10-10 NOTE — Progress Notes (Signed)
Pharmacy Antibiotic Note  SAMANDA BUSKE is a 38 y.o. female admitted on 10/10/2018 with wound infection.  Pharmacy has been consulted for vancomycin and cefepime dosing. Pt is afebrile and WBC is WNL. SCr is WNL.   Plan: Vancomycin 1gm IV x 1 then 750mg  IV Q12H Cefepime 2gm IV Q8H  F/u renal fxn, C&S, clinical status and peak/trough at SS     Temp (24hrs), Avg:98.3 F (36.8 C), Min:98.3 F (36.8 C), Max:98.3 F (36.8 C)  Recent Labs  Lab 10/10/18 1932  WBC 5.9  CREATININE 0.84    Estimated Creatinine Clearance: 80.8 mL/min (by C-G formula based on SCr of 0.84 mg/dL).    Allergies  Allergen Reactions  . Reglan [Metoclopramide] Anxiety  . Morphine And Related Itching    Antimicrobials this admission: Vanc 9/11>> Cefepime 9/11>>  Dose adjustments this admission: N/A  Microbiology results: Pending  Thank you for allowing pharmacy to be a part of this patient's care.  Gavinn Collard, Rande Lawman 10/10/2018 10:37 PM

## 2018-10-10 NOTE — ED Notes (Signed)
ED TO INPATIENT HANDOFF REPORT  ED Nurse Name and Phone #: Misty Stanley [phone] S Name/Age/Gender Laura Shah 38 y.o. female Room/Bed: MH05/MH05  Code Status   Code Status: Prior  Home/SNF/Other Home Patient oriented to: self, place, time and situation Is this baseline? Yes   Triage Complete: Triage complete  Chief Complaint POST OP INFECTION  Triage Note Pt c/o redness, drainage to abd area-post op anterior approach to lumbar surgery 9/3-states she was seen this week at PCP for wellness visit and was started on abx for redness to abd-pt has not had 2 week post op appt with neuro-slow gait with walker-wearing a back brace-NAD   Allergies Allergies  Allergen Reactions  . Reglan [Metoclopramide] Anxiety  . Morphine And Related Itching    Level of Care/Admitting Diagnosis ED Disposition    ED Disposition Condition Comment   Admit  Hospital Area: [facility] [[id number]]  Level of Care: Telemetry Medical [104]  I expect the patient will be discharged within 24 hours: Yes  LOW acuity---Tx typically complete <24 hrs---ACUTE conditions typically can be evaluated <24 hours---LABS likely to return to acceptable levels <24 hours---IS near functional baseline---EXPECTED to return to current living arrangement---NOT newly hypoxic: Meets criteria for 5C-Observation unit  Covid Evaluation: Asymptomatic Screening Protocol (No Symptoms)  Diagnosis: Cellulitis [454098]  Admitting Physician: John Giovanni [[id number]]  Attending Physician: John Giovanni [[id number]]  PT Class (Do Not Modify): Observation [104]  PT Acc Code (Do Not Modify): Observation [10022]       B Medical/Surgery History Past Medical History:  Diagnosis Date  . Anemia   . Anxiety   . Back pain   . GERD (gastroesophageal reflux disease)   . Heart valve regurgitation    trivial PR, MR, TR 2014 echo [facility])  . 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 Ileus (HCC)   . Interstitial cystitis   . Lumbar herniated disc 04/17/2017   L4/L5. Ruptured.  . Pancreatitis   . Right leg weakness    and numbness   . Tachycardia    Past Surgical History:  Procedure Laterality Date  . ABDOMINAL EXPOSURE N/A 10/02/2018   Procedure: ABDOMINAL EXPOSURE;  Surgeon: Larina Earthly, MD;  Location: [facility] OR;  Service: Vascular;  Laterality: N/A;  . ANTERIOR LUMBAR FUSION N/A 10/02/2018   Procedure: Lumbar four-five Anterior lumbar interbody;  Surgeon: Barnett Abu, MD;  Location: [facility] OR;  Service: Neurosurgery;  Laterality: N/A;  . APPENDECTOMY    . CESAREAN SECTION    . CHOLECYSTECTOMY    . ESOPHAGOGASTRODUODENOSCOPY (EGD) WITH PROPOFOL N/A 03/18/2015   Procedure: ESOPHAGOGASTRODUODENOSCOPY (EGD) WITH PROPOFOL;  Surgeon: Sherrilyn Rist, MD;  Location: [facility] ENDOSCOPY;  Service: Endoscopy;  Laterality: N/A;  . GASTRECTOMY     laparoscopic sleeve gastrectomy 01/27/15  . Leg Pain    . LUMBAR DISC SURGERY     L4-5 discectomy 05/2017  . OVARIAN CYST REMOVAL     Ruptured     A IV Location/Drains/Wounds Patient Lines/Drains/Airways Status   Active Line/Drains/Airways    Name:   Placement date:   Placement time:   Site:   Days:   Peripheral IV 10/10/18 Right Hand   10/10/18    1855    Hand   less than 1   Peripheral IV 10/10/18 Left Antecubital   10/10/18    1958    Antecubital   less than 1   Incision (Closed) 10/02/18  Abdomen Other (Comment)   10/02/18    0900     8          Intake/Output Last 24 hours No intake or output data in the 24 hours ending 10/10/18 2334  Labs/Imaging Results for orders placed or performed during the hospital encounter of 10/10/18 (from the past 48 hour(s))  C-reactive protein     Status: None   Collection Time: 10/10/18  6:57 PM  Result Value Ref Range   CRP 0.9 <1.0 mg/dL    Comment: Performed at [facility], [address], [place], [place] [address]  CBC with Differential     Status: Abnormal    Collection Time: 10/10/18  7:32 PM  Result Value Ref Range   WBC 5.9 4.0 - 10.5 K/uL   RBC 4.47 3.87 - 5.11 MIL/uL   Hemoglobin 11.7 (L) 12.0 - 15.0 g/dL   HCT 84.0 37.5 - 43.6 %   MCV 85.0 80.0 - 100.0 fL   MCH 26.2 26.0 - 34.0 pg   MCHC 30.8 30.0 - 36.0 g/dL   RDW 06.7 70.3 - 40.3 %   Platelets 296 150 - 400 K/uL   nRBC 0.0 0.0 - 0.2 %   Neutrophils Relative % 47 %   Neutro Abs 2.7 1.7 - 7.7 K/uL   Lymphocytes Relative 29 %   Lymphs Abs 1.7 0.7 - 4.0 K/uL   Monocytes Relative 11 %   Monocytes Absolute 0.7 0.1 - 1.0 K/uL   Eosinophils Relative 13 %   Eosinophils Absolute 0.8 (H) 0.0 - 0.5 K/uL   Basophils Relative 0 %   Basophils Absolute 0.0 0.0 - 0.1 K/uL   Immature Granulocytes 0 %   Abs Immature Granulocytes 0.02 0.00 - 0.07 K/uL    Comment: Performed at [facility], [address] [place], [place] [address]  Comprehensive metabolic panel     Status: Abnormal   Collection Time: 10/10/18  7:32 PM  Result Value Ref Range   Sodium 137 135 - 145 mmol/L   Potassium 4.2 3.5 - 5.1 mmol/L   Chloride 104 98 - 111 mmol/L   CO2 22 22 - 32 mmol/L   Glucose, Bld 94 70 - 99 mg/dL   BUN 11 6 - 20 mg/dL   Creatinine, Ser 8.59 0.44 - 1.00 mg/dL   Calcium 9.4 8.9 - 09.3 mg/dL   Total Protein 8.1 6.5 - 8.1 g/dL   Albumin 4.2 3.5 - 5.0 g/dL   AST 14 (L) 15 - 41 U/L   ALT 11 0 - 44 U/L   Alkaline Phosphatase 88 38 - 126 U/L   Total Bilirubin 0.3 0.3 - 1.2 mg/dL   GFR calc non Af Amer >60 >60 mL/min   GFR calc Af Amer >60 >60 mL/min   Anion gap 11 5 - 15    Comment: Performed at [facility], [address] [place], [place] [address]  Sedimentation rate     Status: Abnormal   Collection Time: 10/10/18  7:32 PM  Result Value Ref Range   Sed Rate 25 (H) 0 - 22 mm/hr    Comment: Performed at [facility] [address] [place], [place] [address]  Pregnancy, urine     Status: None   Collection Time: 10/10/18  7:35 PM  Result Value Ref Range    Preg Test, Ur NEGATIVE NEGATIVE    Comment:        THE SENSITIVITY OF  THIS METHODOLOGY IS >20 mIU/mL. Performed at [facility] [place], [address] [place], [place] [address]   Lactic acid, plasma     Status: None   Collection Time: 10/10/18 10:41 PM  Result Value Ref Range   Lactic Acid, Venous 1.0 0.5 - 1.9 mmol/L    Comment: Performed at [facility] [place], [address] [place], [place] [address]   Ct Angio Chest Pe W And/or Wo Contrast  Addendum Date: 10/10/2018   ADDENDUM REPORT: 10/10/2018 21:43 ADDENDUM: Impression #6 should include: Small cystic foci at the uterine fundus, nonspecific but could reflect fluid within the endometrial canal or degenerating fibroid. Correlate with patient's menstrual status and consider pelvic ultrasound if felt to be clinically warranted. Electronically Signed   By: Kreg ShropshirePrice  DeHay M.D.   On: 10/10/2018 21:43   Result Date: 10/10/2018 CLINICAL DATA:  Shortness of breath, abdominal pain, acute generalized, recent surgery and concern for abscess EXAM: CT ANGIOGRAPHY CHEST CT ABDOMEN AND PELVIS WITH CONTRAST TECHNIQUE: Multidetector CT imaging of the chest was performed using the standard protocol during bolus administration of intravenous contrast. Multiplanar CT image reconstructions and MIPs were obtained to evaluate the vascular anatomy. Multidetector CT imaging of the abdomen and pelvis was performed using the standard protocol during bolus administration of intravenous contrast. CONTRAST:  100mL OMNIPAQUE IOHEXOL 350 MG/ML SOLN COMPARISON:  CT chest, abdomen and pelvis Jun 22, 2017 FINDINGS: CTA CHEST FINDINGS Cardiovascular: Satisfactory opacification of the pulmonary arteries. No pulmonary arterial filling defects are identified. Pulmonary arteries are normal caliber. Normal heart size. No pericardial effusion. The aorta is normal caliber. Normal 3 vessel arch. Major venous structures are unremarkable. Mediastinum/Nodes: No enlarged mediastinal,  hilar or axillary lymph nodes. Thyroid gland, trachea, and esophagus demonstrate no significant findings. Lungs/Pleura: No consolidation, features of edema, pneumothorax, or effusion. No suspicious pulmonary nodules or masses. Musculoskeletal: No chest wall abnormality. No acute or significant osseous findings. Review of the MIP images confirms the above findings. CT ABDOMEN and PELVIS FINDINGS Hepatobiliary: No focal liver abnormality is seen. Patient is post cholecystectomy. Slight prominence of the biliary tree likely related to reservoir effect. No calcified intraductal gallstones. Pancreas: Unremarkable. No pancreatic ductal dilatation or surrounding inflammatory changes. Spleen: Normal in size without focal abnormality. Adrenals/Urinary Tract: Adrenal glands are unremarkable. Kidneys are normal, without renal calculi, focal lesion, or hydronephrosis. Mild circumferential bladder wall thickening. Stomach/Bowel: Postsurgical changes from prior gastric sleeve. Duodenal sweep is unremarkable. No small bowel dilatation or wall thickening. The appendix is surgically absent. No colonic dilatation or wall thickening. Vascular/Lymphatic: The aorta is normal caliber. No suspicious or enlarged lymph nodes in the included lymphatic chains. Reproductive: There is been interval removal of the radiopaque IUD seen on comparison study. The uterus heterogeneously enhances with several foci suggestive of uterine fibroids. Small cystic focus is seen within the posterior aspect of the uterine fundus. Normal follicles seen in both adnexa. Other: No intraperitoneal free fluid or gas. There are postsurgical changes of the lower anterior abdominal wall to the left of midline with a small lentiform fluid collection measuring 10 mm in maximal thickness along the anterior abdominal wall and few adjacent foci of subcutaneous gas. No abnormal dehiscence of the anterior abdominal wall. No rim enhancing collection is identified.  Musculoskeletal: Interval L4-5 anterior spinal fusion with placement of an interbody spacer cage. No other acute osseous abnormality or suspicious osseous lesions. Review of the MIP images confirms the above findings. IMPRESSION: 1. No evidence of pulmonary embolism. No  acute intrathoracic process. 2. Postsurgical changes of the left low anterior abdominal wall likely related to recent anterior lumbar spinal fusion and interbody spacer placement at L4-5. The presence of fluid and gas near the incision site is nonspecific given the time course since recent surgery. Small amount of overlying skin thickening is expected postoperatively though should correlate with clinical assessment to exclude a cellulitis. No evidence of abdominal wall dehiscence or organized abscess at this time. No intraperitoneal free fluid or gas. 3. Mild circumferential bladder wall thickening is nonspecific, and can be seen with cystitis. Correlate with urinalysis. 4. Postsurgical changes of prior gastric sleeve without evidence of complication. 5. Interval removal of the radiopaque IUD seen on comparison study. 6. Fibroid uterus. Electronically Signed: By: Kreg ShropshirePrice  DeHay M.D. On: 10/10/2018 21:39   Ct Abdomen Pelvis W Contrast  Addendum Date: 10/10/2018   ADDENDUM REPORT: 10/10/2018 21:43 ADDENDUM: Impression #6 should include: Small cystic foci at the uterine fundus, nonspecific but could reflect fluid within the endometrial canal or degenerating fibroid. Correlate with patient's menstrual status and consider pelvic ultrasound if felt to be clinically warranted. Electronically Signed   By: Kreg ShropshirePrice  DeHay M.D.   On: 10/10/2018 21:43   Result Date: 10/10/2018 CLINICAL DATA:  Shortness of breath, abdominal pain, acute generalized, recent surgery and concern for abscess EXAM: CT ANGIOGRAPHY CHEST CT ABDOMEN AND PELVIS WITH CONTRAST TECHNIQUE: Multidetector CT imaging of the chest was performed using the standard protocol during bolus  administration of intravenous contrast. Multiplanar CT image reconstructions and MIPs were obtained to evaluate the vascular anatomy. Multidetector CT imaging of the abdomen and pelvis was performed using the standard protocol during bolus administration of intravenous contrast. CONTRAST:  100mL OMNIPAQUE IOHEXOL 350 MG/ML SOLN COMPARISON:  CT chest, abdomen and pelvis Jun 22, 2017 FINDINGS: CTA CHEST FINDINGS Cardiovascular: Satisfactory opacification of the pulmonary arteries. No pulmonary arterial filling defects are identified. Pulmonary arteries are normal caliber. Normal heart size. No pericardial effusion. The aorta is normal caliber. Normal 3 vessel arch. Major venous structures are unremarkable. Mediastinum/Nodes: No enlarged mediastinal, hilar or axillary lymph nodes. Thyroid gland, trachea, and esophagus demonstrate no significant findings. Lungs/Pleura: No consolidation, features of edema, pneumothorax, or effusion. No suspicious pulmonary nodules or masses. Musculoskeletal: No chest wall abnormality. No acute or significant osseous findings. Review of the MIP images confirms the above findings. CT ABDOMEN and PELVIS FINDINGS Hepatobiliary: No focal liver abnormality is seen. Patient is post cholecystectomy. Slight prominence of the biliary tree likely related to reservoir effect. No calcified intraductal gallstones. Pancreas: Unremarkable. No pancreatic ductal dilatation or surrounding inflammatory changes. Spleen: Normal in size without focal abnormality. Adrenals/Urinary Tract: Adrenal glands are unremarkable. Kidneys are normal, without renal calculi, focal lesion, or hydronephrosis. Mild circumferential bladder wall thickening. Stomach/Bowel: Postsurgical changes from prior gastric sleeve. Duodenal sweep is unremarkable. No small bowel dilatation or wall thickening. The appendix is surgically absent. No colonic dilatation or wall thickening. Vascular/Lymphatic: The aorta is normal caliber. No  suspicious or enlarged lymph nodes in the included lymphatic chains. Reproductive: There is been interval removal of the radiopaque IUD seen on comparison study. The uterus heterogeneously enhances with several foci suggestive of uterine fibroids. Small cystic focus is seen within the posterior aspect of the uterine fundus. Normal follicles seen in both adnexa. Other: No intraperitoneal free fluid or gas. There are postsurgical changes of the lower anterior abdominal wall to the left of midline with a small lentiform fluid collection measuring 10 mm in maximal thickness along the anterior  abdominal wall and few adjacent foci of subcutaneous gas. No abnormal dehiscence of the anterior abdominal wall. No rim enhancing collection is identified. Musculoskeletal: Interval L4-5 anterior spinal fusion with placement of an interbody spacer cage. No other acute osseous abnormality or suspicious osseous lesions. Review of the MIP images confirms the above findings. IMPRESSION: 1. No evidence of pulmonary embolism. No acute intrathoracic process. 2. Postsurgical changes of the left low anterior abdominal wall likely related to recent anterior lumbar spinal fusion and interbody spacer placement at L4-5. The presence of fluid and gas near the incision site is nonspecific given the time course since recent surgery. Small amount of overlying skin thickening is expected postoperatively though should correlate with clinical assessment to exclude a cellulitis. No evidence of abdominal wall dehiscence or organized abscess at this time. No intraperitoneal free fluid or gas. 3. Mild circumferential bladder wall thickening is nonspecific, and can be seen with cystitis. Correlate with urinalysis. 4. Postsurgical changes of prior gastric sleeve without evidence of complication. 5. Interval removal of the radiopaque IUD seen on comparison study. 6. Fibroid uterus. Electronically Signed: By: Lovena Le M.D. On: 10/10/2018 21:39     Pending Labs Unresulted Labs (From admission, onward)    Start     Ordered   10/10/18 2232  SARS Coronavirus 2 [facility] order, Performed in [facility] hospital lab) Nasopharyngeal Nasopharyngeal Swab  (Symptomatic/High Risk of Exposure/Tier 1 Patients Labs with Precautions)  Once,   STAT    Question Answer Comment  Is this test for diagnosis or screening Diagnosis of ill patient   Symptomatic for COVID-19 as defined by CDC Yes   Date of Symptom Onset 10/07/2018   Hospitalized for COVID-19 Yes   Admitted to ICU for COVID-19 No   Previously tested for COVID-19 Yes   Resident in a congregate (group) care setting No   Employed in healthcare setting No   Pregnant No      10/10/18 2231   10/10/18 2231  Blood culture (routine x 2)  BLOOD CULTURE X 2,   STAT     10/10/18 2231   10/10/18 2231  Lactic acid, plasma  Now then every 2 hours,   STAT     10/10/18 2231          Vitals/Pain Today's Vitals   10/10/18 2013 10/10/18 2100 10/10/18 2106 10/10/18 2200  BP:  (!) 127/99  127/68  Pulse:  98  91  Resp:  14  13  Temp:      TempSrc:      SpO2:  100%  100%  PainSc: 7   6      Isolation Precautions No active isolations  Medications Medications  vancomycin (VANCOCIN) IVPB 1000 mg/200 mL premix (1,000 mg Intravenous New Bag/Given 10/10/18 2327)  ceFEPIme (MAXIPIME) 2 g in sodium chloride 0.9 % 100 mL IVPB (has no administration in time range)  vancomycin (VANCOCIN) IVPB 750 mg/150 ml premix (has no administration in time range)  HYDROmorphone (DILAUDID) injection 1 mg (1 mg Intravenous Given 10/10/18 1952)  sodium chloride 0.9 % bolus 1,000 mL (0 mLs Intravenous Stopped 10/10/18 2128)  iohexol (OMNIPAQUE) 350 MG/ML injection 100 mL (100 mLs Intravenous Contrast Given 10/10/18 2040)  ceFEPIme (MAXIPIME) 2 g in sodium chloride 0.9 % 100 mL IVPB (0 g Intravenous Stopped 10/10/18 2321)  pantoprazole (PROTONIX) EC tablet 40 mg (40 mg Oral Given 10/10/18 2310)    Mobility walks with  device Low fall risk   Focused Assessments none   R  Recommendations: See Admitting Provider Note  Report given to:   Additional Notes: none

## 2018-10-10 NOTE — ED Notes (Signed)
No blood at this time. EDP made aware.

## 2018-10-10 NOTE — ED Notes (Signed)
Attempted IV- unsuccessful at this time.

## 2018-10-11 DIAGNOSIS — Z9049 Acquired absence of other specified parts of digestive tract: Secondary | ICD-10-CM | POA: Diagnosis not present

## 2018-10-11 DIAGNOSIS — Z79899 Other long term (current) drug therapy: Secondary | ICD-10-CM | POA: Diagnosis not present

## 2018-10-11 DIAGNOSIS — K219 Gastro-esophageal reflux disease without esophagitis: Secondary | ICD-10-CM

## 2018-10-11 DIAGNOSIS — M79662 Pain in left lower leg: Secondary | ICD-10-CM | POA: Diagnosis present

## 2018-10-11 DIAGNOSIS — Z981 Arthrodesis status: Secondary | ICD-10-CM | POA: Diagnosis not present

## 2018-10-11 DIAGNOSIS — L03311 Cellulitis of abdominal wall: Secondary | ICD-10-CM | POA: Diagnosis not present

## 2018-10-11 DIAGNOSIS — Z20828 Contact with and (suspected) exposure to other viral communicable diseases: Secondary | ICD-10-CM | POA: Diagnosis present

## 2018-10-11 DIAGNOSIS — R0602 Shortness of breath: Secondary | ICD-10-CM | POA: Diagnosis present

## 2018-10-11 DIAGNOSIS — L231 Allergic contact dermatitis due to adhesives: Secondary | ICD-10-CM | POA: Diagnosis present

## 2018-10-11 DIAGNOSIS — T498X5A Adverse effect of other topical agents, initial encounter: Secondary | ICD-10-CM | POA: Diagnosis present

## 2018-10-11 DIAGNOSIS — Z86718 Personal history of other venous thrombosis and embolism: Secondary | ICD-10-CM | POA: Diagnosis not present

## 2018-10-11 DIAGNOSIS — M519 Unspecified thoracic, thoracolumbar and lumbosacral intervertebral disc disorder: Secondary | ICD-10-CM | POA: Diagnosis present

## 2018-10-11 LAB — URINALYSIS, ROUTINE W REFLEX MICROSCOPIC
Bacteria, UA: NONE SEEN
Bilirubin Urine: NEGATIVE
Glucose, UA: NEGATIVE mg/dL
Hgb urine dipstick: NEGATIVE
Ketones, ur: NEGATIVE mg/dL
Nitrite: NEGATIVE
Protein, ur: NEGATIVE mg/dL
Specific Gravity, Urine: 1.014 (ref 1.005–1.030)
pH: 6 (ref 5.0–8.0)

## 2018-10-11 LAB — HIV ANTIBODY (ROUTINE TESTING W REFLEX): HIV Screen 4th Generation wRfx: NONREACTIVE

## 2018-10-11 LAB — PROCALCITONIN: Procalcitonin: <0.1 ng/mL

## 2018-10-11 MED ORDER — FENTANYL CITRATE (PF) 100 MCG/2ML IJ SOLN
25.0000 ug | INTRAMUSCULAR | Status: DC | PRN
  Administered 2018-10-11 – 2018-10-12 (×2): 25 ug via INTRAVENOUS
  Filled 2018-10-11 (×2): qty 2

## 2018-10-11 MED ORDER — DIPHENHYDRAMINE HCL 50 MG/ML IJ SOLN
25.0000 mg | Freq: Four times a day (QID) | INTRAMUSCULAR | Status: DC | PRN
  Administered 2018-10-11 – 2018-10-13 (×5): 25 mg via INTRAVENOUS
  Filled 2018-10-11 (×5): qty 1

## 2018-10-11 MED ORDER — ACETAMINOPHEN 650 MG RE SUPP: 650.0000 mg | Freq: Four times a day (QID) | RECTAL | Status: DC | PRN

## 2018-10-11 MED ORDER — ALUM & MAG HYDROXIDE-SIMETH 200-200-20 MG/5ML PO SUSP: 30.0000 mL | Freq: Four times a day (QID) | ORAL | Status: DC | PRN

## 2018-10-11 MED ORDER — METHOCARBAMOL 500 MG PO TABS
500.0000 mg | ORAL_TABLET | Freq: Three times a day (TID) | ORAL | Status: DC | PRN
  Administered 2018-10-11 – 2018-10-13 (×5): 500 mg via ORAL
  Filled 2018-10-11 (×5): qty 1

## 2018-10-11 MED ORDER — SODIUM CHLORIDE 0.9 % IV SOLN
INTRAVENOUS | Status: DC
  Administered 2018-10-11: 06:00:00 via INTRAVENOUS

## 2018-10-11 MED ORDER — ACETAMINOPHEN 325 MG PO TABS
650.0000 mg | ORAL_TABLET | Freq: Four times a day (QID) | ORAL | Status: DC | PRN
  Administered 2018-10-13 (×2): 650 mg via ORAL
  Filled 2018-10-11 (×2): qty 2

## 2018-10-11 MED ORDER — ENOXAPARIN SODIUM 40 MG/0.4ML ~~LOC~~ SOLN
40.0000 mg | SUBCUTANEOUS | Status: DC
  Administered 2018-10-11 – 2018-10-13 (×3): 40 mg via SUBCUTANEOUS
  Filled 2018-10-11 (×3): qty 0.4

## 2018-10-11 MED ORDER — PANTOPRAZOLE SODIUM 40 MG PO TBEC
40.0000 mg | DELAYED_RELEASE_TABLET | Freq: Every day | ORAL | Status: DC
  Administered 2018-10-11 – 2018-10-13 (×3): 40 mg via ORAL
  Filled 2018-10-11 (×3): qty 1

## 2018-10-11 MED ORDER — KETOROLAC TROMETHAMINE 15 MG/ML IJ SOLN
15.0000 mg | Freq: Four times a day (QID) | INTRAMUSCULAR | Status: DC
  Administered 2018-10-11 – 2018-10-12 (×3): 15 mg via INTRAVENOUS
  Filled 2018-10-11 (×4): qty 1

## 2018-10-11 MED ORDER — HYDROMORPHONE HCL 1 MG/ML IJ SOLN
1.0000 mg | INTRAMUSCULAR | Status: DC | PRN
  Administered 2018-10-11 (×2): 1 mg via INTRAVENOUS
  Filled 2018-10-11 (×2): qty 1

## 2018-10-11 MED ORDER — HYDROCODONE-ACETAMINOPHEN 5-325 MG PO TABS
1.0000 | ORAL_TABLET | Freq: Four times a day (QID) | ORAL | Status: DC | PRN
  Administered 2018-10-11 – 2018-10-12 (×4): 2 via ORAL
  Filled 2018-10-11 (×4): qty 2

## 2018-10-11 MED ORDER — LIDOCAINE VISCOUS HCL 2 % MT SOLN
15.0000 mL | Freq: Four times a day (QID) | OROMUCOSAL | Status: DC | PRN
  Filled 2018-10-11: qty 15

## 2018-10-11 NOTE — Plan of Care (Signed)

## 2018-10-11 NOTE — Plan of Care (Signed)
  Problem: Education: Goal: Knowledge of General Education information will improve Description: Including pain rating scale, medication(s)/side effects and non-pharmacologic comfort measures 10/11/2018 2057 by Claire Shown, RN Outcome: Progressing   Problem: Clinical Measurements: Goal: Ability to maintain clinical measurements within normal limits will improve Outcome: Progressing   Problem: Activity: Goal: Risk for activity intolerance will decrease Outcome: Progressing   Problem: Nutrition: Goal: Adequate nutrition will be maintained Outcome: Progressing   Problem: Pain Managment: Goal: General experience of comfort will improve 10/11/2018 2057 by Claire Shown, RN Outcome: Progressing 10/11/2018 [place] by Claire Shown, RN Outcome: Progressing

## 2018-10-11 NOTE — Consult Note (Signed)
The patient can light his a 38 year old female whom I had operated on a little over a week ago.  She underwent an anterior lumbar interbody decompression and arthrodesis at the level of L4-L5.  Tolerated surgery well however earlier this week she contacted me and sent a picture of her incision which appeared to have an acute inflammatory reaction around the edges most consistent with a reaction to the Dermabond.  I advised that she remove the Dermabond by using a ointment to help loosen the glue.  She was also started on Bactrim DS by her primary care physician.  She had continued significant pain in the area of the incision radiating to her back and she also notes that she has had some chills though no fever has been documented.  She is admitted by the hospitalist now and started on IV vancomycin and cefepime for cellulitis.  Blood cultures have been drawn and they are pending.  She has been complaining of increasing pain.  On examination there is a small area of skin discoloration measuring approximately 8 mm from the edge of the incision on either side of it there is no frank induration however there is evidence of significant cellulitis that has a dependent quality with increased inflammation of the skin below the level of the incision and even across the area of her thigh.  No drainage from the incision is noted.  At this time I believe she should continue the antibiotics until cultures demonstrate themselves are there is evidence of some defervesced since.  I also would like to order a sed rate and CRP and we can start some Toradol to help control her pain better.  I will put in these orders at the current time.  I will see the patient on Monday.

## 2018-10-11 NOTE — Progress Notes (Signed)
PROGRESS NOTE                                                                                                                                                                                                             Patient Demographics:    Laura Shah, is a 38 y.o. female, DOB - Sep 29, 1980, ZOX:[id number]  Admit date - 10/10/2018   Admitting Physician John GiovanniVasundhra Rathore, MD  Outpatient Primary MD for the patient is Sigmund HazelMiller, Lisa, MD  LOS - 0  Chief Complaint  Patient presents with   Post-op Problem        Brief Narrative  Laura Shah is a 38 y.o. female with medical history significant of recent anterior lumbar fusion with abdominal exposure with Dr. Danielle DessElsner and Dr. Arbie CookeyEarly 9/3, history of chronic right leg weakness and numbness, portal venous thrombosis, pancreatitis, sleeve gastrectomy, appendectomy, anemia, anxiety presented to ER on 10/10/2018 with chief complains of abdominal pain around the postop site with some puslike fluid oozing out from time to time, she states this started about 4 days after her surgery.  She contacted her surgeons and was prescribed Bactrim without much improvement.  Hence she came to the ER.  Her only subjective complaint is as above along with some subjective chills.  In the ER CT of chest abdomen pelvis with contrast was unremarkable.  She had no leukocytosis or fever but there was question of mild cellulitis around the postop site and she was admitted.   Subjective:    Laura Merlaishika Gargis today has, No headache, No chest pain, No abdominal pain - No Nausea, No new weakness tingling or numbness, No Cough - SOB. Mild LLQ post op site pain.   Assessment  & Plan :      1.  Postop left lower quadrant abdominal pain and discharge.  Possible mild postop site cellulitis, for now she is on broad-spectrum IV antibiotics, afebrile no leukocytosis.  Will follow blood cultures, tried procalcitonin, have requested Dr. Danielle DessElsner to evaluate the patient as  well.  For now supportive care and monitor.        2.  GERD.  PPI.  3.  Lower lumbar disc disease with some bilateral leg pain and discomfort.  Recent surgery.  Supportive care.  PT eval from tomorrow.    Family Communication  :  None  Code Status :  Full  Disposition Plan  :  Home 2-3 days  Consults  :  N.Surg.  Procedures  :    CT  chest, abdomen and pelvis. 1. No evidence of pulmonary embolism. No acute intrathoracic process. 2. Postsurgical changes of the left low anterior abdominal wall likely related to recent anterior lumbar spinal fusion and interbody spacer placement at L4-5. The presence of fluid and gas near the incision site is nonspecific given the time course since recent surgery. Small amount of overlying skin thickening is expected postoperatively though should correlate with clinical assessment to exclude a cellulitis. No evidence of abdominal wall dehiscence or organized abscess at this time. No intraperitoneal free fluid or gas. 3. Mild circumferential bladder wall thickening is nonspecific, and can be seen with cystitis. Correlate with urinalysis. 4. Postsurgical changes of prior gastric sleeve without evidence of complication. 5. Interval removal of the radiopaque IUD seen on comparison study. 6. Fibroid uterus.    DVT Prophylaxis  :  Lovenox    Lab Results  Component Value Date   PLT 296 10/10/2018    Diet :  Diet Order            Diet Heart Room service appropriate? Yes; Fluid consistency: Thin  Diet effective now               Inpatient Medications Scheduled Meds:  enoxaparin (LOVENOX) injection  40 mg Subcutaneous Q24H   pantoprazole  40 mg Oral QAC breakfast   Continuous Infusions:  ceFEPime (MAXIPIME) IV 2 g (10/11/18 9604)   vancomycin     PRN Meds:.acetaminophen **OR** acetaminophen, alum & mag hydroxide-simeth **AND** lidocaine, diphenhydrAMINE, HYDROcodone-acetaminophen  Antibiotics  :   Anti-infectives (From admission,  onward)   Start     Dose/Rate Route Frequency Ordered Stop   10/11/18 1130  vancomycin (VANCOCIN) IVPB 750 mg/150 ml premix     750 mg 150 mL/hr over 60 Minutes Intravenous Every 12 hours 10/10/18 2237     10/11/18 0730  ceFEPIme (MAXIPIME) 2 g in sodium chloride 0.9 % 100 mL IVPB     2 g 200 mL/hr over 30 Minutes Intravenous Every 8 hours 10/10/18 2237     10/10/18 2245  vancomycin (VANCOCIN) IVPB 1000 mg/200 mL premix     1,000 mg 200 mL/hr over 60 Minutes Intravenous  Once 10/10/18 2232 10/11/18 0027   10/10/18 2245  ceFEPIme (MAXIPIME) 2 g in sodium chloride 0.9 % 100 mL IVPB     2 g 200 mL/hr over 30 Minutes Intravenous  Once 10/10/18 2232 10/10/18 2321          Objective:   Vitals:   10/10/18 2200 10/11/18 0103 10/11/18 0230 10/11/18 0804  BP: 127/68 124/77 (!) 151/69 132/64  Pulse: 91 97 93 83  Resp: 13 (!) 22  16  Temp:   98.7 F (37.1 C) 98.1 F (36.7 C)  TempSrc:   Oral Oral  SpO2: 100% 99% 100% 99%    Wt Readings from Last 3 Encounters:  09/29/18 68.6 kg  08/25/18 70.4 kg  08/19/18 69.9 kg     Intake/Output Summary (Last 24 hours) at 10/11/2018 1131 Last data filed at 10/11/2018 0900 Gross per 24 hour  Intake 240 ml  Output --  Net 240 ml     Physical Exam  Awake Alert, Oriented X 3, No new F.N deficits, Normal affect [place].AT,PERRAL Supple Neck,No JVD, No cervical lymphadenopathy appriciated.  Symmetrical Chest wall movement, Good air movement bilaterally, CTAB RRR,No Gallops,Rubs or new Murmurs, No Parasternal Heave +ve B.Sounds, Abd Soft, No tenderness, No organomegaly appriciated, No rebound - guarding or rigidity.  Left lower abdomen postop site scar  stable, some surrounding cellulitis which appears mild, patient claims that there was some pus oozing earlier. No Cyanosis, Clubbing or edema,      Data Review:    CBC Recent Labs  Lab 10/10/18 1932  WBC 5.9  HGB 11.7*  HCT 38.0  PLT 296  MCV 85.0  MCH 26.2  MCHC 30.8  RDW 14.1   LYMPHSABS 1.7  MONOABS 0.7  EOSABS 0.8*  BASOSABS 0.0    Chemistries  Recent Labs  Lab 10/10/18 1932  NA 137  K 4.2  CL 104  CO2 22  GLUCOSE 94  BUN 11  CREATININE 0.84  CALCIUM 9.4  AST 14*  ALT 11  ALKPHOS 88  BILITOT 0.3   ------------------------------------------------------------------------------------------------------------------ No results for input(s): CHOL, HDL, LDLCALC, TRIG, CHOLHDL, LDLDIRECT in the last 72 hours.  No results found for: HGBA1C ------------------------------------------------------------------------------------------------------------------ No results for input(s): TSH, T4TOTAL, T3FREE, THYROIDAB in the last 72 hours.  Invalid input(s): FREET3 ------------------------------------------------------------------------------------------------------------------ No results for input(s): VITAMINB12, FOLATE, FERRITIN, TIBC, IRON, RETICCTPCT in the last 72 hours.  Coagulation profile No results for input(s): INR, PROTIME in the last 168 hours.  No results for input(s): DDIMER in the last 72 hours.  Cardiac Enzymes No results for input(s): CKMB, TROPONINI, MYOGLOBIN in the last 168 hours.  Invalid input(s): CK ------------------------------------------------------------------------------------------------------------------ No results found for: BNP  Micro Results Recent Results (from the past 240 hour(s))  SARS Coronavirus 2 [facility] order, Performed in [facility] hospital lab) Nasopharyngeal Nasopharyngeal Swab     Status: None   Collection Time: 10/10/18 10:32 PM   Specimen: Nasopharyngeal Swab  Result Value Ref Range Status   SARS Coronavirus 2 NEGATIVE NEGATIVE Final    Comment: (NOTE) If result is NEGATIVE SARS-CoV-2 target nucleic acids are NOT DETECTED. The SARS-CoV-2 RNA is generally detectable in upper and lower  respiratory specimens during the acute phase of infection. The lowest  concentration of SARS-CoV-2 viral copies  this assay can detect is 250  copies / mL. A negative result does not preclude SARS-CoV-2 infection  and should not be used as the sole basis for treatment or other  patient management decisions.  A negative result may occur with  improper specimen collection / handling, submission of specimen other  than nasopharyngeal swab, presence of viral mutation(s) within the  areas targeted by this assay, and inadequate number of viral copies  (<250 copies / mL). A negative result must be combined with clinical  observations, patient history, and epidemiological information. If result is POSITIVE SARS-CoV-2 target nucleic acids are DETECTED. The SARS-CoV-2 RNA is generally detectable in upper and lower  respiratory specimens dur ing the acute phase of infection.  Positive  results are indicative of active infection with SARS-CoV-2.  Clinical  correlation with patient history and other diagnostic information is  necessary to determine patient infection status.  Positive results do  not rule out bacterial infection or co-infection with other viruses. If result is PRESUMPTIVE POSTIVE SARS-CoV-2 nucleic acids MAY BE PRESENT.   A presumptive positive result was obtained on the submitted specimen  and confirmed on repeat testing.  While 2019 novel coronavirus  (SARS-CoV-2) nucleic acids may be present in the submitted sample  additional confirmatory testing may be necessary for epidemiological  and / or clinical management purposes  to differentiate between  SARS-CoV-2 and other Sarbecovirus currently known to infect humans.  If clinically indicated additional testing with an alternate test  methodology [phone]) is advised. The SARS-CoV-2 RNA is generally  detectable in upper  and lower respiratory sp ecimens during the acute  phase of infection. The expected result is Negative. Fact Sheet for Patients:  BoilerBrush.com.cy Fact Sheet for Healthcare  Providers: https://pope.com/ This test is not yet approved or cleared by the [place] FDA and has been authorized for detection and/or diagnosis of SARS-CoV-2 by FDA under an Emergency Use Authorization (EUA).  This EUA will remain in effect (meaning this test can be used) for the duration of the COVID-19 declaration under Section 564(b)(1) of the Act, 21 U.S.C. section 360bbb-3(b)(1), unless the authorization is terminated or revoked sooner. Performed at [facility], [address], [place], [place] [address]     Radiology Reports Dg Lumbar Spine 2-3 Views  Result Date: 10/02/2018 CLINICAL DATA:  Lumbar disc disease. EXAM: LUMBAR SPINE - 2-3 VIEW COMPARISON:  MRI dated 06/26/2018 FINDINGS: AP and lateral C-arm images demonstrate the patient has undergone anterior lumbar fusion at L4-5. Hardware appears in good position. Alignment is normal. IMPRESSION: Anterior lumbar fusion performed at L4-5. Electronically Signed   By: Francene Boyers M.D.   On: 10/02/2018 10:38   Ct Angio Chest Pe W And/or Wo Contrast  Addendum Date: 10/10/2018   ADDENDUM REPORT: 10/10/2018 21:43 ADDENDUM: Impression #6 should include: Small cystic foci at the uterine fundus, nonspecific but could reflect fluid within the endometrial canal or degenerating fibroid. Correlate with patient's menstrual status and consider pelvic ultrasound if felt to be clinically warranted. Electronically Signed   By: Kreg Shropshire M.D.   On: 10/10/2018 21:43   Result Date: 10/10/2018 CLINICAL DATA:  Shortness of breath, abdominal pain, acute generalized, recent surgery and concern for abscess EXAM: CT ANGIOGRAPHY CHEST CT ABDOMEN AND PELVIS WITH CONTRAST TECHNIQUE: Multidetector CT imaging of the chest was performed using the standard protocol during bolus administration of intravenous contrast. Multiplanar CT image reconstructions and MIPs were obtained to evaluate the vascular anatomy. Multidetector CT  imaging of the abdomen and pelvis was performed using the standard protocol during bolus administration of intravenous contrast. CONTRAST:  OMNIPAQUE IOHEXOL 350 MG/ML SOLN COMPARISON:  CT chest, abdomen and pelvis Jun 22, 2017 FINDINGS: CTA CHEST FINDINGS Cardiovascular: Satisfactory opacification of the pulmonary arteries. No pulmonary arterial filling defects are identified. Pulmonary arteries are normal caliber. Normal heart size. No pericardial effusion. The aorta is normal caliber. Normal 3 vessel arch. Major venous structures are unremarkable. Mediastinum/Nodes: No enlarged mediastinal, hilar or axillary lymph nodes. Thyroid gland, trachea, and esophagus demonstrate no significant findings. Lungs/Pleura: No consolidation, features of edema, pneumothorax, or effusion. No suspicious pulmonary nodules or masses. Musculoskeletal: No chest wall abnormality. No acute or significant osseous findings. Review of the MIP images confirms the above findings. CT ABDOMEN and PELVIS FINDINGS Hepatobiliary: No focal liver abnormality is seen. Patient is post cholecystectomy. Slight prominence of the biliary tree likely related to reservoir effect. No calcified intraductal gallstones. Pancreas: Unremarkable. No pancreatic ductal dilatation or surrounding inflammatory changes. Spleen: Normal in size without focal abnormality. Adrenals/Urinary Tract: Adrenal glands are unremarkable. Kidneys are normal, without renal calculi, focal lesion, or hydronephrosis. Mild circumferential bladder wall thickening. Stomach/Bowel: Postsurgical changes from prior gastric sleeve. Duodenal sweep is unremarkable. No small bowel dilatation or wall thickening. The appendix is surgically absent. No colonic dilatation or wall thickening. Vascular/Lymphatic: The aorta is normal caliber. No suspicious or enlarged lymph nodes in the included lymphatic chains. Reproductive: There is been interval removal of the radiopaque IUD seen on comparison  study. The uterus heterogeneously enhances with several foci suggestive of  uterine fibroids. Small cystic focus is seen within the posterior aspect of the uterine fundus. Normal follicles seen in both adnexa. Other: No intraperitoneal free fluid or gas. There are postsurgical changes of the lower anterior abdominal wall to the left of midline with a small lentiform fluid collection measuring 10 mm in maximal thickness along the anterior abdominal wall and few adjacent foci of subcutaneous gas. No abnormal dehiscence of the anterior abdominal wall. No rim enhancing collection is identified. Musculoskeletal: Interval L4-5 anterior spinal fusion with placement of an interbody spacer cage. No other acute osseous abnormality or suspicious osseous lesions. Review of the MIP images confirms the above findings. IMPRESSION: 1. No evidence of pulmonary embolism. No acute intrathoracic process. 2. Postsurgical changes of the left low anterior abdominal wall likely related to recent anterior lumbar spinal fusion and interbody spacer placement at L4-5. The presence of fluid and gas near the incision site is nonspecific given the time course since recent surgery. Small amount of overlying skin thickening is expected postoperatively though should correlate with clinical assessment to exclude a cellulitis. No evidence of abdominal wall dehiscence or organized abscess at this time. No intraperitoneal free fluid or gas. 3. Mild circumferential bladder wall thickening is nonspecific, and can be seen with cystitis. Correlate with urinalysis. 4. Postsurgical changes of prior gastric sleeve without evidence of complication. 5. Interval removal of the radiopaque IUD seen on comparison study. 6. Fibroid uterus. Electronically Signed: By: Lovena Le M.D. On: 10/10/2018 21:39   Ct Abdomen Pelvis W Contrast  Addendum Date: 10/10/2018   ADDENDUM REPORT: 10/10/2018 21:43 ADDENDUM: Impression #6 should include: Small cystic foci at the  uterine fundus, nonspecific but could reflect fluid within the endometrial canal or degenerating fibroid. Correlate with patient's menstrual status and consider pelvic ultrasound if felt to be clinically warranted. Electronically Signed   By: Lovena Le M.D.   On: 10/10/2018 21:43   Result Date: 10/10/2018 CLINICAL DATA:  Shortness of breath, abdominal pain, acute generalized, recent surgery and concern for abscess EXAM: CT ANGIOGRAPHY CHEST CT ABDOMEN AND PELVIS WITH CONTRAST TECHNIQUE: Multidetector CT imaging of the chest was performed using the standard protocol during bolus administration of intravenous contrast. Multiplanar CT image reconstructions and MIPs were obtained to evaluate the vascular anatomy. Multidetector CT imaging of the abdomen and pelvis was performed using the standard protocol during bolus administration of intravenous contrast. CONTRAST:  156mL OMNIPAQUE IOHEXOL 350 MG/ML SOLN COMPARISON:  CT chest, abdomen and pelvis Jun 22, 2017 FINDINGS: CTA CHEST FINDINGS Cardiovascular: Satisfactory opacification of the pulmonary arteries. No pulmonary arterial filling defects are identified. Pulmonary arteries are normal caliber. Normal heart size. No pericardial effusion. The aorta is normal caliber. Normal 3 vessel arch. Major venous structures are unremarkable. Mediastinum/Nodes: No enlarged mediastinal, hilar or axillary lymph nodes. Thyroid gland, trachea, and esophagus demonstrate no significant findings. Lungs/Pleura: No consolidation, features of edema, pneumothorax, or effusion. No suspicious pulmonary nodules or masses. Musculoskeletal: No chest wall abnormality. No acute or significant osseous findings. Review of the MIP images confirms the above findings. CT ABDOMEN and PELVIS FINDINGS Hepatobiliary: No focal liver abnormality is seen. Patient is post cholecystectomy. Slight prominence of the biliary tree likely related to reservoir effect. No calcified intraductal gallstones.  Pancreas: Unremarkable. No pancreatic ductal dilatation or surrounding inflammatory changes. Spleen: Normal in size without focal abnormality. Adrenals/Urinary Tract: Adrenal glands are unremarkable. Kidneys are normal, without renal calculi, focal lesion, or hydronephrosis. Mild circumferential bladder wall thickening. Stomach/Bowel: Postsurgical changes from prior gastric sleeve.  Duodenal sweep is unremarkable. No small bowel dilatation or wall thickening. The appendix is surgically absent. No colonic dilatation or wall thickening. Vascular/Lymphatic: The aorta is normal caliber. No suspicious or enlarged lymph nodes in the included lymphatic chains. Reproductive: There is been interval removal of the radiopaque IUD seen on comparison study. The uterus heterogeneously enhances with several foci suggestive of uterine fibroids. Small cystic focus is seen within the posterior aspect of the uterine fundus. Normal follicles seen in both adnexa. Other: No intraperitoneal free fluid or gas. There are postsurgical changes of the lower anterior abdominal wall to the left of midline with a small lentiform fluid collection measuring 10 mm in maximal thickness along the anterior abdominal wall and few adjacent foci of subcutaneous gas. No abnormal dehiscence of the anterior abdominal wall. No rim enhancing collection is identified. Musculoskeletal: Interval L4-5 anterior spinal fusion with placement of an interbody spacer cage. No other acute osseous abnormality or suspicious osseous lesions. Review of the MIP images confirms the above findings. IMPRESSION: 1. No evidence of pulmonary embolism. No acute intrathoracic process. 2. Postsurgical changes of the left low anterior abdominal wall likely related to recent anterior lumbar spinal fusion and interbody spacer placement at L4-5. The presence of fluid and gas near the incision site is nonspecific given the time course since recent surgery. Small amount of overlying skin  thickening is expected postoperatively though should correlate with clinical assessment to exclude a cellulitis. No evidence of abdominal wall dehiscence or organized abscess at this time. No intraperitoneal free fluid or gas. 3. Mild circumferential bladder wall thickening is nonspecific, and can be seen with cystitis. Correlate with urinalysis. 4. Postsurgical changes of prior gastric sleeve without evidence of complication. 5. Interval removal of the radiopaque IUD seen on comparison study. 6. Fibroid uterus. Electronically Signed: By: Kreg ShropshirePrice  DeHay M.D. On: 10/10/2018 21:39   Dg C-arm 1-60 Min  Result Date: 10/02/2018 CLINICAL DATA:  Lumbar disc disease. EXAM: DG C-ARM 1-60 MIN FLUOROSCOPY TIME:  Fluoroscopy Time:  49 seconds Electronically Signed   By: Francene BoyersJames  Maxwell M.D.   On: 10/02/2018 10:37   Dg Or Local Abdomen  Result Date: 10/02/2018 CLINICAL DATA:  Sponge count. EXAM: OR LOCAL ABDOMEN COMPARISON:  CT abdomen pelvis dated March 16, 2015. FINDINGS: No radiopaque foreign body. Interval L4-L5 ALIF. The bowel gas pattern is normal. No radio-opaque calculi or other significant radiographic abnormality are seen. IMPRESSION: 1.  No radiopaque foreign body identified. These results were called by telephone at the time of interpretation on 10/02/2018 at 10:23 am to [facility], who verbally acknowledged these results. Electronically Signed   By: Obie DredgeWilliam T Derry M.D.   On: 10/02/2018 10:25    Time Spent in minutes  30   Susa RaringPrashant Chane Cowden M.D on 10/11/2018 at 11:31 AM  To page go to [email or web address] - password [email or web address]

## 2018-10-11 NOTE — ED Notes (Signed)
Report given to [facility]. 

## 2018-10-11 NOTE — H&P (Signed)
History and Physical    Laura Shah WPY:[id number] DOB: February 26, 1980 DOA: 10/10/2018  PCP: Kathyrn Lass, MD Patient coming from: [place] [facility]  Chief Complaint: Postop problem  HPI: Laura Shah is a 38 y.o. female with medical history significant of recent anterior lumbar fusion with abdominal exposure with Dr. Ellene Route and Dr. Donnetta Hutching 9/3, history of chronic right leg weakness and numbness, portal venous thrombosis, pancreatitis, sleeve gastrectomy, appendectomy, anemia, anxiety presenting to the ED for evaluation of a postop problem.  Patient states she had recent lumbar spine surgery done over a week ago.  Since returning home she continues to have diffuse abdominal pain which sometimes makes her feel short of breath.  She vomited about a week ago.  About 4 days ago she started noticing clear yellow fluid on her dressing.  She then spoke to her doctor who looked at pictures of the area and prescribed her Bactrim.  She continued to take Bactrim but symptoms did not improve so her doctor advised her to come into the hospital.  She is not having any diarrhea.  Reports having chills over the past 1 week.  No urinary symptoms.  States while waiting at the ED she started having acid reflux and discomfort in her chest because she did not eat anything.  No chest pain/discomfort at present.  She is requesting something to eat.  ED Course: Afebrile and hemodynamically stable.  No leukocytosis.  Lactic acid normal.  Hemoglobin 11.7, was 12.2 at the time of discharge.  ESR borderline elevated, CRP normal.  LFTs normal.  Urine pregnancy test negative.  Blood culture x2 pending.  SARS-CoV-2 test negative.  EKG not suggestive of ACS.  CT angiogram negative for PE or acute intrathoracic process.  CT abdomen pelvis showing postsurgical changes of the left lower anterior abdominal wall related to recent anterior lumbar spinal fusion and interbody spacer placement at L4-5.  No evidence of abdominal wall  dehiscence or abscess.  No intraperitoneal free fluid or gas. Patient received vancomycin, cefepime, and 1 L fluid bolus, Protonix, and Dilaudid  Review of Systems:  All systems reviewed and apart from history of presenting illness, are negative.  Past Medical History:  Diagnosis Date   Anemia    Anxiety    Back pain    GERD (gastroesophageal reflux disease)    Heart valve regurgitation    trivial PR, MR, TR 2014 echo [facility])   Hx of blood clots    on liver - thrombosis right portal vein branches 01/2015    Ileus (HCC)    Interstitial cystitis    Lumbar herniated disc 04/17/2017   L4/L5. Ruptured.   Pancreatitis    Right leg weakness    and numbness    Tachycardia     Past Surgical History:  Procedure Laterality Date   ABDOMINAL EXPOSURE N/A 10/02/2018   Procedure: ABDOMINAL EXPOSURE;  Surgeon: Rosetta Posner, MD;  Location: [facility] OR;  Service: Vascular;  Laterality: N/A;   ANTERIOR LUMBAR FUSION N/A 10/02/2018   Procedure: Lumbar four-five Anterior lumbar interbody;  Surgeon: Kristeen Miss, MD;  Location: [place];  Service: Neurosurgery;  Laterality: N/A;   APPENDECTOMY     CESAREAN SECTION     CHOLECYSTECTOMY     ESOPHAGOGASTRODUODENOSCOPY (EGD) WITH PROPOFOL N/A 03/18/2015   Procedure: ESOPHAGOGASTRODUODENOSCOPY (EGD) WITH PROPOFOL;  Surgeon: Doran Stabler, MD;  Location: [place];  Service: Endoscopy;  Laterality: N/A;   GASTRECTOMY     laparoscopic sleeve gastrectomy 01/27/15  Leg Pain     LUMBAR DISC SURGERY     L4-5 discectomy 05/2017   OVARIAN CYST REMOVAL     Ruptured     reports that she has never smoked. She has never used smokeless tobacco. She reports that she does not drink alcohol or use drugs.  Allergies  Allergen Reactions   Reglan [Metoclopramide] Anxiety   Morphine And Related Itching    Family History  Problem Relation Age of Onset   Breast cancer Maternal Grandmother    Breast cancer Paternal Grandmother     Diabetes Paternal Grandmother    Diabetes Father    Hypertension Father     Prior to Admission medications   Medication Sig Start Date End Date Taking? Authorizing Provider  diphenhydramine-acetaminophen (TYLENOL PM) 25-500 MG TABS tablet Take 1 tablet by mouth at bedtime.    [provider]  HYDROcodone-acetaminophen (NORCO/VICODIN) 5-325 MG tablet Take 1-2 tablets by mouth every 4 (four) hours as needed. 10/03/18   Kristeen Miss, MD  HYDROcodone-acetaminophen (NORCO/VICODIN) 5-325 MG tablet Take 1 tablet by mouth every 4 (four) hours as needed for moderate pain. 10/04/18 10/04/19  Meyran, Ocie Cornfield, NP  meloxicam (MOBIC) 15 MG tablet Take 15 mg by mouth daily.    [provider]  methocarbamol (ROBAXIN) 500 MG tablet Take 1 tablet (500 mg total) by mouth every 6 (six) hours as needed for muscle spasms. 10/03/18   Kristeen Miss, MD  pantoprazole (PROTONIX) 40 MG tablet Take 1 tablet (40 mg total) by mouth daily. Patient taking differently: Take 40 mg by mouth daily before breakfast.  [birth year]   Delora Fuel, MD    Physical Exam: Vitals:   10/10/18 2200 10/11/18 0103 10/11/18 0230 10/11/18 0804  BP: 127/68 124/77 (!) 151/69 132/64  Pulse: 91 97 93 83  Resp: 13 (!) 22  16  Temp:   98.7 F (37.1 C) 98.1 F (36.7 C)  TempSrc:   Oral Oral  SpO2: 100% 99% 100% 99%    Physical Exam  Constitutional: She is oriented to person, place, and time. She appears well-developed and well-nourished. No distress.  Food present at bedside table  HENT:  Head: Normocephalic.  Mouth/Throat: Oropharynx is clear and moist.  Eyes: Right eye exhibits no discharge. Left eye exhibits no discharge.  Neck: Neck supple.  Cardiovascular: Normal rate, regular rhythm and intact distal pulses.  Pulmonary/Chest: Effort normal and breath sounds normal. No respiratory distress. She has no wheezes. She has no rales.  Abdominal: Soft. Bowel sounds are normal. She exhibits distension. There is no  abdominal tenderness. There is no rebound and no guarding.  Left lower quadrant tender to palpation  Musculoskeletal:        General: No edema.  Neurological: She is alert and oriented to person, place, and time.  Skin: Skin is warm and dry. She is not diaphoretic.  Left lower abdominal wall surgical incision site without evidence of dehiscence.  No active drainage.  Erythema noted around incision site.  Please see image.       Labs on Admission: I have personally reviewed following labs and imaging studies  CBC: Recent Labs  Lab 10/10/18 1932  WBC 5.9  NEUTROABS 2.7  HGB 11.7*  HCT 38.0  MCV 85.0  PLT 295   Basic Metabolic Panel: Recent Labs  Lab 10/10/18 1932  NA 137  K 4.2  CL 104  CO2 22  GLUCOSE 94  BUN 11  CREATININE 0.84  CALCIUM 9.4   GFR: Estimated Creatinine  Clearance: 80.8 mL/min (by C-G formula based on SCr of 0.84 mg/dL). Liver Function Tests: Recent Labs  Lab 10/10/18 1932  AST 14*  ALT 11  ALKPHOS 88  BILITOT 0.3  PROT 8.1  ALBUMIN 4.2   No results for input(s): LIPASE, AMYLASE in the last 168 hours. No results for input(s): AMMONIA in the last 168 hours. Coagulation Profile: No results for input(s): INR, PROTIME in the last 168 hours. Cardiac Enzymes: No results for input(s): CKTOTAL, CKMB, CKMBINDEX, TROPONINI in the last 168 hours. BNP (last 3 results) No results for input(s): PROBNP in the last 8760 hours. HbA1C: No results for input(s): HGBA1C in the last 72 hours. CBG: No results for input(s): GLUCAP in the last 168 hours. Lipid Profile: No results for input(s): CHOL, HDL, LDLCALC, TRIG, CHOLHDL, LDLDIRECT in the last 72 hours. Thyroid Function Tests: No results for input(s): TSH, T4TOTAL, FREET4, T3FREE, THYROIDAB in the last 72 hours. Anemia Panel: No results for input(s): VITAMINB12, FOLATE, FERRITIN, TIBC, IRON, RETICCTPCT in the last 72 hours. Urine analysis:    Component Value Date/Time   COLORURINE YELLOW 10/26/2016  1830   APPEARANCEUR CLEAR 10/26/2016 1830   LABSPEC 1.020 10/26/2016 1830   PHURINE 6.0 10/26/2016 1830   GLUCOSEU NEGATIVE 10/26/2016 1830   HGBUR NEGATIVE 10/26/2016 1830   BILIRUBINUR NEGATIVE 10/26/2016 1830   KETONESUR NEGATIVE 10/26/2016 1830   PROTEINUR NEGATIVE 10/26/2016 1830   UROBILINOGEN 1.0 03/16/2014 1910   NITRITE NEGATIVE 10/26/2016 1830   LEUKOCYTESUR SMALL (A) 10/26/2016 1830    Radiological Exams on Admission: Ct Angio Chest Pe W And/or Wo Contrast  Addendum Date: 10/10/2018   ADDENDUM REPORT: 10/10/2018 21:43 ADDENDUM: Impression #6 should include: Small cystic foci at the uterine fundus, nonspecific but could reflect fluid within the endometrial canal or degenerating fibroid. Correlate with patient's menstrual status and consider pelvic ultrasound if felt to be clinically warranted. Electronically Signed   By: Lovena Le M.D.   On: 10/10/2018 21:43   Result Date: 10/10/2018 CLINICAL DATA:  Shortness of breath, abdominal pain, acute generalized, recent surgery and concern for abscess EXAM: CT ANGIOGRAPHY CHEST CT ABDOMEN AND PELVIS WITH CONTRAST TECHNIQUE: Multidetector CT imaging of the chest was performed using the standard protocol during bolus administration of intravenous contrast. Multiplanar CT image reconstructions and MIPs were obtained to evaluate the vascular anatomy. Multidetector CT imaging of the abdomen and pelvis was performed using the standard protocol during bolus administration of intravenous contrast. CONTRAST:  180m OMNIPAQUE IOHEXOL 350 MG/ML SOLN COMPARISON:  CT chest, abdomen and pelvis Jun 22, 2017 FINDINGS: CTA CHEST FINDINGS Cardiovascular: Satisfactory opacification of the pulmonary arteries. No pulmonary arterial filling defects are identified. Pulmonary arteries are normal caliber. Normal heart size. No pericardial effusion. The aorta is normal caliber. Normal 3 vessel arch. Major venous structures are unremarkable. Mediastinum/Nodes: No  enlarged mediastinal, hilar or axillary lymph nodes. Thyroid gland, trachea, and esophagus demonstrate no significant findings. Lungs/Pleura: No consolidation, features of edema, pneumothorax, or effusion. No suspicious pulmonary nodules or masses. Musculoskeletal: No chest wall abnormality. No acute or significant osseous findings. Review of the MIP images confirms the above findings. CT ABDOMEN and PELVIS FINDINGS Hepatobiliary: No focal liver abnormality is seen. Patient is post cholecystectomy. Slight prominence of the biliary tree likely related to reservoir effect. No calcified intraductal gallstones. Pancreas: Unremarkable. No pancreatic ductal dilatation or surrounding inflammatory changes. Spleen: Normal in size without focal abnormality. Adrenals/Urinary Tract: Adrenal glands are unremarkable. Kidneys are normal, without renal calculi, focal lesion, or hydronephrosis. Mild  circumferential bladder wall thickening. Stomach/Bowel: Postsurgical changes from prior gastric sleeve. Duodenal sweep is unremarkable. No small bowel dilatation or wall thickening. The appendix is surgically absent. No colonic dilatation or wall thickening. Vascular/Lymphatic: The aorta is normal caliber. No suspicious or enlarged lymph nodes in the included lymphatic chains. Reproductive: There is been interval removal of the radiopaque IUD seen on comparison study. The uterus heterogeneously enhances with several foci suggestive of uterine fibroids. Small cystic focus is seen within the posterior aspect of the uterine fundus. Normal follicles seen in both adnexa. Other: No intraperitoneal free fluid or gas. There are postsurgical changes of the lower anterior abdominal wall to the left of midline with a small lentiform fluid collection measuring 10 mm in maximal thickness along the anterior abdominal wall and few adjacent foci of subcutaneous gas. No abnormal dehiscence of the anterior abdominal wall. No rim enhancing collection is  identified. Musculoskeletal: Interval L4-5 anterior spinal fusion with placement of an interbody spacer cage. No other acute osseous abnormality or suspicious osseous lesions. Review of the MIP images confirms the above findings. IMPRESSION: 1. No evidence of pulmonary embolism. No acute intrathoracic process. 2. Postsurgical changes of the left low anterior abdominal wall likely related to recent anterior lumbar spinal fusion and interbody spacer placement at L4-5. The presence of fluid and gas near the incision site is nonspecific given the time course since recent surgery. Small amount of overlying skin thickening is expected postoperatively though should correlate with clinical assessment to exclude a cellulitis. No evidence of abdominal wall dehiscence or organized abscess at this time. No intraperitoneal free fluid or gas. 3. Mild circumferential bladder wall thickening is nonspecific, and can be seen with cystitis. Correlate with urinalysis. 4. Postsurgical changes of prior gastric sleeve without evidence of complication. 5. Interval removal of the radiopaque IUD seen on comparison study. 6. Fibroid uterus. Electronically Signed: By: Lovena Le M.D. On: 10/10/2018 21:39   Ct Abdomen Pelvis W Contrast  Addendum Date: 10/10/2018   ADDENDUM REPORT: 10/10/2018 21:43 ADDENDUM: Impression #6 should include: Small cystic foci at the uterine fundus, nonspecific but could reflect fluid within the endometrial canal or degenerating fibroid. Correlate with patient's menstrual status and consider pelvic ultrasound if felt to be clinically warranted. Electronically Signed   By: Lovena Le M.D.   On: 10/10/2018 21:43   Result Date: 10/10/2018 CLINICAL DATA:  Shortness of breath, abdominal pain, acute generalized, recent surgery and concern for abscess EXAM: CT ANGIOGRAPHY CHEST CT ABDOMEN AND PELVIS WITH CONTRAST TECHNIQUE: Multidetector CT imaging of the chest was performed using the standard protocol during  bolus administration of intravenous contrast. Multiplanar CT image reconstructions and MIPs were obtained to evaluate the vascular anatomy. Multidetector CT imaging of the abdomen and pelvis was performed using the standard protocol during bolus administration of intravenous contrast. CONTRAST:  135m OMNIPAQUE IOHEXOL 350 MG/ML SOLN COMPARISON:  CT chest, abdomen and pelvis Jun 22, 2017 FINDINGS: CTA CHEST FINDINGS Cardiovascular: Satisfactory opacification of the pulmonary arteries. No pulmonary arterial filling defects are identified. Pulmonary arteries are normal caliber. Normal heart size. No pericardial effusion. The aorta is normal caliber. Normal 3 vessel arch. Major venous structures are unremarkable. Mediastinum/Nodes: No enlarged mediastinal, hilar or axillary lymph nodes. Thyroid gland, trachea, and esophagus demonstrate no significant findings. Lungs/Pleura: No consolidation, features of edema, pneumothorax, or effusion. No suspicious pulmonary nodules or masses. Musculoskeletal: No chest wall abnormality. No acute or significant osseous findings. Review of the MIP images confirms the above findings. CT ABDOMEN and  PELVIS FINDINGS Hepatobiliary: No focal liver abnormality is seen. Patient is post cholecystectomy. Slight prominence of the biliary tree likely related to reservoir effect. No calcified intraductal gallstones. Pancreas: Unremarkable. No pancreatic ductal dilatation or surrounding inflammatory changes. Spleen: Normal in size without focal abnormality. Adrenals/Urinary Tract: Adrenal glands are unremarkable. Kidneys are normal, without renal calculi, focal lesion, or hydronephrosis. Mild circumferential bladder wall thickening. Stomach/Bowel: Postsurgical changes from prior gastric sleeve. Duodenal sweep is unremarkable. No small bowel dilatation or wall thickening. The appendix is surgically absent. No colonic dilatation or wall thickening. Vascular/Lymphatic: The aorta is normal caliber. No  suspicious or enlarged lymph nodes in the included lymphatic chains. Reproductive: There is been interval removal of the radiopaque IUD seen on comparison study. The uterus heterogeneously enhances with several foci suggestive of uterine fibroids. Small cystic focus is seen within the posterior aspect of the uterine fundus. Normal follicles seen in both adnexa. Other: No intraperitoneal free fluid or gas. There are postsurgical changes of the lower anterior abdominal wall to the left of midline with a small lentiform fluid collection measuring 10 mm in maximal thickness along the anterior abdominal wall and few adjacent foci of subcutaneous gas. No abnormal dehiscence of the anterior abdominal wall. No rim enhancing collection is identified. Musculoskeletal: Interval L4-5 anterior spinal fusion with placement of an interbody spacer cage. No other acute osseous abnormality or suspicious osseous lesions. Review of the MIP images confirms the above findings. IMPRESSION: 1. No evidence of pulmonary embolism. No acute intrathoracic process. 2. Postsurgical changes of the left low anterior abdominal wall likely related to recent anterior lumbar spinal fusion and interbody spacer placement at L4-5. The presence of fluid and gas near the incision site is nonspecific given the time course since recent surgery. Small amount of overlying skin thickening is expected postoperatively though should correlate with clinical assessment to exclude a cellulitis. No evidence of abdominal wall dehiscence or organized abscess at this time. No intraperitoneal free fluid or gas. 3. Mild circumferential bladder wall thickening is nonspecific, and can be seen with cystitis. Correlate with urinalysis. 4. Postsurgical changes of prior gastric sleeve without evidence of complication. 5. Interval removal of the radiopaque IUD seen on comparison study. 6. Fibroid uterus. Electronically Signed: By: Lovena Le M.D. On: 10/10/2018 21:39    EKG:  Independently reviewed.  Sinus rhythm, heart rate 57.  Assessment/Plan Principal Problem:   Cellulitis Active Problems:   GERD (gastroesophageal reflux disease)   Abdominal wall cellulitis around surgical incision site Afebrile and hemodynamically stable.  No leukocytosis.  Lactic acid normal. CT abdomen pelvis showing postsurgical changes of the left lower anterior abdominal wall related to recent anterior lumbar spinal fusion and interbody spacer placement at L4-5.  No evidence of abdominal wall dehiscence or abscess.  No intraperitoneal free fluid or gas. -Continue vancomycin and cefepime at this time -Received Dilaudid in the ED for abdominal pain.  Currently appears comfortable and tolerating p.o. intake.  Norco PRN, Tylenol PRN. -Blood culture x2 pending  GERD -Protonix -GI cocktail PRN  DVT prophylaxis: Lovenox Code Status: Full code Family Communication: No family available. Disposition Plan: Anticipate discharge after clinical improvement. Consults called: None Admission status: It is my clinical opinion that referral for OBSERVATION is reasonable and necessary in this patient based on the above information provided. The aforementioned taken together are felt to place the patient at high risk for further clinical deterioration. However it is anticipated that the patient may be medically stable for discharge from the hospital within  24 to 48 hours.  The medical decision making on this patient was of high complexity and the patient is at high risk for clinical deterioration, therefore this is a level 3 visit.  Shela Leff MD [facility] Pager [phone]  If 7PM-7AM, please contact night-coverage [email or web address] Password [email or web address]  10/11/2018, 8:16 AM

## 2018-10-11 NOTE — Plan of Care (Signed)
  Problem: Health Behavior/Discharge Planning: Goal: Ability to manage health-related needs will improve Outcome: Progressing   Problem: Activity: Goal: Risk for activity intolerance will decrease Outcome: Progressing   Problem: Nutrition: Goal: Adequate nutrition will be maintained Outcome: Progressing   Problem: Coping: Goal: Level of anxiety will decrease Outcome: Progressing   Problem: Elimination: Goal: Will not experience complications related to bowel motility Outcome: Progressing   Problem: Pain Managment: Goal: General experience of comfort will improve Outcome: Progressing   Problem: Safety: Goal: Ability to remain free from injury will improve Outcome: Progressing   Problem: Skin Integrity: Goal: Risk for impaired skin integrity will decrease Outcome: Progressing   

## 2018-10-11 NOTE — ED Provider Notes (Signed)
[facility] EMERGENCY DEPARTMENT Provider Note   CSN: [id number] Arrival date & time: 10/10/18  1636     History   Chief Complaint Chief Complaint  Patient presents with   Post-op Problem    HPI Laura Shah is a 38 y.o. female.     HPI   38 year old female with a history of recent anterior lumbar fusion with abdominal exposure with Dr. Danielle Dess and Dr. early 9/3, history of chronic right leg weakness and numbness, DVT and portal venous thrombosis, pancreatitis, sleeve gastrectomy, appendectomy, presents with concern for worsening erythema around her incision despite taking bactrim and worsening abdominal pain.  Reports that she had chills beginning over the weekend, had around Tuesday noticed redness developing around her abdominal incision,.  No pain.  Reports she had seen her primary care physician for a regular scheduled wellness visit, however they discussed erythema that had just developed over her abdomen, and she had placed her on Bactrim with concern for cellulitis.  Patient also reports that she removed the Dermabond around her incision she thought it may have been an allergic reaction.  She does not know of any fevers but reports she has had chills.  No urinary symptoms.  Reports redness has been getting worse.  Reports diffuse abdominal pain.  She has had 1 bowel movement since surgery.  Has had some nausea: No vomiting.  Denies cough.  She does report that she has had some shortness of breath on exertion as well as waking up short of breath in the middle the night last night.  Reports he does have a history of blood clots.  Reports has had back pain since surgery, no new numbness/weakness.    Past Medical History:  Diagnosis Date   Anemia    Anxiety    Back pain    GERD (gastroesophageal reflux disease)    Heart valve regurgitation    trivial PR, MR, TR 2014 echo [facility])   Hx of blood clots    on liver - thrombosis right portal vein  branches 01/2015    Ileus (HCC)    Interstitial cystitis    Lumbar herniated disc 04/17/2017   L4/L5. Ruptured.   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   Epigastric abdominal pain    RUQ pain    Elevated LFTs    Irritable bowel syndrome with constipation    Injury of hand, right 08/03/2014   Interstitial cystitis    Tachycardia     Past Surgical History:  Procedure Laterality Date   ABDOMINAL EXPOSURE N/A 10/02/2018   Procedure: ABDOMINAL EXPOSURE;  Surgeon: Larina Earthly, MD;  Location: [facility] OR;  Service: Vascular;  Laterality: N/A;   ANTERIOR LUMBAR FUSION N/A 10/02/2018   Procedure: Lumbar four-five Anterior lumbar interbody;  Surgeon: Barnett Abu, MD;  Location: [facility] OR;  Service: Neurosurgery;  Laterality: N/A;   APPENDECTOMY     CESAREAN SECTION     CHOLECYSTECTOMY     ESOPHAGOGASTRODUODENOSCOPY (EGD) WITH PROPOFOL  N/A 03/18/2015   Procedure: ESOPHAGOGASTRODUODENOSCOPY (EGD) WITH PROPOFOL;  Surgeon: Sherrilyn Rist, MD;  Location: [facility] ENDOSCOPY;  Service: Endoscopy;  Laterality: N/A;   GASTRECTOMY     laparoscopic sleeve gastrectomy 01/27/15   Leg Pain     LUMBAR DISC SURGERY     L4-5 discectomy 05/2017   OVARIAN CYST REMOVAL     Ruptured     OB History   No obstetric history on file.      Home Medications    Prior to Admission medications   Medication Sig Start Date End Date Taking? Authorizing Provider  diphenhydramine-acetaminophen (TYLENOL PM) 25-500 MG  TABS tablet Take 1 tablet by mouth at bedtime.    [provider]  HYDROcodone-acetaminophen (NORCO/VICODIN) 5-325 MG tablet Take 1-2 tablets by mouth every 4 (four) hours as needed. 10/03/18   Barnett Abu, MD  HYDROcodone-acetaminophen (NORCO/VICODIN) 5-325 MG tablet Take 1 tablet by mouth every 4 (four) hours as needed for moderate pain. 10/04/18 10/04/19  Meyran, Tiana Loft, NP  meloxicam (MOBIC) 15 MG tablet Take 15 mg by mouth daily.    [provider]  methocarbamol (ROBAXIN) 500 MG tablet Take 1 tablet (500 mg total) by mouth every 6 (six) hours as needed for muscle spasms. 10/03/18   Barnett Abu, MD  pantoprazole (PROTONIX) 40 MG tablet Take 1 tablet (40 mg total) by mouth daily. Patient taking differently: Take 40 mg by mouth daily before breakfast.  08/28/14   Dione Booze, MD    Family History Family History  Problem Relation Age of Onset   Breast cancer Maternal Grandmother    Breast cancer Paternal Grandmother    Diabetes Paternal Grandmother    Diabetes Father    Hypertension Father     Social History Social History   Tobacco Use   Smoking status: Never Smoker   Smokeless tobacco: Never Used  Substance Use Topics   Alcohol use: No    Alcohol/week: 0.0 standard drinks    Frequency: Never   Drug use: No     Allergies   Reglan [metoclopramide] and Morphine and related   Review of Systems Review of Systems  Constitutional: Positive for chills. Negative for fever.  Eyes: Negative for visual disturbance.  Respiratory: Positive for shortness of breath. Negative for cough.   Cardiovascular: Negative for chest pain.  Gastrointestinal: Positive for abdominal pain and nausea. Negative for constipation, diarrhea and vomiting.  Genitourinary: Negative for dysuria.  Musculoskeletal: Negative for back pain.  Skin: Positive for rash and wound.  Neurological: Negative for headaches.     Physical Exam Updated Vital Signs BP 127/68    Pulse  91    Temp 98.3 F (36.8 C) (Oral)    Resp 13    LMP 10/05/2018    SpO2 100%   Physical Exam Vitals signs and nursing note reviewed.  Constitutional:      General: She is not in acute distress.    Appearance: She is well-developed. She is not diaphoretic.  HENT:     Head: Normocephalic and atraumatic.  Eyes:     Conjunctiva/sclera: Conjunctivae normal.  Neck:     Musculoskeletal: Normal range of motion.  Cardiovascular:     Rate and Rhythm: Normal rate and regular rhythm.  Pulmonary:     Effort: Pulmonary effort is normal. No respiratory distress.  Abdominal:     General: There is no distension.     Palpations: Abdomen is soft.     Tenderness: There is abdominal tenderness. There is  no guarding.     Comments: Incision, clean, dry, intact, erythema surrounding incision  Musculoskeletal:        General: No tenderness.  Skin:    General: Skin is warm and dry.     Findings: No erythema or rash.  Neurological:     Mental Status: She is alert and oriented to person, place, and time.      ED Treatments / Results  Labs (all labs ordered are listed, but only abnormal results are displayed) Labs Reviewed  CBC WITH DIFFERENTIAL/PLATELET - Abnormal; Notable for the following components:      Result Value   Hemoglobin 11.7 (*)    Eosinophils Absolute 0.8 (*)    All other components within normal limits  COMPREHENSIVE METABOLIC PANEL - Abnormal; Notable for the following components:   AST 14 (*)    All other components within normal limits  SEDIMENTATION RATE - Abnormal; Notable for the following components:   Sed Rate 25 (*)    All other components within normal limits  SARS CORONAVIRUS 2 (HOSPITAL ORDER, Reader LAB)  CULTURE, BLOOD (ROUTINE X 2)  CULTURE, BLOOD (ROUTINE X 2)  C-REACTIVE PROTEIN  PREGNANCY, URINE  LACTIC ACID, PLASMA  LACTIC ACID, PLASMA    EKG None  Radiology Ct Angio Chest Pe W And/or Wo Contrast  Addendum Date:  10/10/2018   ADDENDUM REPORT: 10/10/2018 21:43 ADDENDUM: Impression #6 should include: Small cystic foci at the uterine fundus, nonspecific but could reflect fluid within the endometrial canal or degenerating fibroid. Correlate with patient's menstrual status and consider pelvic ultrasound if felt to be clinically warranted. Electronically Signed   By: Lovena Le M.D.   On: 10/10/2018 21:43   Result Date: 10/10/2018 CLINICAL DATA:  Shortness of breath, abdominal pain, acute generalized, recent surgery and concern for abscess EXAM: CT ANGIOGRAPHY CHEST CT ABDOMEN AND PELVIS WITH CONTRAST TECHNIQUE: Multidetector CT imaging of the chest was performed using the standard protocol during bolus administration of intravenous contrast. Multiplanar CT image reconstructions and MIPs were obtained to evaluate the vascular anatomy. Multidetector CT imaging of the abdomen and pelvis was performed using the standard protocol during bolus administration of intravenous contrast. CONTRAST:  166mL OMNIPAQUE IOHEXOL 350 MG/ML SOLN COMPARISON:  CT chest, abdomen and pelvis Jun 22, 2017 FINDINGS: CTA CHEST FINDINGS Cardiovascular: Satisfactory opacification of the pulmonary arteries. No pulmonary arterial filling defects are identified. Pulmonary arteries are normal caliber. Normal heart size. No pericardial effusion. The aorta is normal caliber. Normal 3 vessel arch. Major venous structures are unremarkable. Mediastinum/Nodes: No enlarged mediastinal, hilar or axillary lymph nodes. Thyroid gland, trachea, and esophagus demonstrate no significant findings. Lungs/Pleura: No consolidation, features of edema, pneumothorax, or effusion. No suspicious pulmonary nodules or masses. Musculoskeletal: No chest wall abnormality. No acute or significant osseous findings. Review of the MIP images confirms the above findings. CT ABDOMEN and PELVIS FINDINGS Hepatobiliary: No focal liver abnormality is seen. Patient is post cholecystectomy. Slight  prominence of the biliary tree likely related to reservoir effect. No calcified intraductal gallstones. Pancreas: Unremarkable. No pancreatic ductal dilatation or surrounding inflammatory changes. Spleen: Normal in size without focal abnormality. Adrenals/Urinary Tract: Adrenal glands are unremarkable. Kidneys are normal, without renal calculi, focal lesion, or hydronephrosis. Mild circumferential bladder wall thickening. Stomach/Bowel: Postsurgical changes from prior gastric sleeve. Duodenal sweep is unremarkable. No small bowel dilatation or wall thickening. The appendix is surgically absent. No colonic dilatation or wall thickening. Vascular/Lymphatic: The aorta is normal caliber. No suspicious or enlarged  lymph nodes in the included lymphatic chains. Reproductive: There is been interval removal of the radiopaque IUD seen on comparison study. The uterus heterogeneously enhances with several foci suggestive of uterine fibroids. Small cystic focus is seen within the posterior aspect of the uterine fundus. Normal follicles seen in both adnexa. Other: No intraperitoneal free fluid or gas. There are postsurgical changes of the lower anterior abdominal wall to the left of midline with a small lentiform fluid collection measuring 10 mm in maximal thickness along the anterior abdominal wall and few adjacent foci of subcutaneous gas. No abnormal dehiscence of the anterior abdominal wall. No rim enhancing collection is identified. Musculoskeletal: Interval L4-5 anterior spinal fusion with placement of an interbody spacer cage. No other acute osseous abnormality or suspicious osseous lesions. Review of the MIP images confirms the above findings. IMPRESSION: 1. No evidence of pulmonary embolism. No acute intrathoracic process. 2. Postsurgical changes of the left low anterior abdominal wall likely related to recent anterior lumbar spinal fusion and interbody spacer placement at L4-5. The presence of fluid and gas near the  incision site is nonspecific given the time course since recent surgery. Small amount of overlying skin thickening is expected postoperatively though should correlate with clinical assessment to exclude a cellulitis. No evidence of abdominal wall dehiscence or organized abscess at this time. No intraperitoneal free fluid or gas. 3. Mild circumferential bladder wall thickening is nonspecific, and can be seen with cystitis. Correlate with urinalysis. 4. Postsurgical changes of prior gastric sleeve without evidence of complication. 5. Interval removal of the radiopaque IUD seen on comparison study. 6. Fibroid uterus. Electronically Signed: By: Kreg ShropshirePrice  DeHay M.D. On: 10/10/2018 21:39   Ct Abdomen Pelvis W Contrast  Addendum Date: 10/10/2018   ADDENDUM REPORT: 10/10/2018 21:43 ADDENDUM: Impression #6 should include: Small cystic foci at the uterine fundus, nonspecific but could reflect fluid within the endometrial canal or degenerating fibroid. Correlate with patient's menstrual status and consider pelvic ultrasound if felt to be clinically warranted. Electronically Signed   By: Kreg ShropshirePrice  DeHay M.D.   On: 10/10/2018 21:43   Result Date: 10/10/2018 CLINICAL DATA:  Shortness of breath, abdominal pain, acute generalized, recent surgery and concern for abscess EXAM: CT ANGIOGRAPHY CHEST CT ABDOMEN AND PELVIS WITH CONTRAST TECHNIQUE: Multidetector CT imaging of the chest was performed using the standard protocol during bolus administration of intravenous contrast. Multiplanar CT image reconstructions and MIPs were obtained to evaluate the vascular anatomy. Multidetector CT imaging of the abdomen and pelvis was performed using the standard protocol during bolus administration of intravenous contrast. CONTRAST:  100mL OMNIPAQUE IOHEXOL 350 MG/ML SOLN COMPARISON:  CT chest, abdomen and pelvis Jun 22, 2017 FINDINGS: CTA CHEST FINDINGS Cardiovascular: Satisfactory opacification of the pulmonary arteries. No pulmonary arterial  filling defects are identified. Pulmonary arteries are normal caliber. Normal heart size. No pericardial effusion. The aorta is normal caliber. Normal 3 vessel arch. Major venous structures are unremarkable. Mediastinum/Nodes: No enlarged mediastinal, hilar or axillary lymph nodes. Thyroid gland, trachea, and esophagus demonstrate no significant findings. Lungs/Pleura: No consolidation, features of edema, pneumothorax, or effusion. No suspicious pulmonary nodules or masses. Musculoskeletal: No chest wall abnormality. No acute or significant osseous findings. Review of the MIP images confirms the above findings. CT ABDOMEN and PELVIS FINDINGS Hepatobiliary: No focal liver abnormality is seen. Patient is post cholecystectomy. Slight prominence of the biliary tree likely related to reservoir effect. No calcified intraductal gallstones. Pancreas: Unremarkable. No pancreatic ductal dilatation or surrounding inflammatory changes. Spleen: Normal in size without  focal abnormality. Adrenals/Urinary Tract: Adrenal glands are unremarkable. Kidneys are normal, without renal calculi, focal lesion, or hydronephrosis. Mild circumferential bladder wall thickening. Stomach/Bowel: Postsurgical changes from prior gastric sleeve. Duodenal sweep is unremarkable. No small bowel dilatation or wall thickening. The appendix is surgically absent. No colonic dilatation or wall thickening. Vascular/Lymphatic: The aorta is normal caliber. No suspicious or enlarged lymph nodes in the included lymphatic chains. Reproductive: There is been interval removal of the radiopaque IUD seen on comparison study. The uterus heterogeneously enhances with several foci suggestive of uterine fibroids. Small cystic focus is seen within the posterior aspect of the uterine fundus. Normal follicles seen in both adnexa. Other: No intraperitoneal free fluid or gas. There are postsurgical changes of the lower anterior abdominal wall to the left of midline with a small  lentiform fluid collection measuring 10 mm in maximal thickness along the anterior abdominal wall and few adjacent foci of subcutaneous gas. No abnormal dehiscence of the anterior abdominal wall. No rim enhancing collection is identified. Musculoskeletal: Interval L4-5 anterior spinal fusion with placement of an interbody spacer cage. No other acute osseous abnormality or suspicious osseous lesions. Review of the MIP images confirms the above findings. IMPRESSION: 1. No evidence of pulmonary embolism. No acute intrathoracic process. 2. Postsurgical changes of the left low anterior abdominal wall likely related to recent anterior lumbar spinal fusion and interbody spacer placement at L4-5. The presence of fluid and gas near the incision site is nonspecific given the time course since recent surgery. Small amount of overlying skin thickening is expected postoperatively though should correlate with clinical assessment to exclude a cellulitis. No evidence of abdominal wall dehiscence or organized abscess at this time. No intraperitoneal free fluid or gas. 3. Mild circumferential bladder wall thickening is nonspecific, and can be seen with cystitis. Correlate with urinalysis. 4. Postsurgical changes of prior gastric sleeve without evidence of complication. 5. Interval removal of the radiopaque IUD seen on comparison study. 6. Fibroid uterus. Electronically Signed: By: Kreg ShropshirePrice  DeHay M.D. On: 10/10/2018 21:39    Procedures Angiocath insertion  Date/Time: 10/11/2018 12:17 AM Performed by: Alvira MondaySchlossman, Tavaras Goody, MD Authorized by: Alvira MondaySchlossman, Lindsey Demonte, MD  Consent: Verbal consent obtained. Required items: required blood products, implants, devices, and special equipment available Patient identity confirmed: verbally with patient Time out: Immediately prior to procedure a "time out" was called to verify the correct patient, procedure, equipment, support staff and site/side marked as required. Preparation: Patient was prepped  and draped in the usual sterile fashion. Local anesthesia used: no  Anesthesia: Local anesthesia used: no  Sedation: Patient sedated: no  Patient tolerance: patient tolerated the procedure well with no immediate complications    (including critical care time)  Medications Ordered in ED Medications  vancomycin (VANCOCIN) IVPB 1000 mg/200 mL premix (1,000 mg Intravenous New Bag/Given 10/10/18 2327)  ceFEPIme (MAXIPIME) 2 g in sodium chloride 0.9 % 100 mL IVPB (has no administration in time range)  vancomycin (VANCOCIN) IVPB 750 mg/150 ml premix (has no administration in time range)  HYDROmorphone (DILAUDID) injection 1 mg (1 mg Intravenous Given 10/10/18 1952)  sodium chloride 0.9 % bolus 1,000 mL (0 mLs Intravenous Stopped 10/10/18 2128)  iohexol (OMNIPAQUE) 350 MG/ML injection 100 mL (100 mLs Intravenous Contrast Given 10/10/18 2040)  ceFEPIme (MAXIPIME) 2 g in sodium chloride 0.9 % 100 mL IVPB (0 g Intravenous Stopped 10/10/18 2321)  pantoprazole (PROTONIX) EC tablet 40 mg (40 mg Oral Given 10/10/18 2310)     Initial Impression / Assessment and Plan /  ED Course  I have reviewed the triage vital signs and the nursing notes.  Pertinent labs & imaging results that were available during my care of the patient were reviewed by me and considered in my medical decision making (see chart for details).       38 year old female with a history of recent anterior lumbar fusion with abdominal exposure with Dr. Danielle Dess and Dr. early 9/3, history of chronic right leg weakness and numbness, DVT and portal venous thrombosis, pancreatitis, sleeve gastrectomy, appendectomy, presents with concern for worsening erythema around her incision despite taking bactrim and worsening abdominal pain as well as dyspnea.    Given shortness of breath, recent surgery, history of DVT, PE study was done which showed no evidence of pulmonary embolus or other acute intrathoracic findings.  Given worsening abdominal pain,  erythema around her incision with recent surgery, CT abdomen pelvis was ordered which showed no evidence of intra-abdominal abscess or complication, findings likely representing postsurgical findings.  Have not discussed patient with Dr. earlier Dr. Danielle Dess at this time, however will plan admission to [facility] in case she has no improvement or worsening that requires vascular or neurosurgery consult.  Labs within normal limits, both vital signs and labs show no sign of sepsis.  However, given worsening erythema and cellulitis despite taking Bactrim, will admit for IV antibiotics and observation.  Given vancomycin and cefepime and admitted to [facility] for further care.    Final Clinical Impressions(s) / ED Diagnoses   Final diagnoses:  Post-operative pain  Cellulitis of abdominal wall  Shortness of breath    ED Discharge Orders    None       Alvira Monday, MD 10/11/18 [phone]

## 2018-10-11 NOTE — Progress Notes (Signed)
Patient arrived on 5N02 at this time via care link. Pt is alert and oriented x 4. Patient transferred from stretcher to bed. Patient oriented to the unit. Call bell within reach. Will monitor.

## 2018-10-12 LAB — CBC WITH DIFFERENTIAL/PLATELET
Abs Immature Granulocytes: 0.01 10*3/uL (ref 0.00–0.07)
Basophils Absolute: 0 10*3/uL (ref 0.0–0.1)
Basophils Relative: 0 %
Eosinophils Absolute: 0.6 10*3/uL — ABNORMAL HIGH (ref 0.0–0.5)
Eosinophils Relative: 11 %
HCT: 36.6 % (ref 36.0–46.0)
Hemoglobin: 11.5 g/dL — ABNORMAL LOW (ref 12.0–15.0)
Immature Granulocytes: 0 %
Lymphocytes Relative: 27 %
Lymphs Abs: 1.4 10*3/uL (ref 0.7–4.0)
MCH: 26.5 pg (ref 26.0–34.0)
MCHC: 31.4 g/dL (ref 30.0–36.0)
MCV: 84.3 fL (ref 80.0–100.0)
Monocytes Absolute: 0.6 10*3/uL (ref 0.1–1.0)
Monocytes Relative: 12 %
Neutro Abs: 2.4 10*3/uL (ref 1.7–7.7)
Neutrophils Relative %: 50 %
Platelets: 321 10*3/uL (ref 150–400)
RBC: 4.34 MIL/uL (ref 3.87–5.11)
RDW: 14 % (ref 11.5–15.5)
WBC: 5 10*3/uL (ref 4.0–10.5)
nRBC: 0 % (ref 0.0–0.2)

## 2018-10-12 LAB — COMPREHENSIVE METABOLIC PANEL
ALT: 11 U/L (ref 0–44)
AST: 13 U/L — ABNORMAL LOW (ref 15–41)
Albumin: 3.3 g/dL — ABNORMAL LOW (ref 3.5–5.0)
Alkaline Phosphatase: 80 U/L (ref 38–126)
Anion gap: 10 (ref 5–15)
BUN: 9 mg/dL (ref 6–20)
CO2: 22 mmol/L (ref 22–32)
Calcium: 8.9 mg/dL (ref 8.9–10.3)
Chloride: 105 mmol/L (ref 98–111)
Creatinine, Ser: 0.63 mg/dL (ref 0.44–1.00)
GFR calc Af Amer: >60 mL/min (ref >60)
GFR calc non Af Amer: >60 mL/min (ref >60)
Glucose, Bld: 78 mg/dL (ref 70–99)
Potassium: 3.9 mmol/L (ref 3.5–5.1)
Sodium: 137 mmol/L (ref 135–145)
Total Bilirubin: 0.5 mg/dL (ref 0.3–1.2)
Total Protein: 6.6 g/dL (ref 6.5–8.1)

## 2018-10-12 LAB — MAGNESIUM: Magnesium: 2 mg/dL (ref 1.7–2.4)

## 2018-10-12 LAB — PROCALCITONIN: Procalcitonin: <0.1 ng/mL

## 2018-10-12 LAB — C-REACTIVE PROTEIN: CRP: 0.9 mg/dL (ref <1.0)

## 2018-10-12 LAB — URINE CULTURE: Culture: NO GROWTH

## 2018-10-12 LAB — SEDIMENTATION RATE: Sed Rate: 22 mm/hr (ref 0–22)

## 2018-10-12 MED ORDER — KETOROLAC TROMETHAMINE 15 MG/ML IJ SOLN
15.0000 mg | Freq: Three times a day (TID) | INTRAMUSCULAR | Status: AC | PRN
  Administered 2018-10-12 – 2018-10-13 (×3): 15 mg via INTRAVENOUS
  Filled 2018-10-12 (×3): qty 1

## 2018-10-12 MED ORDER — CLOBETASOL PROPIONATE 0.05 % EX CREA
TOPICAL_CREAM | Freq: Two times a day (BID) | CUTANEOUS | Status: DC
  Administered 2018-10-12 – 2018-10-13 (×3): via TOPICAL
  Filled 2018-10-12 (×2): qty 15

## 2018-10-12 MED ORDER — HYDROCODONE-ACETAMINOPHEN 5-325 MG PO TABS
1.0000 | ORAL_TABLET | Freq: Four times a day (QID) | ORAL | Status: DC | PRN
  Administered 2018-10-12 – 2018-10-13 (×4): 1 via ORAL
  Filled 2018-10-12 (×4): qty 1

## 2018-10-12 NOTE — Evaluation (Signed)
Physical Therapy Evaluation Patient Details Name: Laura Shah MRN: [id number] DOB: [birth year] Today's Date: 10/12/2018   History of Present Illness  Pt is a 38 y.o. F with significant PMH of prior discectomy (2019) and blood clots, recent anterior lumbar fusion with abdominal exposure 9/3 who presents with chief complaints of abdominal pain around postop site with puslike fluid. Possible post op cellulitis.  Clinical Impression  Pt admitted with above. Prior to admission, pt lives with her significant other and children, ambulatory with walker since back surgery 9/3. On PT evaluation, pt ambulating 100 feet with walker at a supervision level. Pt limited by new, sharp, throbbing pain in left calf with weightbearing; increased with dorsiflexion. No swelling noted, both legs felt slightly warm to touch. RN and MD notified. Will continue to progress as tolerated.     Follow Up Recommendations No PT follow up;Supervision - Intermittent    Equipment Recommendations  None recommended by PT    Recommendations for Other Services       Precautions / Restrictions Precautions Precautions: Fall;Back Precaution Booklet Issued: No Required Braces or Orthoses: Spinal Brace Spinal Brace: Lumbar corset;Applied in sitting position Restrictions Weight Bearing Restrictions: No      Mobility  Bed Mobility Overal bed mobility: Modified Independent                Transfers Overall transfer level: Modified independent Equipment used: Rolling walker (2 wheeled)                Ambulation/Gait Ambulation/Gait assistance: Supervision Gait Distance (Feet): 100 Feet Assistive device: Rolling walker (2 wheeled) Gait Pattern/deviations: Step-through pattern;Decreased stride length;Antalgic Gait velocity: decreased   General Gait Details: Decreased left anterior pelvic rotation during swing phase, antalgic gait pattern  Stairs            Wheelchair Mobility    Modified  Rankin (Stroke Patients Only)       Balance Overall balance assessment: Needs assistance   Sitting balance-Leahy Scale: Normal       Standing balance-[name]                               Pertinent Vitals/Pain Pain Assessment: Faces Faces Pain Scale: Hurts whole lot Pain Location: back, left calf with weightbearing Pain Descriptors / Indicators: Throbbing;Sharp Pain Intervention(s): Monitored during session;Limited activity within patient's tolerance;Other (comment)(RN/MD notified)    Home Living Family/patient expects to be discharged to:: Private residence Living Arrangements: Children;Spouse/significant other Available Help at Discharge: Other (Comment)(significant other) Type of Home: House Home Access: Stairs to enter Entrance Stairs-Rails: None Entrance Stairs-Number of Steps: 4 Home Layout: One level Home Equipment: Walker - 2 wheels;Crutches Additional Comments: Pt state boyfriend can assist her at discharge.  She has an 68 y.o autistic son, and an 26 y.o. 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     Hand Dominance   Dominant Hand: Right    Extremity/Trunk Assessment   Upper Extremity Assessment Upper Extremity Assessment: Overall WFL for tasks assessed    Lower Extremity Assessment Lower Extremity Assessment: RLE deficits/detail;LLE deficits/detail RLE Deficits / Details: Knee extension 3+/5, ankle dorsiflexion/plantaflexion 1/5 LLE Deficits / Details: Knee extension 3+/5, ankle dorsiflexion/plantaflexion 2/5    Cervical / Trunk Assessment Cervical / Trunk Assessment: Other exceptions Cervical / Trunk Exceptions: s/p lumbar fusion  Communication  Communication: No difficulties  Cognition Arousal/Alertness: Awake/alert Behavior During Therapy: WFL for tasks assessed/performed Overall Cognitive Status: Within Functional Limits for tasks assessed                                         General Comments      Exercises     Assessment/Plan    PT Assessment Patient needs continued PT services  PT Problem List Decreased strength;Decreased activity tolerance;Decreased balance;Decreased mobility;Pain;Decreased knowledge of precautions       PT Treatment Interventions DME instruction;Gait training;Stair training;Functional mobility training;Therapeutic activities;Balance training;Therapeutic exercise;Patient/family education    PT Goals (Current goals can be found in the Care Plan section)  Acute Rehab PT Goals Patient Stated Goal: go home in a few days PT Goal Formulation: With patient Time For Goal Achievement: 10/26/18 Potential to Achieve Goals: Good    Frequency Min 3X/week   Barriers to discharge        Co-evaluation               AM-PAC PT "6 Clicks" Mobility  Outcome Measure Help needed turning from your back to your side while in a flat bed without using bedrails?: None Help needed moving from lying on your back to sitting on the side of a flat bed without using bedrails?: None Help needed moving to and from a bed to a chair (including a wheelchair)?: None Help needed standing up from a chair using your arms (e.g., wheelchair or bedside chair)?: None Help needed to walk in hospital room?: None Help needed climbing 3-5 steps with a railing? : A Little 6 Click Score: 23    End of Session Equipment Utilized During Treatment: Back brace Activity Tolerance: Patient limited by pain Patient left: in bed;with call bell/phone within reach Nurse Communication: Other (comment)(calf pain) PT Visit Diagnosis: Pain;Difficulty in walking, not elsewhere classified (R26.2);Other abnormalities of gait and mobility (R26.89) Pain - part of body: (back)    Time: 1343-1401 PT Time Calculation (min) (ACUTE ONLY): 18 min   Charges:   PT Evaluation $PT Eval Moderate Complexity: 1 Mod          Laurina Bustlearoline  Winslow Ederer, [place]PT, DPT Acute Rehabilitation Services Pager [phone] Office [phone]   Vanetta MuldersCarloine H Zeki Bedrosian 10/12/2018, 2:14 PM

## 2018-10-12 NOTE — Progress Notes (Signed)
PROGRESS NOTE                                                                                                                                                                                                             Patient Demographics:    Laura Shah, is a 38 y.o. female, DOB - 1980-09-23, XKP:[id number]  Admit date - 10/10/2018   Admitting Physician Shela Leff, MD  Outpatient Primary MD for the patient is Kathyrn Lass, MD  LOS - 1  Chief Complaint  Patient presents with   Post-op Problem        Brief Narrative  Laura Shah is a 38 y.o. female with medical history significant of recent anterior lumbar fusion with abdominal exposure with Dr. Ellene Route and Dr. Donnetta Hutching 9/3, history of chronic right leg weakness and numbness, portal venous thrombosis, pancreatitis, sleeve gastrectomy, appendectomy, anemia, anxiety presented to ER on 10/10/2018 with chief complains of abdominal pain around the postop site with some puslike fluid oozing out from time to time, she states this started about 4 days after her surgery.  She contacted her surgeons and was prescribed Bactrim without much improvement.  Hence she came to the ER.  Her only subjective complaint is as above along with some subjective chills.  In the ER CT of chest abdomen pelvis with contrast was unremarkable.  She had no leukocytosis or fever but there was question of mild cellulitis around the postop site and she was admitted.   Subjective:   Patient in bed, appears comfortable, denies any headache, no fever, no chest pain or pressure, no shortness of breath , no abdominal pain. No focal weakness.    Assessment  & Plan :      1.  Postop left lower quadrant abdominal pain and discharge.  Possible mild postop site cellulitis, for now she is on broad-spectrum IV antibiotics, afebrile no leukocytosis.  So far blood cultures negative, site appears stable, could have contact allergy from Dermabond around the scar,  will place her on topical steroids, for now continue antibiotics.  Of note she has remained afebrile, no leukocytosis, stable CRP ESR, negative procalcitonin.  Case discussed with Dr. Ellene Route who would review her again Monday morning.  Continue pain control.  Overall she feels better with increase activity today and reevaluate tomorrow.        2.  GERD.  PPI.  3.  Lower lumbar disc disease with some bilateral leg pain and discomfort.  Recent surgery.  Supportive care.  PT eval from tomorrow.    Family Communication  :  None  Code Status :  Full  Disposition Plan  :  Home 2-3 days  Consults  :  N.Surg.  Procedures  :    CT chest, abdomen and pelvis. 1. No evidence of pulmonary embolism. No acute intrathoracic process. 2. Postsurgical changes of the left low anterior abdominal wall likely related to recent anterior lumbar spinal fusion and interbody spacer placement at L4-5. The presence of fluid and gas near the incision site is nonspecific given the time course since recent surgery. Small amount of overlying skin thickening is expected postoperatively though should correlate with clinical assessment to exclude a cellulitis. No evidence of abdominal wall dehiscence or organized abscess at this time. No intraperitoneal free fluid or gas. 3. Mild circumferential bladder wall thickening is nonspecific, and can be seen with cystitis. Correlate with urinalysis. 4. Postsurgical changes of prior gastric sleeve without evidence of complication. 5. Interval removal of the radiopaque IUD seen on comparison study. 6. Fibroid uterus.    DVT Prophylaxis  :  Lovenox    Lab Results  Component Value Date   PLT 321 10/12/2018    Diet :  Diet Order            Diet Heart Room service appropriate? Yes; Fluid consistency: Thin  Diet effective now               Inpatient Medications Scheduled Meds:  clobetasol cream   Topical BID   enoxaparin (LOVENOX) injection  40 mg Subcutaneous Q24H     pantoprazole  40 mg Oral QAC breakfast   Continuous Infusions:  ceFEPime (MAXIPIME) IV 2 g (10/12/18 0645)   vancomycin 750 mg (10/12/18 0011)   PRN Meds:.acetaminophen **OR** [DISCONTINUED] acetaminophen, alum & mag hydroxide-simeth **AND** lidocaine, diphenhydrAMINE, HYDROcodone-acetaminophen, ketorolac, methocarbamol  Antibiotics  :   Anti-infectives (From admission, onward)   Start     Dose/Rate Route Frequency Ordered Stop   10/11/18 1130  vancomycin (VANCOCIN) IVPB 750 mg/150 ml premix     750 mg 150 mL/hr over 60 Minutes Intravenous Every 12 hours 10/10/18 2237     10/11/18 0730  ceFEPIme (MAXIPIME) 2 g in sodium chloride 0.9 % 100 mL IVPB     2 g 200 mL/hr over 30 Minutes Intravenous Every 8 hours 10/10/18 2237     10/10/18 2245  vancomycin (VANCOCIN) IVPB 1000 mg/200 mL premix     1,000 mg 200 mL/hr over 60 Minutes Intravenous  Once 10/10/18 2232 10/11/18 0027   10/10/18 2245  ceFEPIme (MAXIPIME) 2 g in sodium chloride 0.9 % 100 mL IVPB     2 g 200 mL/hr over 30 Minutes Intravenous  Once 10/10/18 2232 10/10/18 2321          Objective:   Vitals:   10/11/18 1514 10/11/18 2010 10/12/18 0316 10/12/18 0759  BP: 100/68 127/79 117/72 122/75  Pulse: 80 84 72 87  Resp: 14   16  Temp: 98.4 F (36.9 C) 98.2 F (36.8 C) 98.4 F (36.9 C) 98 F (36.7 C)  TempSrc: Oral Oral Oral Oral  SpO2: 97% 100% 98% 100%    Wt Readings from Last 3 Encounters:  09/29/18 68.6 kg  08/25/18 70.4 kg  08/19/18 69.9 kg     Intake/Output Summary (Last 24 hours) at 10/12/2018 0942 Last data filed at 10/12/2018 0312 Gross per 24 hour  Intake 1080 ml  Output --  Net 1080 ml     Physical Exam  Awake Alert, Oriented X 3, No new F.N deficits, Normal affect [place].AT,PERRAL Supple Neck,No JVD, No cervical lymphadenopathy appriciated.  Symmetrical Chest wall movement, Good air movement bilaterally, CTAB RRR,No Gallops, Rubs or new Murmurs, No Parasternal Heave +ve B.Sounds, Abd Soft,  No tenderness, No organomegaly appriciated, No rebound - guarding or rigidity. No Cyanosis,   Left lower abdomen postop site scar stable, some surrounding redness/ rash, no pus.     Data Review:    CBC Recent Labs  Lab 10/10/18 1932 10/12/18 0410  WBC 5.9 5.0  HGB 11.7* 11.5*  HCT 38.0 36.6  PLT 296 321  MCV 85.0 84.3  MCH 26.2 26.5  MCHC 30.8 31.4  RDW 14.1 14.0  LYMPHSABS 1.7 1.4  MONOABS 0.7 0.6  EOSABS 0.8* 0.6*  BASOSABS 0.0 0.0    Chemistries  Recent Labs  Lab 10/10/18 1932 10/12/18 0410  NA 137 137  K 4.2 3.9  CL 104 105  CO2 22 22  GLUCOSE 94 78  BUN 11 9  CREATININE 0.84 0.63  CALCIUM 9.4 8.9  MG  --  2.0  AST 14* 13*  ALT 11 11  ALKPHOS 88 80  BILITOT 0.3 0.5   ------------------------------------------------------------------------------------------------------------------ No results for input(s): CHOL, HDL, LDLCALC, TRIG, CHOLHDL, LDLDIRECT in the last 72 hours.  No results found for: HGBA1C ------------------------------------------------------------------------------------------------------------------ No results for input(s): TSH, T4TOTAL, T3FREE, THYROIDAB in the last 72 hours.  Invalid input(s): FREET3 ------------------------------------------------------------------------------------------------------------------ No results for input(s): VITAMINB12, FOLATE, FERRITIN, TIBC, IRON, RETICCTPCT in the last 72 hours.  Coagulation profile No results for input(s): INR, PROTIME in the last 168 hours.  No results for input(s): DDIMER in the last 72 hours.  Cardiac Enzymes No results for input(s): CKMB, TROPONINI, MYOGLOBIN in the last 168 hours.  Invalid input(s): CK ------------------------------------------------------------------------------------------------------------------ No results found for: BNP  Micro Results Recent Results (from the past 240 hour(s))  Blood culture (routine x 2)     Status: None (Preliminary result)    Collection Time: 10/10/18  7:40 PM   Specimen: BLOOD  Result Value Ref Range Status   Specimen Description   Final    BLOOD LEFT ANTECUBITAL Performed at [facility], [address] [place], [place] [address]    Special Requests   Final    BOTTLES DRAWN AEROBIC ONLY Blood Culture results may not be optimal due to an inadequate volume of blood received in culture bottles Performed at [facility], [address] [place], [place] [address]    Culture   Final    NO GROWTH < 24 HOURS Performed at [facility], [place] [address] [place], [place] [address]    Report Status PENDING  Incomplete  SARS Coronavirus 2 [facility] order, Performed in [facility] hospital lab) Nasopharyngeal Nasopharyngeal Swab     Status: None   Collection Time: 10/10/18 10:32 PM   Specimen: Nasopharyngeal Swab  Result Value Ref Range Status   SARS Coronavirus 2 NEGATIVE NEGATIVE Final    Comment: (NOTE) If result is NEGATIVE SARS-CoV-2 target nucleic acids are NOT DETECTED. The SARS-CoV-2 RNA is generally detectable in upper and lower  respiratory specimens during the acute phase of infection. The lowest  concentration of SARS-CoV-2 viral copies this assay can detect is 250  copies / mL. A negative result does not preclude SARS-CoV-2 infection  and should not be used as the sole basis for treatment or other  patient management decisions.  A negative result may occur with  improper  specimen collection / handling, submission of specimen other  than nasopharyngeal swab, presence of viral mutation(s) within the  areas targeted by this assay, and inadequate number of viral copies  (<250 copies / mL). A negative result must be combined with clinical  observations, patient history, and epidemiological information. If result is POSITIVE SARS-CoV-2 target nucleic acids are DETECTED. The SARS-CoV-2 RNA is generally detectable in upper and lower  respiratory specimens dur ing the acute  phase of infection.  Positive  results are indicative of active infection with SARS-CoV-2.  Clinical  correlation with patient history and other diagnostic information is  necessary to determine patient infection status.  Positive results do  not rule out bacterial infection or co-infection with other viruses. If result is PRESUMPTIVE POSTIVE SARS-CoV-2 nucleic acids MAY BE PRESENT.   A presumptive positive result was obtained on the submitted specimen  and confirmed on repeat testing.  While 2019 novel coronavirus  (SARS-CoV-2) nucleic acids may be present in the submitted sample  additional confirmatory testing may be necessary for epidemiological  and / or clinical management purposes  to differentiate between  SARS-CoV-2 and other Sarbecovirus currently known to infect humans.  If clinically indicated additional testing with an alternate test  methodology [phone]) is advised. The SARS-CoV-2 RNA is generally  detectable in upper and lower respiratory sp ecimens during the acute  phase of infection. The expected result is Negative. Fact Sheet for Patients:  StrictlyIdeas.no Fact Sheet for Healthcare Providers: BankingDealers.co.za This test is not yet approved or cleared by the [place] FDA and has been authorized for detection and/or diagnosis of SARS-CoV-2 by FDA under an Emergency Use Authorization (EUA).  This EUA will remain in effect (meaning this test can be used) for the duration of the COVID-19 declaration under Section 564(b)(1) of the Act, 21 U.S.C. section 360bbb-3(b)(1), unless the authorization is terminated or revoked sooner. Performed at [facility], [address], [place], [place] [address]     Radiology Reports Dg Lumbar Spine 2-3 Views  Result Date: 10/02/2018 CLINICAL DATA:  Lumbar disc disease. EXAM: LUMBAR SPINE - 2-3 VIEW COMPARISON:  MRI dated 06/26/2018 FINDINGS: AP and lateral C-arm images  demonstrate the patient has undergone anterior lumbar fusion at L4-5. Hardware appears in good position. Alignment is normal. IMPRESSION: Anterior lumbar fusion performed at L4-5. Electronically Signed   By: Lorriane Shire M.D.   On: 10/02/2018 10:38   Ct Angio Chest Pe W And/or Wo Contrast  Addendum Date: 10/10/2018   ADDENDUM REPORT: 10/10/2018 21:43 ADDENDUM: Impression #6 should include: Small cystic foci at the uterine fundus, nonspecific but could reflect fluid within the endometrial canal or degenerating fibroid. Correlate with patient's menstrual status and consider pelvic ultrasound if felt to be clinically warranted. Electronically Signed   By: Lovena Le M.D.   On: 10/10/2018 21:43   Result Date: 10/10/2018 CLINICAL DATA:  Shortness of breath, abdominal pain, acute generalized, recent surgery and concern for abscess EXAM: CT ANGIOGRAPHY CHEST CT ABDOMEN AND PELVIS WITH CONTRAST TECHNIQUE: Multidetector CT imaging of the chest was performed using the standard protocol during bolus administration of intravenous contrast. Multiplanar CT image reconstructions and MIPs were obtained to evaluate the vascular anatomy. Multidetector CT imaging of the abdomen and pelvis was performed using the standard protocol during bolus administration of intravenous contrast. CONTRAST:  190m OMNIPAQUE IOHEXOL 350 MG/ML SOLN COMPARISON:  CT chest, abdomen and pelvis Jun 22, 2017 FINDINGS: CTA CHEST FINDINGS Cardiovascular: Satisfactory opacification of the pulmonary arteries.  No pulmonary arterial filling defects are identified. Pulmonary arteries are normal caliber. Normal heart size. No pericardial effusion. The aorta is normal caliber. Normal 3 vessel arch. Major venous structures are unremarkable. Mediastinum/Nodes: No enlarged mediastinal, hilar or axillary lymph nodes. Thyroid gland, trachea, and esophagus demonstrate no significant findings. Lungs/Pleura: No consolidation, features of edema, pneumothorax, or  effusion. No suspicious pulmonary nodules or masses. Musculoskeletal: No chest wall abnormality. No acute or significant osseous findings. Review of the MIP images confirms the above findings. CT ABDOMEN and PELVIS FINDINGS Hepatobiliary: No focal liver abnormality is seen. Patient is post cholecystectomy. Slight prominence of the biliary tree likely related to reservoir effect. No calcified intraductal gallstones. Pancreas: Unremarkable. No pancreatic ductal dilatation or surrounding inflammatory changes. Spleen: Normal in size without focal abnormality. Adrenals/Urinary Tract: Adrenal glands are unremarkable. Kidneys are normal, without renal calculi, focal lesion, or hydronephrosis. Mild circumferential bladder wall thickening. Stomach/Bowel: Postsurgical changes from prior gastric sleeve. Duodenal sweep is unremarkable. No small bowel dilatation or wall thickening. The appendix is surgically absent. No colonic dilatation or wall thickening. Vascular/Lymphatic: The aorta is normal caliber. No suspicious or enlarged lymph nodes in the included lymphatic chains. Reproductive: There is been interval removal of the radiopaque IUD seen on comparison study. The uterus heterogeneously enhances with several foci suggestive of uterine fibroids. Small cystic focus is seen within the posterior aspect of the uterine fundus. Normal follicles seen in both adnexa. Other: No intraperitoneal free fluid or gas. There are postsurgical changes of the lower anterior abdominal wall to the left of midline with a small lentiform fluid collection measuring 10 mm in maximal thickness along the anterior abdominal wall and few adjacent foci of subcutaneous gas. No abnormal dehiscence of the anterior abdominal wall. No rim enhancing collection is identified. Musculoskeletal: Interval L4-5 anterior spinal fusion with placement of an interbody spacer cage. No other acute osseous abnormality or suspicious osseous lesions. Review of the MIP  images confirms the above findings. IMPRESSION: 1. No evidence of pulmonary embolism. No acute intrathoracic process. 2. Postsurgical changes of the left low anterior abdominal wall likely related to recent anterior lumbar spinal fusion and interbody spacer placement at L4-5. The presence of fluid and gas near the incision site is nonspecific given the time course since recent surgery. Small amount of overlying skin thickening is expected postoperatively though should correlate with clinical assessment to exclude a cellulitis. No evidence of abdominal wall dehiscence or organized abscess at this time. No intraperitoneal free fluid or gas. 3. Mild circumferential bladder wall thickening is nonspecific, and can be seen with cystitis. Correlate with urinalysis. 4. Postsurgical changes of prior gastric sleeve without evidence of complication. 5. Interval removal of the radiopaque IUD seen on comparison study. 6. Fibroid uterus. Electronically Signed: By: Lovena Le M.D. On: 10/10/2018 21:39   Ct Abdomen Pelvis W Contrast  Addendum Date: 10/10/2018   ADDENDUM REPORT: 10/10/2018 21:43 ADDENDUM: Impression #6 should include: Small cystic foci at the uterine fundus, nonspecific but could reflect fluid within the endometrial canal or degenerating fibroid. Correlate with patient's menstrual status and consider pelvic ultrasound if felt to be clinically warranted. Electronically Signed   By: Lovena Le M.D.   On: 10/10/2018 21:43   Result Date: 10/10/2018 CLINICAL DATA:  Shortness of breath, abdominal pain, acute generalized, recent surgery and concern for abscess EXAM: CT ANGIOGRAPHY CHEST CT ABDOMEN AND PELVIS WITH CONTRAST TECHNIQUE: Multidetector CT imaging of the chest was performed using the standard protocol during bolus administration of intravenous contrast.  Multiplanar CT image reconstructions and MIPs were obtained to evaluate the vascular anatomy. Multidetector CT imaging of the abdomen and pelvis was  performed using the standard protocol during bolus administration of intravenous contrast. CONTRAST:  137m OMNIPAQUE IOHEXOL 350 MG/ML SOLN COMPARISON:  CT chest, abdomen and pelvis Jun 22, 2017 FINDINGS: CTA CHEST FINDINGS Cardiovascular: Satisfactory opacification of the pulmonary arteries. No pulmonary arterial filling defects are identified. Pulmonary arteries are normal caliber. Normal heart size. No pericardial effusion. The aorta is normal caliber. Normal 3 vessel arch. Major venous structures are unremarkable. Mediastinum/Nodes: No enlarged mediastinal, hilar or axillary lymph nodes. Thyroid gland, trachea, and esophagus demonstrate no significant findings. Lungs/Pleura: No consolidation, features of edema, pneumothorax, or effusion. No suspicious pulmonary nodules or masses. Musculoskeletal: No chest wall abnormality. No acute or significant osseous findings. Review of the MIP images confirms the above findings. CT ABDOMEN and PELVIS FINDINGS Hepatobiliary: No focal liver abnormality is seen. Patient is post cholecystectomy. Slight prominence of the biliary tree likely related to reservoir effect. No calcified intraductal gallstones. Pancreas: Unremarkable. No pancreatic ductal dilatation or surrounding inflammatory changes. Spleen: Normal in size without focal abnormality. Adrenals/Urinary Tract: Adrenal glands are unremarkable. Kidneys are normal, without renal calculi, focal lesion, or hydronephrosis. Mild circumferential bladder wall thickening. Stomach/Bowel: Postsurgical changes from prior gastric sleeve. Duodenal sweep is unremarkable. No small bowel dilatation or wall thickening. The appendix is surgically absent. No colonic dilatation or wall thickening. Vascular/Lymphatic: The aorta is normal caliber. No suspicious or enlarged lymph nodes in the included lymphatic chains. Reproductive: There is been interval removal of the radiopaque IUD seen on comparison study. The uterus heterogeneously  enhances with several foci suggestive of uterine fibroids. Small cystic focus is seen within the posterior aspect of the uterine fundus. Normal follicles seen in both adnexa. Other: No intraperitoneal free fluid or gas. There are postsurgical changes of the lower anterior abdominal wall to the left of midline with a small lentiform fluid collection measuring 10 mm in maximal thickness along the anterior abdominal wall and few adjacent foci of subcutaneous gas. No abnormal dehiscence of the anterior abdominal wall. No rim enhancing collection is identified. Musculoskeletal: Interval L4-5 anterior spinal fusion with placement of an interbody spacer cage. No other acute osseous abnormality or suspicious osseous lesions. Review of the MIP images confirms the above findings. IMPRESSION: 1. No evidence of pulmonary embolism. No acute intrathoracic process. 2. Postsurgical changes of the left low anterior abdominal wall likely related to recent anterior lumbar spinal fusion and interbody spacer placement at L4-5. The presence of fluid and gas near the incision site is nonspecific given the time course since recent surgery. Small amount of overlying skin thickening is expected postoperatively though should correlate with clinical assessment to exclude a cellulitis. No evidence of abdominal wall dehiscence or organized abscess at this time. No intraperitoneal free fluid or gas. 3. Mild circumferential bladder wall thickening is nonspecific, and can be seen with cystitis. Correlate with urinalysis. 4. Postsurgical changes of prior gastric sleeve without evidence of complication. 5. Interval removal of the radiopaque IUD seen on comparison study. 6. Fibroid uterus. Electronically Signed: By: PLovena LeM.D. On: 10/10/2018 21:39   Dg C-arm 1-60 Min  Result Date: 10/02/2018 CLINICAL DATA:  Lumbar disc disease. EXAM: DG C-ARM 1-60 MIN FLUOROSCOPY TIME:  Fluoroscopy Time:  49 seconds Electronically Signed   By: JLorriane Shire M.D.   On: 10/02/2018 10:37   Dg Or Local Abdomen  Result Date: 10/02/2018 CLINICAL DATA:  Sponge  count. EXAM: OR LOCAL ABDOMEN COMPARISON:  CT abdomen pelvis dated March 16, 2015. FINDINGS: No radiopaque foreign body. Interval L4-L5 ALIF. The bowel gas pattern is normal. No radio-opaque calculi or other significant radiographic abnormality are seen. IMPRESSION: 1.  No radiopaque foreign body identified. These results were called by telephone at the time of interpretation on 10/02/2018 at 10:23 am to [facility], who verbally acknowledged these results. Electronically Signed   By: Titus Dubin M.D.   On: 10/02/2018 10:25    Time Spent in minutes  30   Lala Lund M.D on 10/12/2018 at 9:42 AM  To page go to [email or web address] - password [email or web address]

## 2018-10-12 NOTE — Progress Notes (Signed)
RN called to room by PT, Pt complained of left leg calf pain which worsens with dorsiflexion, no swelling or redness noted, both legs warm to touch. MD notified. SCDs applied, educated Pt on importance of mobility. Rechecked at 16:00, Pt complains of cramping on L leg, PRN med given, encouraged to ambulate, will continue to monitor.

## 2018-10-12 NOTE — Progress Notes (Signed)
Ambulated Pt in the hallway, continued to complain of L calf pain = 9/10, no swelling, redness, tenderness noted. Notified MD. Received verbal order for bilateral lower extremities venous ultrasound. Will continue to monitor.

## 2018-10-12 NOTE — Plan of Care (Signed)

## 2018-10-12 NOTE — Plan of Care (Signed)

## 2018-10-13 ENCOUNTER — Encounter (HOSPITAL_COMMUNITY): Payer: Self-pay | Admitting: General Practice

## 2018-10-13 ENCOUNTER — Inpatient Hospital Stay (HOSPITAL_COMMUNITY): Payer: BC Managed Care – PPO

## 2018-10-13 DIAGNOSIS — M79662 Pain in left lower leg: Secondary | ICD-10-CM

## 2018-10-13 LAB — COMPREHENSIVE METABOLIC PANEL
ALT: 8 U/L (ref 0–44)
AST: 12 U/L — ABNORMAL LOW (ref 15–41)
Albumin: 3.1 g/dL — ABNORMAL LOW (ref 3.5–5.0)
Alkaline Phosphatase: 72 U/L (ref 38–126)
Anion gap: 8 (ref 5–15)
BUN: 11 mg/dL (ref 6–20)
CO2: 23 mmol/L (ref 22–32)
Calcium: 8.7 mg/dL — ABNORMAL LOW (ref 8.9–10.3)
Chloride: 106 mmol/L (ref 98–111)
Creatinine, Ser: 0.6 mg/dL (ref 0.44–1.00)
GFR calc Af Amer: >60 mL/min (ref >60)
GFR calc non Af Amer: >60 mL/min (ref >60)
Glucose, Bld: 87 mg/dL (ref 70–99)
Potassium: 3.9 mmol/L (ref 3.5–5.1)
Sodium: 137 mmol/L (ref 135–145)
Total Bilirubin: 0.3 mg/dL (ref 0.3–1.2)
Total Protein: 6.3 g/dL — ABNORMAL LOW (ref 6.5–8.1)

## 2018-10-13 LAB — CBC WITH DIFFERENTIAL/PLATELET
Abs Immature Granulocytes: 0.02 10*3/uL (ref 0.00–0.07)
Basophils Absolute: 0 10*3/uL (ref 0.0–0.1)
Basophils Relative: 1 %
Eosinophils Absolute: 0.4 10*3/uL (ref 0.0–0.5)
Eosinophils Relative: 10 %
HCT: 35.7 % — ABNORMAL LOW (ref 36.0–46.0)
Hemoglobin: 11.1 g/dL — ABNORMAL LOW (ref 12.0–15.0)
Immature Granulocytes: 1 %
Lymphocytes Relative: 30 %
Lymphs Abs: 1.2 10*3/uL (ref 0.7–4.0)
MCH: 26.4 pg (ref 26.0–34.0)
MCHC: 31.1 g/dL (ref 30.0–36.0)
MCV: 85 fL (ref 80.0–100.0)
Monocytes Absolute: 0.5 10*3/uL (ref 0.1–1.0)
Monocytes Relative: 11 %
Neutro Abs: 2 10*3/uL (ref 1.7–7.7)
Neutrophils Relative %: 47 %
Platelets: 310 10*3/uL (ref 150–400)
RBC: 4.2 MIL/uL (ref 3.87–5.11)
RDW: 14.1 % (ref 11.5–15.5)
WBC: 4.1 10*3/uL (ref 4.0–10.5)
nRBC: 0 % (ref 0.0–0.2)

## 2018-10-13 LAB — MAGNESIUM: Magnesium: 2 mg/dL (ref 1.7–2.4)

## 2018-10-13 LAB — PROCALCITONIN: Procalcitonin: <0.1 ng/mL

## 2018-10-13 LAB — C-REACTIVE PROTEIN: CRP: <0.8 mg/dL (ref <1.0)

## 2018-10-13 MED ORDER — OXYCODONE HCL 5 MG PO TABS
5.0000 mg | ORAL_TABLET | Freq: Once | ORAL | Status: AC
  Administered 2018-10-13: 5 mg via ORAL
  Filled 2018-10-13: qty 1

## 2018-10-13 MED ORDER — WHITE PETROLATUM EX OINT
TOPICAL_OINTMENT | CUTANEOUS | Status: AC
  Administered 2018-10-13: 05:00:00
  Filled 2018-10-13: qty 28.35

## 2018-10-13 MED ORDER — METHOCARBAMOL 500 MG PO TABS: 500.0000 mg | ORAL_TABLET | Freq: Four times a day (QID) | ORAL | 0 refills | Status: DC | PRN

## 2018-10-13 MED ORDER — CLOBETASOL PROPIONATE 0.05 % EX CREA: TOPICAL_CREAM | Freq: Three times a day (TID) | CUTANEOUS | 0 refills | Status: DC

## 2018-10-13 MED ORDER — HYDROCODONE-ACETAMINOPHEN 5-325 MG PO TABS: 1.0000 | ORAL_TABLET | Freq: Four times a day (QID) | ORAL | 0 refills | Status: DC | PRN

## 2018-10-13 MED ORDER — ACETAMINOPHEN 325 MG PO TABS: 650.0000 mg | ORAL_TABLET | Freq: Four times a day (QID) | ORAL | 0 refills | Status: DC | PRN

## 2018-10-13 MED ORDER — PREDNISONE 20 MG PO TABS
30.0000 mg | ORAL_TABLET | Freq: Once | ORAL | Status: AC
  Administered 2018-10-13: 30 mg via ORAL
  Filled 2018-10-13: qty 1

## 2018-10-13 MED ORDER — DIPHENHYDRAMINE HCL 50 MG PO TABS: 25.0000 mg | ORAL_TABLET | Freq: Three times a day (TID) | ORAL | 0 refills | Status: DC | PRN

## 2018-10-13 NOTE — Progress Notes (Signed)
Pt has been itching since the infusion of IV antibiotics last night, and also complaining of severe pain in the left leg benadryl give twice this shift, on call doctor notified,nurse got an order of  one time dose of oxy IR, will give and continue to monitor patient, thanks!

## 2018-10-13 NOTE — Discharge Summary (Signed)
Laura Shah LPF:[id number] DOB: 28-Jan-1981 DOA: 10/10/2018  PCP: Kathyrn Lass, MD  Admit date: 10/10/2018  Discharge date: 10/13/2018  Admitted From: Home   Disposition:  Home   Recommendations for Outpatient Follow-up:   Follow up with PCP in 1-2 weeks  PCP Please obtain BMP/CBC, 2 view CXR in 1week,  (see Discharge instructions)   PCP Please follow up on the following pending results: none   Home Health: None   Equipment/Devices: None Consultations: None  Discharge Condition: Stable    CODE STATUS: Full    Diet Recommendation: Heart Healthy     Chief Complaint  Patient presents with   Post-op Problem     Brief history of present illness from the day of admission and additional interim summary    Laura Shah a 38 y.o.femalewith medical history significant ofrecent anterior lumbar fusion with abdominal exposure with Dr. Ellene Route and Dr. Donnetta Hutching 9/3, history of chronic right leg weakness and numbness, portal venous thrombosis, pancreatitis, sleeve gastrectomy, appendectomy, anemia, anxiety presented to ER on 10/10/2018 with chief complains of abdominal pain around the postop site with some puslike fluid oozing out from time to time, she states this started about 4 days after her surgery.  She contacted her surgeons and was prescribed Bactrim without much improvement.  Hence she came to the ER.  Her only subjective complaint is as above along with some subjective chills.  In the ER CT of chest abdomen pelvis with contrast was unremarkable.  She had no leukocytosis or fever but there was question of mild cellulitis around the postop site and she was admitted.                                                                 Hospital Course    1.  Postop left lower quadrant abdominal pain and discharge.   Possible mild postop site cellulitis, for now she is on broad-spectrum IV antibiotics, afebrile no leukocytosis.  So far blood cultures negative, site appears stable, could have contact allergy from Dermabond around the scar, antibiotics have been stopped and she has been given topical steroid cream with improvement in her rash.  Of note she has remained afebrile throughout her hospital stay, no leukocytosis, stable CRP ESR, negative procalcitonin.  Case discussed with Dr. Ellene Route who agrees that her presentation was more consistent with local allergy to Dermabond than a true infection.  Stop all antibiotics, given some Benadryl and topical steroid cream, will be discharged and will follow up with Dr. Ellene Route in a week along with PCP..   2.  GERD.  PPI.  3.  Lower lumbar disc disease with some bilateral leg pain and discomfort.  Recent surgery.  Supportive care.  PT eval from tomorrow.  4.  Left calf pain.  Appears musculoskeletal.  Exam  is completely unremarkable, no swelling or warmth in the left thigh or calf area, venous duplex unremarkable.  Supportive care and discharge.   Discharge diagnosis     Principal Problem:   Cellulitis Active Problems:   GERD (gastroesophageal reflux disease)    Discharge instructions    Discharge Instructions    Diet - low sodium heart healthy   Complete by: As directed    Discharge instructions   Complete by: As directed    Follow with Primary MD Kathyrn Lass, MD in 7 days   Get CBC, CMP   checked next visit within 1 week by Primary MD    Activity: As tolerated with Full fall precautions use walker/cane & assistance as needed  Disposition Home    Diet: Heart Healthy    Special Instructions: If you have smoked or chewed Tobacco  in the last 2 yrs please stop smoking, stop any regular Alcohol  and or any Recreational drug use.  On your next visit with your primary care physician please Get Medicines reviewed and adjusted.  Please request your  Prim.MD to go over all Hospital Tests and Procedure/Radiological results at the follow up, please get all Hospital records sent to your Prim MD by signing hospital release before you go home.  If you experience worsening of your admission symptoms, develop shortness of breath, life threatening emergency, suicidal or homicidal thoughts you must seek medical attention immediately by calling 911 or calling your MD immediately  if symptoms less severe.  You Must read complete instructions/literature along with all the possible adverse reactions/side effects for all the Medicines you take and that have been prescribed to you. Take any new Medicines after you have completely understood and accpet all the possible adverse reactions/side effects.   Increase activity slowly   Complete by: As directed       Discharge Medications   Allergies as of 10/13/2018      Reactions   Reglan [metoclopramide] Anxiety   Morphine And Related Itching      Medication List    STOP taking these medications   diphenhydramine-acetaminophen 25-500 MG Tabs tablet Commonly known as: TYLENOL PM   sulfamethoxazole-trimethoprim 800-160 MG tablet Commonly known as: BACTRIM DS     TAKE these medications   acetaminophen 325 MG tablet Commonly known as: TYLENOL Take 2 tablets (650 mg total) by mouth every 6 (six) hours as needed for mild pain or moderate pain (or Fever >/= 101).   clobetasol cream 0.05 % Commonly known as: TEMOVATE Apply topically 3 (three) times daily. To affected skin in the left groin and thigh area.   diphenhydrAMINE 50 MG tablet Commonly known as: BENADRYL Take 0.5 tablets (25 mg total) by mouth every 8 (eight) hours as needed for itching.   HYDROcodone-acetaminophen 5-325 MG tablet Commonly known as: NORCO/VICODIN Take 1 tablet by mouth every 6 (six) hours as needed for severe pain. What changed:   how much to take  when to take this  reasons to take this  Another medication with  the same name was removed. Continue taking this medication, and follow the directions you see here.   methocarbamol 500 MG tablet Commonly known as: ROBAXIN Take 1 tablet (500 mg total) by mouth every 6 (six) hours as needed for muscle spasms.   pantoprazole 40 MG tablet Commonly known as: PROTONIX Take 1 tablet (40 mg total) by mouth daily. What changed: when to take this       Follow-up Information    Sabra Heck,  Lattie Haw, MD. Schedule an appointment as soon as possible for a visit in 1 week(s).   Specialty: Family Medicine Contact information: [place] [address] [phone]        Kristeen Miss, MD. Schedule an appointment as soon as possible for a visit in 1 week(s).   Specialty: Neurosurgery Contact information: [address] [place] [address] [phone]           Major procedures and Radiology Reports - PLEASE review detailed and final reports thoroughly  -       Dg Lumbar Spine 2-3 Views  Result Date: 10/02/2018 CLINICAL DATA:  Lumbar disc disease. EXAM: LUMBAR SPINE - 2-3 VIEW COMPARISON:  MRI dated 06/26/2018 FINDINGS: AP and lateral C-arm images demonstrate the patient has undergone anterior lumbar fusion at L4-5. Hardware appears in good position. Alignment is normal. IMPRESSION: Anterior lumbar fusion performed at L4-5. Electronically Signed   By: Lorriane Shire M.D.   On: 10/02/2018 10:38   Ct Angio Chest Pe W And/or Wo Contrast  Addendum Date: 10/10/2018   ADDENDUM REPORT: 10/10/2018 21:43 ADDENDUM: Impression #6 should include: Small cystic foci at the uterine fundus, nonspecific but could reflect fluid within the endometrial canal or degenerating fibroid. Correlate with patient's menstrual status and consider pelvic ultrasound if felt to be clinically warranted. Electronically Signed   By: Lovena Le M.D.   On: 10/10/2018 21:43   Result Date: 10/10/2018 CLINICAL DATA:  Shortness of breath, abdominal pain, acute  generalized, recent surgery and concern for abscess EXAM: CT ANGIOGRAPHY CHEST CT ABDOMEN AND PELVIS WITH CONTRAST TECHNIQUE: Multidetector CT imaging of the chest was performed using the standard protocol during bolus administration of intravenous contrast. Multiplanar CT image reconstructions and MIPs were obtained to evaluate the vascular anatomy. Multidetector CT imaging of the abdomen and pelvis was performed using the standard protocol during bolus administration of intravenous contrast. CONTRAST:  128m OMNIPAQUE IOHEXOL 350 MG/ML SOLN COMPARISON:  CT chest, abdomen and pelvis Jun 22, 2017 FINDINGS: CTA CHEST FINDINGS Cardiovascular: Satisfactory opacification of the pulmonary arteries. No pulmonary arterial filling defects are identified. Pulmonary arteries are normal caliber. Normal heart size. No pericardial effusion. The aorta is normal caliber. Normal 3 vessel arch. Major venous structures are unremarkable. Mediastinum/Nodes: No enlarged mediastinal, hilar or axillary lymph nodes. Thyroid gland, trachea, and esophagus demonstrate no significant findings. Lungs/Pleura: No consolidation, features of edema, pneumothorax, or effusion. No suspicious pulmonary nodules or masses. Musculoskeletal: No chest wall abnormality. No acute or significant osseous findings. Review of the MIP images confirms the above findings. CT ABDOMEN and PELVIS FINDINGS Hepatobiliary: No focal liver abnormality is seen. Patient is post cholecystectomy. Slight prominence of the biliary tree likely related to reservoir effect. No calcified intraductal gallstones. Pancreas: Unremarkable. No pancreatic ductal dilatation or surrounding inflammatory changes. Spleen: Normal in size without focal abnormality. Adrenals/Urinary Tract: Adrenal glands are unremarkable. Kidneys are normal, without renal calculi, focal lesion, or hydronephrosis. Mild circumferential bladder wall thickening. Stomach/Bowel: Postsurgical changes from prior gastric  sleeve. Duodenal sweep is unremarkable. No small bowel dilatation or wall thickening. The appendix is surgically absent. No colonic dilatation or wall thickening. Vascular/Lymphatic: The aorta is normal caliber. No suspicious or enlarged lymph nodes in the included lymphatic chains. Reproductive: There is been interval removal of the radiopaque IUD seen on comparison study. The uterus heterogeneously enhances with several foci suggestive of uterine fibroids. Small cystic focus is seen within the posterior aspect of the uterine fundus. Normal follicles seen in  both adnexa. Other: No intraperitoneal free fluid or gas. There are postsurgical changes of the lower anterior abdominal wall to the left of midline with a small lentiform fluid collection measuring 10 mm in maximal thickness along the anterior abdominal wall and few adjacent foci of subcutaneous gas. No abnormal dehiscence of the anterior abdominal wall. No rim enhancing collection is identified. Musculoskeletal: Interval L4-5 anterior spinal fusion with placement of an interbody spacer cage. No other acute osseous abnormality or suspicious osseous lesions. Review of the MIP images confirms the above findings. IMPRESSION: 1. No evidence of pulmonary embolism. No acute intrathoracic process. 2. Postsurgical changes of the left low anterior abdominal wall likely related to recent anterior lumbar spinal fusion and interbody spacer placement at L4-5. The presence of fluid and gas near the incision site is nonspecific given the time course since recent surgery. Small amount of overlying skin thickening is expected postoperatively though should correlate with clinical assessment to exclude a cellulitis. No evidence of abdominal wall dehiscence or organized abscess at this time. No intraperitoneal free fluid or gas. 3. Mild circumferential bladder wall thickening is nonspecific, and can be seen with cystitis. Correlate with urinalysis. 4. Postsurgical changes of  prior gastric sleeve without evidence of complication. 5. Interval removal of the radiopaque IUD seen on comparison study. 6. Fibroid uterus. Electronically Signed: By: Lovena Le M.D. On: 10/10/2018 21:39   Ct Abdomen Pelvis W Contrast  Addendum Date: 10/10/2018   ADDENDUM REPORT: 10/10/2018 21:43 ADDENDUM: Impression #6 should include: Small cystic foci at the uterine fundus, nonspecific but could reflect fluid within the endometrial canal or degenerating fibroid. Correlate with patient's menstrual status and consider pelvic ultrasound if felt to be clinically warranted. Electronically Signed   By: Lovena Le M.D.   On: 10/10/2018 21:43   Result Date: 10/10/2018 CLINICAL DATA:  Shortness of breath, abdominal pain, acute generalized, recent surgery and concern for abscess EXAM: CT ANGIOGRAPHY CHEST CT ABDOMEN AND PELVIS WITH CONTRAST TECHNIQUE: Multidetector CT imaging of the chest was performed using the standard protocol during bolus administration of intravenous contrast. Multiplanar CT image reconstructions and MIPs were obtained to evaluate the vascular anatomy. Multidetector CT imaging of the abdomen and pelvis was performed using the standard protocol during bolus administration of intravenous contrast. CONTRAST:  192m OMNIPAQUE IOHEXOL 350 MG/ML SOLN COMPARISON:  CT chest, abdomen and pelvis Jun 22, 2017 FINDINGS: CTA CHEST FINDINGS Cardiovascular: Satisfactory opacification of the pulmonary arteries. No pulmonary arterial filling defects are identified. Pulmonary arteries are normal caliber. Normal heart size. No pericardial effusion. The aorta is normal caliber. Normal 3 vessel arch. Major venous structures are unremarkable. Mediastinum/Nodes: No enlarged mediastinal, hilar or axillary lymph nodes. Thyroid gland, trachea, and esophagus demonstrate no significant findings. Lungs/Pleura: No consolidation, features of edema, pneumothorax, or effusion. No suspicious pulmonary nodules or masses.  Musculoskeletal: No chest wall abnormality. No acute or significant osseous findings. Review of the MIP images confirms the above findings. CT ABDOMEN and PELVIS FINDINGS Hepatobiliary: No focal liver abnormality is seen. Patient is post cholecystectomy. Slight prominence of the biliary tree likely related to reservoir effect. No calcified intraductal gallstones. Pancreas: Unremarkable. No pancreatic ductal dilatation or surrounding inflammatory changes. Spleen: Normal in size without focal abnormality. Adrenals/Urinary Tract: Adrenal glands are unremarkable. Kidneys are normal, without renal calculi, focal lesion, or hydronephrosis. Mild circumferential bladder wall thickening. Stomach/Bowel: Postsurgical changes from prior gastric sleeve. Duodenal sweep is unremarkable. No small bowel dilatation or wall thickening. The appendix is surgically absent. No colonic dilatation  or wall thickening. Vascular/Lymphatic: The aorta is normal caliber. No suspicious or enlarged lymph nodes in the included lymphatic chains. Reproductive: There is been interval removal of the radiopaque IUD seen on comparison study. The uterus heterogeneously enhances with several foci suggestive of uterine fibroids. Small cystic focus is seen within the posterior aspect of the uterine fundus. Normal follicles seen in both adnexa. Other: No intraperitoneal free fluid or gas. There are postsurgical changes of the lower anterior abdominal wall to the left of midline with a small lentiform fluid collection measuring 10 mm in maximal thickness along the anterior abdominal wall and few adjacent foci of subcutaneous gas. No abnormal dehiscence of the anterior abdominal wall. No rim enhancing collection is identified. Musculoskeletal: Interval L4-5 anterior spinal fusion with placement of an interbody spacer cage. No other acute osseous abnormality or suspicious osseous lesions. Review of the MIP images confirms the above findings. IMPRESSION: 1. No  evidence of pulmonary embolism. No acute intrathoracic process. 2. Postsurgical changes of the left low anterior abdominal wall likely related to recent anterior lumbar spinal fusion and interbody spacer placement at L4-5. The presence of fluid and gas near the incision site is nonspecific given the time course since recent surgery. Small amount of overlying skin thickening is expected postoperatively though should correlate with clinical assessment to exclude a cellulitis. No evidence of abdominal wall dehiscence or organized abscess at this time. No intraperitoneal free fluid or gas. 3. Mild circumferential bladder wall thickening is nonspecific, and can be seen with cystitis. Correlate with urinalysis. 4. Postsurgical changes of prior gastric sleeve without evidence of complication. 5. Interval removal of the radiopaque IUD seen on comparison study. 6. Fibroid uterus. Electronically Signed: By: Lovena Le M.D. On: 10/10/2018 21:39   Dg C-arm 1-60 Min  Result Date: 10/02/2018 CLINICAL DATA:  Lumbar disc disease. EXAM: DG C-ARM 1-60 MIN FLUOROSCOPY TIME:  Fluoroscopy Time:  49 seconds Electronically Signed   By: Lorriane Shire M.D.   On: 10/02/2018 10:37   Vas [place] Lower Extremity Venous (dvt)  Result Date: 10/13/2018  Lower Venous Study Indications: Pain. left calf  Risk Factors: DVT remote history of DVT. Surgery 10/02/2018 Back. Comparison Study: Previous exam several years ago negative bilaterally Performing Technologist: [employer] [facility]  Examination Guidelines: A complete evaluation includes B-mode imaging, spectral Doppler, color Doppler, and power Doppler as needed of all accessible portions of each vessel. Bilateral testing is considered an integral part of a complete examination. Limited examinations for reoccurring indications may be performed as noted.  +---------+---------------+---------+-----------+----------+--------------+  RIGHT      Compressibility Phasicity Spontaneity Properties Thrombus Aging  +---------+---------------+---------+-----------+----------+--------------+  CFV       Full            Yes       Yes                                    +---------+---------------+---------+-----------+----------+--------------+  SFJ       Full                                                             +---------+---------------+---------+-----------+----------+--------------+  FV Prox   Full  Yes       Yes                                    +---------+---------------+---------+-----------+----------+--------------+  FV Mid    Full                                                             +---------+---------------+---------+-----------+----------+--------------+  FV Distal Full            Yes       Yes                                    +---------+---------------+---------+-----------+----------+--------------+  PFV       Full            Yes       Yes                                    +---------+---------------+---------+-----------+----------+--------------+  POP       Full            Yes       Yes                                    +---------+---------------+---------+-----------+----------+--------------+  PTV       Full                                                             +---------+---------------+---------+-----------+----------+--------------+  PERO      Full                                                             +---------+---------------+---------+-----------+----------+--------------+   +---------+---------------+---------+-----------+----------+--------------+  LEFT      Compressibility Phasicity Spontaneity Properties Thrombus Aging  +---------+---------------+---------+-----------+----------+--------------+  CFV       Full            Yes       Yes                                    +---------+---------------+---------+-----------+----------+--------------+  SFJ       Full                                                              +---------+---------------+---------+-----------+----------+--------------+  FV Prox   Full  Yes       Yes                                    +---------+---------------+---------+-----------+----------+--------------+  FV Mid    Full                                                             +---------+---------------+---------+-----------+----------+--------------+  FV Distal Full            Yes       Yes                                    +---------+---------------+---------+-----------+----------+--------------+  PFV       Full            Yes       Yes                                    +---------+---------------+---------+-----------+----------+--------------+  POP       Full            Yes       Yes                                    +---------+---------------+---------+-----------+----------+--------------+  PTV       Full                                                             +---------+---------------+---------+-----------+----------+--------------+  PERO      Full                                                             +---------+---------------+---------+-----------+----------+--------------+     Summary: Right: There is no evidence of deep vein thrombosis in the lower extremity. No cystic structure found in the popliteal fossa. Left: There is no evidence of deep vein thrombosis in the lower extremity. No cystic structure found in the popliteal fossa.  *See table(s) above for measurements and observations.    Preliminary    Dg Or Local Abdomen  Result Date: 10/02/2018 CLINICAL DATA:  Sponge count. EXAM: OR LOCAL ABDOMEN COMPARISON:  CT abdomen pelvis dated March 16, 2015. FINDINGS: No radiopaque foreign body. Interval L4-L5 ALIF. The bowel gas pattern is normal. No radio-opaque calculi or other significant radiographic abnormality are seen. IMPRESSION: 1.  No radiopaque foreign body identified. These results were called by telephone at the time of interpretation  on 10/02/2018 at 10:23 am to [facility], who verbally acknowledged these results. Electronically Signed   By: Titus Dubin M.D.   On: 10/02/2018 10:25  Micro Results     Recent Results (from the past 240 hour(s))  Blood culture (routine x 2)     Status: None (Preliminary result)   Collection Time: 10/10/18  7:40 PM   Specimen: BLOOD  Result Value Ref Range Status   Specimen Description   Final    BLOOD LEFT ANTECUBITAL Performed at [facility], [address] [place], [place] [address]    Special Requests   Final    BOTTLES DRAWN AEROBIC ONLY Blood Culture results may not be optimal due to an inadequate volume of blood received in culture bottles Performed at [facility], [address] [place], [place] [address]    Culture   Final    NO GROWTH 1 DAY Performed at [facility], [address], [place], [place] [address]    Report Status PENDING  Incomplete  SARS Coronavirus 2 [facility] order, Performed in [facility] hospital lab) Nasopharyngeal Nasopharyngeal Swab     Status: None   Collection Time: 10/10/18 10:32 PM   Specimen: Nasopharyngeal Swab  Result Value Ref Range Status   SARS Coronavirus 2 NEGATIVE NEGATIVE Final    Comment: (NOTE) If result is NEGATIVE SARS-CoV-2 target nucleic acids are NOT DETECTED. The SARS-CoV-2 RNA is generally detectable in upper and lower  respiratory specimens during the acute phase of infection. The lowest  concentration of SARS-CoV-2 viral copies this assay can detect is 250  copies / mL. A negative result does not preclude SARS-CoV-2 infection  and should not be used as the sole basis for treatment or other  patient management decisions.  A negative result may occur with  improper specimen collection / handling, submission of specimen other  than nasopharyngeal swab, presence of viral mutation(s) within the  areas targeted by this assay, and inadequate number of viral copies  (<250 copies / mL). A  negative result must be combined with clinical  observations, patient history, and epidemiological information. If result is POSITIVE SARS-CoV-2 target nucleic acids are DETECTED. The SARS-CoV-2 RNA is generally detectable in upper and lower  respiratory specimens dur ing the acute phase of infection.  Positive  results are indicative of active infection with SARS-CoV-2.  Clinical  correlation with patient history and other diagnostic information is  necessary to determine patient infection status.  Positive results do  not rule out bacterial infection or co-infection with other viruses. If result is PRESUMPTIVE POSTIVE SARS-CoV-2 nucleic acids MAY BE PRESENT.   A presumptive positive result was obtained on the submitted specimen  and confirmed on repeat testing.  While 2019 novel coronavirus  (SARS-CoV-2) nucleic acids may be present in the submitted sample  additional confirmatory testing may be necessary for epidemiological  and / or clinical management purposes  to differentiate between  SARS-CoV-2 and other Sarbecovirus currently known to infect humans.  If clinically indicated additional testing with an alternate test  methodology [phone]) is advised. The SARS-CoV-2 RNA is generally  detectable in upper and lower respiratory sp ecimens during the acute  phase of infection. The expected result is Negative. Fact Sheet for Patients:  StrictlyIdeas.no Fact Sheet for Healthcare Providers: BankingDealers.co.za This test is not yet approved or cleared by the [place] FDA and has been authorized for detection and/or diagnosis of SARS-CoV-2 by FDA under an Emergency Use Authorization (EUA).  This EUA will remain in effect (meaning this test can be used) for the duration of the COVID-19 declaration under Section 564(b)(1) of the Act,  21 U.S.C. section 360bbb-3(b)(1), unless the authorization is terminated or revoked sooner. Performed  at [facility], [address] [place], [place] [address]   Culture, Urine     Status: None   Collection Time: 10/11/18 11:35 AM   Specimen: Urine, Clean Catch  Result Value Ref Range Status   Specimen Description URINE, CLEAN CATCH  Final   Special Requests NONE  Final   Culture   Final    NO GROWTH Performed at [facility], [place] [address] [place], [place] [address]    Report Status 10/12/2018 FINAL  Final    Today   Subjective    Kristianna Krogh today has no headache,no chest abdominal pain,no new weakness tingling or numbness, feels much better wants to go home today.     Objective   Blood pressure 110/63, pulse 72, temperature 98.2 F (36.8 C), temperature source Oral, resp. rate 16, last menstrual period 10/05/2018, SpO2 100 %.   Intake/Output Summary (Last 24 hours) at 10/13/2018 1130 Last data filed at 10/13/2018 0900 Gross per 24 hour  Intake 970 ml  Output --  Net 970 ml    Exam Awake Alert, Oriented x 3, No new F.N deficits, Normal affect [place].AT,PERRAL Supple Neck,No JVD, No cervical lymphadenopathy appriciated.  Symmetrical Chest wall movement, Good air movement bilaterally, CTAB RRR,No Gallops,Rubs or new Murmurs, No Parasternal Heave +ve B.Sounds, Abd Soft, LLQ scar stable, rash improving, Non tender, No organomegaly appriciated, No rebound -guarding or rigidity. No Cyanosis, Clubbing or edema, No new Rash or bruise, no L leg/calf swelling or warmth   Data Review   CBC w Diff:  Lab Results  Component Value Date   WBC 4.1 10/13/2018   HGB 11.1 (L) 10/13/2018   HCT 35.7 (L) 10/13/2018   PLT 310 10/13/2018   LYMPHOPCT 30 10/13/2018   MONOPCT 11 10/13/2018   EOSPCT 10 10/13/2018   BASOPCT 1 10/13/2018    CMP:  Lab Results  Component Value Date   NA 137 10/13/2018   K 3.9 10/13/2018   CL 106 10/13/2018   CO2 23 10/13/2018   BUN 11 10/13/2018   CREATININE 0.60 10/13/2018   PROT 6.3 (L) 10/13/2018   ALBUMIN 3.1 (L)  10/13/2018   BILITOT 0.3 10/13/2018   ALKPHOS 72 10/13/2018   AST 12 (L) 10/13/2018   ALT 8 10/13/2018  .   Total Time in preparing paper work, data evaluation and todays exam - 84 minutes  Lala Lund M.D on 10/13/2018 at 11:30 AM  [facility]   Office  [phone]

## 2018-10-13 NOTE — Discharge Instructions (Signed)
Follow with Primary MD Kathyrn Lass, MD in 7 days   Get CBC, CMP   checked next visit within 1 week by Primary MD    Activity: As tolerated with Full fall precautions use walker/cane & assistance as needed  Disposition Home    Diet: Heart Healthy    Special Instructions: If you have smoked or chewed Tobacco  in the last 2 yrs please stop smoking, stop any regular Alcohol  and or any Recreational drug use.  On your next visit with your primary care physician please Get Medicines reviewed and adjusted.  Please request your Prim.MD to go over all Hospital Tests and Procedure/Radiological results at the follow up, please get all Hospital records sent to your Prim MD by signing hospital release before you go home.  If you experience worsening of your admission symptoms, develop shortness of breath, life threatening emergency, suicidal or homicidal thoughts you must seek medical attention immediately by calling 911 or calling your MD immediately  if symptoms less severe.  You Must read complete instructions/literature along with all the possible adverse reactions/side effects for all the Medicines you take and that have been prescribed to you. Take any new Medicines after you have completely understood and accpet all the possible adverse reactions/side effects.

## 2018-10-13 NOTE — Progress Notes (Signed)
Patient ID: Laura Shah, female   DOB: Jan 22, 1981, 38 y.o.   MRN: [id number] Vitals signs are stable Left leg pain not related to DVT as none are noted on the Doppler Incision looks slightly better Using some topical steroid cream To be discharged today I will follow-up with her on Friday.

## 2018-10-13 NOTE — Progress Notes (Signed)
Physical Therapy Treatment Patient Details Name: Laura Shah MRN: [id number] DOB: 04/21/1980 Today's Date: 10/13/2018    History of Present Illness Pt is a 38 y.o. F with significant PMH of prior discectomy (2019) and blood clots, recent anterior lumbar fusion with abdominal exposure 9/3 who presents with chief complaints of abdominal pain around postop site with puslike fluid. Possible post op cellulitis.    PT Comments    Patient seen for mobility progression. Pt mod I/supervision for functional transfers and gait with use of RW. Pt tolerated gait training distance of 140 ft and able to negotiate stairs simulating home entrance with min A. Pt continue to c/o L calf pain mainly with LE extension. DVT ruled out this am. Continue to progress as tolerated.    Follow Up Recommendations  No PT follow up;Supervision - Intermittent     Equipment Recommendations  None recommended by PT    Recommendations for Other Services       Precautions / Restrictions Precautions Precautions: Fall;Back Precaution Booklet Issued: No Precaution Comments: pt recalls back precautions Required Braces or Orthoses: Spinal Brace Spinal Brace: Lumbar corset;Applied in sitting position Restrictions Weight Bearing Restrictions: No    Mobility  Bed Mobility Overal bed mobility: Modified Independent                Transfers Overall transfer level: Modified independent Equipment used: Rolling walker (2 wheeled)                Ambulation/Gait Ambulation/Gait assistance: Supervision Gait Distance (Feet): 140 Feet Assistive device: Rolling walker (2 wheeled) Gait Pattern/deviations: Step-through pattern;Decreased stride length Gait velocity: decreased   General Gait Details: slow, steady gait   Stairs Stairs: Yes Stairs assistance: Min assist Stair Management: No rails;Step to pattern;Backwards;With walker Number of Stairs: (2 steps X 2 trials ) General stair comments: assist to  stabilize RW; cues for sequencing and technique   Wheelchair Mobility    Modified Rankin (Stroke Patients Only)       Balance Overall balance assessment: Needs assistance   Sitting balance-Leahy Scale: Normal       Standing balance-[name]                              Cognition Arousal/Alertness: Awake/alert Behavior During Therapy: WFL for tasks assessed/performed Overall Cognitive Status: Within Functional Limits for tasks assessed                                        Exercises      General Comments        Pertinent Vitals/Pain Pain Assessment: Faces Faces Pain Scale: Hurts little more Pain Location: back, left calf with LE in extension Pain Descriptors / Indicators: Throbbing;Sharp;Constant Pain Intervention(s): Limited activity within patient's tolerance;Monitored during session;Repositioned    Home Living                      Prior Function            PT Goals (current goals can now be found in the care plan section) Acute Rehab PT Goals Patient Stated Goal: go home in a few days Progress towards PT goals: Progressing toward goals    Frequency    Min 3X/week      PT Plan Current plan remains appropriate    Co-evaluation  AM-PAC PT "6 Clicks" Mobility   Outcome Measure  Help needed turning from your back to your side while in a flat bed without using bedrails?: None Help needed moving from lying on your back to sitting on the side of a flat bed without using bedrails?: None Help needed moving to and from a bed to a chair (including a wheelchair)?: None Help needed standing up from a chair using your arms (e.g., wheelchair or bedside chair)?: None Help needed to walk in hospital room?: None Help needed climbing 3-5 steps with a railing? : A Little 6 Click Score: 23    End of Session Equipment Utilized During Treatment: Back brace Activity Tolerance: Patient tolerated  treatment well Patient left: in bed;with call bell/phone within reach Nurse Communication: Mobility status PT Visit Diagnosis: Pain;Difficulty in walking, not elsewhere classified (R26.2);Other abnormalities of gait and mobility (R26.89) Pain - part of body: (back)     Time: 0370-4888 PT Time Calculation (min) (ACUTE ONLY): 26 min  Charges:  $Gait Training: 23-37 mins                     Earney Navy, PTA Acute Rehabilitation Services Pager: [phone] Office: [phone]     Darliss Cheney 10/13/2018, 1:39 PM

## 2018-10-13 NOTE — Plan of Care (Signed)

## 2018-10-13 NOTE — Progress Notes (Signed)
Dider,NT transported pt from 5N to [facility] discharge unit, pt AOx4 and comfortable, IV has been removed went over discharge instructions with the patient she stated understanding. Pt sister downstairs to pick up pt, belongings packed. 2 prescriptions given to patient.

## 2018-10-13 NOTE — Progress Notes (Signed)
Bilateral lower extremity venous duplex completed. Preliminary results in Chart review CV proc. [facility], [place] 10/13/2018 11:16 PM

## 2018-10-16 LAB — CULTURE, BLOOD (ROUTINE X 2): Culture: NO GROWTH

## 2018-10-25 NOTE — Progress Notes (Signed)
Cardiology Office Note:    Date:  10/27/2018   ID:  Laura Shah, DOB 10/01/1980, MRN [id number]  PCP:  Sigmund Hazel, MD  Cardiologist:  Norman Herrlich, MD   Referring MD: Sigmund Hazel, MD  ASSESSMENT:    1. Shortness of breath   2. Palpitations    PLAN:    In order of problems listed above:  1. Her symptoms of shortness of breath imply heart failure but she has no physical findings or radiographic evidence.  We will check a proBNP level echocardiogram and recheck a d-dimer and if severely elevated may benefit from a repeat chest CTA. 2. She has a vague history of tachycardia is having episodes of rapid heart rhythm will use a 1 week ZIO monitor to assess for arrhythmia.  Next appointment in 3 to 4 weeks   Medication Adjustments/Labs and Tests Ordered: Current medicines are reviewed at length with the patient today.  Concerns regarding medicines are outlined above.  No orders of the defined types were placed in this encounter.  No orders of the defined types were placed in this encounter.    Chief Complaint  Patient presents with  . Shortness of Breath  . Palpitations    History of Present Illness:    Laura Shah is a 38 y.o. 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 On chart review a CT of the chest abdomen pelvis May 2019 did not show atherosclerosis of the aorta or coronary arteries.  She had 3 emergency room visits in 2013 and 2014 for chest pain unremarkable EKGs and was discharged from the emergency room on each occasion. She had a recent chest CTA pulmonary embolism protocol performed 10/10/2018 no evidence of pulmonary embolism cardiovascular structures are normal she had no pulmonary infiltration.  Lower extremity venous duplex bilateral 10/13/2018 no findings of the deep vein thrombosis.  She has had a difficult time since her surgery and a readmission with abdominal wall cellulitis.  She is finds her self quite fatigued dizzy notices her heart racing she is short of breath with any activity even ADLs and wakes up at nighttime short of breath and has to sit up on the side of the bed.  She has no known history of heart disease although she was told she had tachycardia in the past and had a sleep apnea evaluation prior to her weight loss surgery which was unremarkable.  She has had no chest pain or syncope.  She does take narcotic twice a day had no fever chills cough or wheezing.  She has no peripheral edema.  No known history of congenital rheumatic heart disease Past Medical History:  Diagnosis Date  . Acute pancreatitis 03/16/2015  . Anemia   . Anxiety   . Back pain   . Cellulitis 09/2018   abdomen   . Constipation by delayed colonic transit   . Fatigue 10/27/2018  . GERD (gastroesophageal reflux disease)   . Heart valve regurgitation    trivial PR, MR, TR 2014 echo [facility])  . 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 Ileus (HCC)   . Interstitial cystitis   . Irritable bowel syndrome with constipation   . Lumbar herniated disc 04/17/2017   L4/L5. Ruptured.  . OSA (obstructive sleep apnea) 03/16/2015  .  Palpitations 10/27/2018  . Pancreatitis   . 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 Right leg weakness    and numbness   . Shortness of breath 10/27/2018  . Tachycardia     Past Surgical History:  Procedure Laterality Date  . ABDOMINAL EXPOSURE N/A 10/02/2018   Procedure: ABDOMINAL EXPOSURE;  Surgeon: Larina EarthlyEarly, Todd F, MD;  [facility];  Service: Vascular;  Laterality: N/A;  . ANTERIOR LUMBAR FUSION N/A 10/02/2018   Procedure: Lumbar four-five Anterior lumbar interbody;  Surgeon: Barnett AbuElsner, Henry, MD;  Location: [facility] OR;  Service: Neurosurgery;  Laterality: N/A;  . APPENDECTOMY    . BARIATRIC SURGERY    . CESAREAN SECTION    .  CHOLECYSTECTOMY    . ESOPHAGOGASTRODUODENOSCOPY (EGD) WITH PROPOFOL N/A 03/18/2015   Procedure: ESOPHAGOGASTRODUODENOSCOPY (EGD) WITH PROPOFOL;  Surgeon: Sherrilyn RistHenry L Danis III, MD;  [facility];  Service: Endoscopy;  Laterality: N/A;  . GASTRECTOMY     laparoscopic sleeve gastrectomy 01/27/15  . Leg Pain    . LUMBAR DISC SURGERY     L4-5 discectomy 05/2017  . OVARIAN CYST REMOVAL     Ruptured    Current Medications: Current Meds  Medication Sig  . acetaminophen (TYLENOL) 325 MG tablet Take 2 tablets (650 mg total) by mouth every 6 (six) hours as needed for mild pain or moderate pain (or Fever >/= 101).  . Cholecalciferol (VITAMIN D) 50 MCG (2000 UT) CAPS Take 1 capsule by mouth daily.  . clobetasol cream (TEMOVATE) 0.05 % Apply topically 3 (three) times daily. To affected skin in the left groin and thigh area.  . cyanocobalamin (,VITAMIN B-12,) 1000 MCG/ML injection Inject 1,000 mcg into the muscle every 14 (fourteen) days.  . diphenhydrAMINE (BENADRYL) 50 MG tablet Take 0.5 tablets (25 mg total) by mouth every 8 (eight) hours as needed for itching.  Marland Kitchen. HYDROcodone-acetaminophen (NORCO/VICODIN) 5-325 MG tablet Take 1 tablet by mouth every 6 (six) hours as needed for severe pain.  . methocarbamol (ROBAXIN) 500 MG tablet Take 1 tablet (500 mg total) by mouth every 6 (six) hours as needed for muscle spasms.  . pantoprazole (PROTONIX) 40 MG tablet Take 1 tablet (40 mg total) by mouth daily.     Allergies:   Reglan [metoclopramide] and Morphine and related   Social History   Socioeconomic History  . Marital status: Single    Spouse name: Not on file  . Number of children: 2  . Years of education: Grad   . Highest education level: Not on file  Occupational History  . Not on file  Social Needs  . Financial resource strain: Not on file  . Food insecurity    Worry: Not on file    Inability: Not on file  . Transportation needs    Medical: Not on file    Non-medical: Not on file   Tobacco Use  . Smoking status: Never Smoker  . Smokeless tobacco: Never Used  Substance and Sexual Activity  . Alcohol use: No    Alcohol/week: 0.0 standard drinks    Frequency: Never  . Drug use: No  . Sexual activity: Not on file  Lifestyle  . Physical activity    Days per week: Not on file    Minutes per session: Not on file  . Stress: Not on file  Relationships  . Social Musicianconnections    Talks on phone: Not on file    Gets together: Not on file    Attends religious service: Not on file  Active member of club or organization: Not on file    Attends meetings of clubs or organizations: Not on file    Relationship status: Not on file  Other Topics Concern  . Not on file  Social History Narrative   Drinks about 1-2 cans of soda a day      Family History: The patient's family history includes Breast cancer in her maternal grandmother and paternal grandmother; Diabetes in her father and paternal grandmother; Hypertension in her father.  ROS:   Review of Systems  Constitution: Positive for malaise/fatigue.  HENT: Negative.   Eyes: Negative.   Cardiovascular: Positive for dyspnea on exertion, orthopnea and palpitations.  Respiratory: Positive for shortness of breath.   Endocrine: Negative.   Hematologic/Lymphatic: Negative.   Skin: Negative.   Musculoskeletal: Positive for back pain.  Gastrointestinal: Negative.   Genitourinary: Negative.   Neurological: Negative.   Psychiatric/Behavioral: Negative.    Please see the history of present illness.     All other systems reviewed and are negative.  EKGs/Labs/Other Studies Reviewed:    The following studies were reviewed today:   EKG:  EKG is  ordered today.  The ekg ordered today is personally reviewed and demonstrates sinus rhythm and is normal  Recent Labs: 10/13/2018: ALT 8; BUN 11; Creatinine, Ser 0.60; Hemoglobin 11.1; Magnesium 2.0; Platelets 310; Potassium 3.9; Sodium 137  Recent Lipid Panel    Component Value  Date/Time   TRIG 86 03/16/2015 1424    Physical Exam:    VS:  BP 118/80 (BP Location: Left Arm, Patient Position: Sitting, Cuff Size: Normal)   Pulse 90   Temp (!) 97 F (36.1 C)   Ht 5\' 2"  (1.575 m)   Wt 151 lb 6.4 oz (68.7 kg)   LMP 10/05/2018   SpO2 98%   BMI 27.69 kg/m     Wt Readings from Last 3 Encounters:  10/27/18 151 lb 6.4 oz (68.7 kg)  09/29/18 151 lb 4.8 oz (68.6 kg)  08/25/18 155 lb 4 oz (70.4 kg)     GEN:  Well nourished, well developed in no acute distress HEENT: Normal NECK: No JVD; No carotid bruits LYMPHATICS: No lymphadenopathy CARDIAC: RRR, no murmurs, rubs, gallops RESPIRATORY:  Clear to auscultation without rales, wheezing or rhonchi  ABDOMEN: Soft, non-tender, non-distended MUSCULOSKELETAL:  No edema; No deformity  SKIN: Warm and dry NEUROLOGIC:  Alert and oriented x 3 PSYCHIATRIC:  Normal affect     Signed, Shirlee More, MD  10/27/2018 2:22 PM    [facility]

## 2018-10-27 ENCOUNTER — Other Ambulatory Visit: Payer: Self-pay

## 2018-10-27 ENCOUNTER — Ambulatory Visit (INDEPENDENT_AMBULATORY_CARE_PROVIDER_SITE_OTHER): Payer: BC Managed Care – PPO | Admitting: Cardiology

## 2018-10-27 VITALS — BP 118/80 | HR 90 | Temp 97.0°F | Ht 62.0 in | Wt 151.4 lb

## 2018-10-27 DIAGNOSIS — R002 Palpitations: Secondary | ICD-10-CM | POA: Diagnosis not present

## 2018-10-27 DIAGNOSIS — R5383 Other fatigue: Secondary | ICD-10-CM

## 2018-10-27 DIAGNOSIS — R0602 Shortness of breath: Secondary | ICD-10-CM

## 2018-10-27 HISTORY — DX: Other fatigue: R53.83

## 2018-10-27 HISTORY — DX: Shortness of breath: R06.02

## 2018-10-27 HISTORY — DX: Palpitations: R00.2

## 2018-10-27 NOTE — Patient Instructions (Signed)
Medication Instructions:  Your physician recommends that you continue on your current medications as directed. Please refer to the Current Medication list given to you today.  If you need a refill on your cardiac medications before your next appointment, please call your pharmacy.   Lab work: Your physician recommends that you return for lab work today: d-dimer, ProBNP.   If you have labs (blood work) drawn today and your tests are completely normal, you will receive your results only by: Marland Kitchen MyChart Message (if you have MyChart) OR . A paper copy in the mail If you have any lab test that is abnormal or we need to change your treatment, we will call you to review the results.  Testing/Procedures: You had an EKG today.   Your physician has requested that you have an echocardiogram. Echocardiography is a painless test that uses sound waves to create images of your heart. It provides your doctor with information about the size and shape of your heart and how well your heart's chambers and valves are working. This procedure takes approximately one hour. There are no restrictions for this procedure.  Your physician has recommended that you wear a ZIO monitor. ZIO monitors are medical devices that record the heart's electrical activity. Doctors most often use these monitors to diagnose arrhythmias. Arrhythmias are problems with the speed or rhythm of the heartbeat. The monitor is a small, portable device. You can wear one while you do your normal daily activities. This is usually used to diagnose what is causing palpitations/syncope (passing out). Wear for 7 days.  Follow-Up: At [facility], you and your health needs are our priority.  As part of our continuing mission to provide you with exceptional heart care, we have created designated Provider Care Teams.  These Care Teams include your primary Cardiologist (physician) and Advanced Practice Providers (APPs -  Physician Assistants and Nurse  Practitioners) who all work together to provide you with the care you need, when you need it. You will need a follow up appointment in 4 weeks.       Echocardiogram An echocardiogram is a procedure that uses painless sound waves (ultrasound) to produce an image of the heart. Images from an echocardiogram can provide important information about:  Signs of coronary artery disease (CAD).  Aneurysm detection. An aneurysm is a weak or damaged part of an artery wall that bulges out from the normal force of blood pumping through the body.  Heart size and shape. Changes in the size or shape of the heart can be associated with certain conditions, including heart failure, aneurysm, and CAD.  Heart muscle function.  Heart valve function.  Signs of a past heart attack.  Fluid buildup around the heart.  Thickening of the heart muscle.  A tumor or infectious growth around the heart valves. Tell a health care provider about:  Any allergies you have.  All medicines you are taking, including vitamins, herbs, eye drops, creams, and over-the-counter medicines.  Any blood disorders you have.  Any surgeries you have had.  Any medical conditions you have.  Whether you are pregnant or may be pregnant. What are the risks? Generally, this is a safe procedure. However, problems may occur, including:  Allergic reaction to dye (contrast) that may be used during the procedure. What happens before the procedure? No specific preparation is needed. You may eat and drink normally. What happens during the procedure?   An IV tube may be inserted into one of your veins.  You may  receive contrast through this tube. A contrast is an injection that improves the quality of the pictures from your heart.  A gel will be applied to your chest.  A wand-like tool (transducer) will be moved over your chest. The gel will help to transmit the sound waves from the transducer.  The sound waves will harmlessly  bounce off of your heart to allow the heart images to be captured in real-time motion. The images will be recorded on a computer. The procedure may vary among health care providers and hospitals. What happens after the procedure?  You may return to your normal, everyday life, including diet, activities, and medicines, unless your health care provider tells you not to do that. Summary  An echocardiogram is a procedure that uses painless sound waves (ultrasound) to produce an image of the heart.  Images from an echocardiogram can provide important information about the size and shape of your heart, heart muscle function, heart valve function, and fluid buildup around your heart.  You do not need to do anything to prepare before this procedure. You may eat and drink normally.  After the echocardiogram is completed, you may return to your normal, everyday life, unless your health care provider tells you not to do that. This information is not intended to replace advice given to you by your health care provider. Make sure you discuss any questions you have with your health care provider. Document Released: 01/13/2000 Document Revised: 05/08/2018 Document Reviewed: 02/18/2016 Elsevier Patient Education  2020 [employer].

## 2018-10-28 LAB — PRO B NATRIURETIC PEPTIDE: NT-Pro BNP: 28 pg/mL (ref 0–130)

## 2018-10-28 LAB — D-DIMER, QUANTITATIVE: D-DIMER: 0.84 mg/L FEU — ABNORMAL HIGH (ref 0.00–0.49)

## 2018-10-30 ENCOUNTER — Ambulatory Visit (HOSPITAL_BASED_OUTPATIENT_CLINIC_OR_DEPARTMENT_OTHER)
Admission: RE | Admit: 2018-10-30 | Discharge: 2018-10-30 | Disposition: A | Discharge status: Home / Self Care | Payer: BC Managed Care – PPO | Source: Ambulatory Visit | Attending: Cardiology | Admitting: Cardiology

## 2018-10-30 DIAGNOSIS — R0602 Shortness of breath: Secondary | ICD-10-CM | POA: Diagnosis not present

## 2018-10-30 DIAGNOSIS — R002 Palpitations: Secondary | ICD-10-CM | POA: Diagnosis not present

## 2018-10-30 NOTE — Progress Notes (Signed)
  Echocardiogram 2D Echocardiogram has been performed.  Cardell Peach 10/30/2018, 3:02 PM

## 2018-11-01 ENCOUNTER — Ambulatory Visit (INDEPENDENT_AMBULATORY_CARE_PROVIDER_SITE_OTHER): Payer: BC Managed Care – PPO

## 2018-11-01 DIAGNOSIS — R0602 Shortness of breath: Secondary | ICD-10-CM

## 2018-11-01 DIAGNOSIS — R002 Palpitations: Secondary | ICD-10-CM | POA: Diagnosis not present

## 2018-11-02 ENCOUNTER — Emergency Department (HOSPITAL_BASED_OUTPATIENT_CLINIC_OR_DEPARTMENT_OTHER)
Admission: EM | Admit: 2018-11-02 | Discharge: 2018-11-02 | Disposition: A | Discharge status: Home / Self Care | Payer: BC Managed Care – PPO | Attending: Emergency Medicine | Admitting: Emergency Medicine

## 2018-11-02 ENCOUNTER — Emergency Department (HOSPITAL_BASED_OUTPATIENT_CLINIC_OR_DEPARTMENT_OTHER): Payer: BC Managed Care – PPO

## 2018-11-02 ENCOUNTER — Other Ambulatory Visit: Payer: Self-pay

## 2018-11-02 ENCOUNTER — Encounter (HOSPITAL_BASED_OUTPATIENT_CLINIC_OR_DEPARTMENT_OTHER): Payer: Self-pay | Admitting: Emergency Medicine

## 2018-11-02 DIAGNOSIS — M79661 Pain in right lower leg: Secondary | ICD-10-CM | POA: Diagnosis not present

## 2018-11-02 DIAGNOSIS — Z79899 Other long term (current) drug therapy: Secondary | ICD-10-CM | POA: Diagnosis not present

## 2018-11-02 DIAGNOSIS — M79669 Pain in unspecified lower leg: Secondary | ICD-10-CM

## 2018-11-02 DIAGNOSIS — R748 Abnormal levels of other serum enzymes: Secondary | ICD-10-CM | POA: Diagnosis not present

## 2018-11-02 LAB — CBC WITH DIFFERENTIAL/PLATELET
Abs Immature Granulocytes: 0 10*3/uL (ref 0.00–0.07)
Basophils Absolute: 0 10*3/uL (ref 0.0–0.1)
Basophils Relative: 1 %
Eosinophils Absolute: 0.2 10*3/uL (ref 0.0–0.5)
Eosinophils Relative: 4 %
HCT: 36.8 % (ref 36.0–46.0)
Hemoglobin: 11.3 g/dL — ABNORMAL LOW (ref 12.0–15.0)
Immature Granulocytes: 0 %
Lymphocytes Relative: 33 %
Lymphs Abs: 1.3 10*3/uL (ref 0.7–4.0)
MCH: 26.3 pg (ref 26.0–34.0)
MCHC: 30.7 g/dL (ref 30.0–36.0)
MCV: 85.8 fL (ref 80.0–100.0)
Monocytes Absolute: 0.4 10*3/uL (ref 0.1–1.0)
Monocytes Relative: 11 %
Neutro Abs: 2.1 10*3/uL (ref 1.7–7.7)
Neutrophils Relative %: 51 %
Platelets: 229 10*3/uL (ref 150–400)
RBC: 4.29 MIL/uL (ref 3.87–5.11)
RDW: 14.9 % (ref 11.5–15.5)
WBC: 4.1 10*3/uL (ref 4.0–10.5)
nRBC: 0 % (ref 0.0–0.2)

## 2018-11-02 LAB — COMPREHENSIVE METABOLIC PANEL
ALT: 12 U/L (ref 0–44)
AST: 16 U/L (ref 15–41)
Albumin: 3.7 g/dL (ref 3.5–5.0)
Alkaline Phosphatase: 93 U/L (ref 38–126)
Anion gap: 7 (ref 5–15)
BUN: 10 mg/dL (ref 6–20)
CO2: 26 mmol/L (ref 22–32)
Calcium: 8.9 mg/dL (ref 8.9–10.3)
Chloride: 105 mmol/L (ref 98–111)
Creatinine, Ser: 0.66 mg/dL (ref 0.44–1.00)
GFR calc Af Amer: >60 mL/min (ref >60)
GFR calc non Af Amer: >60 mL/min (ref >60)
Glucose, Bld: 92 mg/dL (ref 70–99)
Potassium: 3.9 mmol/L (ref 3.5–5.1)
Sodium: 138 mmol/L (ref 135–145)
Total Bilirubin: 0.4 mg/dL (ref 0.3–1.2)
Total Protein: 7.3 g/dL (ref 6.5–8.1)

## 2018-11-02 LAB — LIPASE, BLOOD: Lipase: 63 U/L — ABNORMAL HIGH (ref 11–51)

## 2018-11-02 NOTE — ED Provider Notes (Signed)
[facility] Provider Note   CSN: [id number] Arrival date & time: 11/02/18  1300     History   Chief Complaint Chief Complaint  Patient presents with   Leg Pain    HPI Laura Shah is a 38 y.o. female.     The history is provided by the patient and medical records. No language interpreter was used.  Leg Pain  Laura Shah is a 38 y.o. female  with a PMH as listed below who presents to the Emergency Department complaining of right calf pain which began about 2 or 3 days ago.  Patient states that she had a d-dimer done by her cardiologist for persistent shortness of breath which was elevated at 0.84.  She states that he ordered an echo of her heart, but was not going to do any further testing as far as her elevated d-dimer goes.  Per chart review, cardiology notes state that "if severely elevated may benefit from a repeat chest CTA".  Today, she denies any chest pain.  No worsening of her shortness of breath.  She is concerned due to right posterior knee pain which radiates up towards her mid thigh.  Denies any swelling.  She did have surgery on her back about a month ago.  She was on anticoagulation while in the hospital, but not currently.  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  Seen by hematology after this and anticoagulation was discontinued after 3 months.   Additionally, after evaluation was nearly complete, she tells me that she just started having generalized abdominal pain.  Denies any nausea, vomiting, diarrhea, constipation or blood in stool.  No fevers.  Past Medical History:  Diagnosis Date   Acute pancreatitis 03/16/2015   Anemia    Anxiety    Back pain    Cellulitis 09/2018   abdomen    Constipation by delayed colonic transit    Fatigue 10/27/2018   GERD (gastroesophageal reflux disease)    Heart valve regurgitation    trivial PR, MR, TR 2014 echo [facility])   Hx  of blood clots    on liver - thrombosis right portal vein branches 01/2015    Ileus (HCC)    Interstitial cystitis    Irritable bowel syndrome with constipation    Lumbar herniated disc 04/17/2017   L4/L5. Ruptured.   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   Constipation by delayed colonic transit    Acute pancreatitis 03/16/2015   OSA (obstructive sleep apnea) 03/16/2015   Anemia 03/16/2015   Hypokalemia 03/16/2015   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   Epigastric abdominal pain    RUQ pain    Elevated LFTs    Irritable bowel syndrome with constipation    Injury of hand, right 08/03/2014   Interstitial cystitis    Tachycardia     Past Surgical History:  Procedure Laterality Date   ABDOMINAL EXPOSURE N/A 10/02/2018   Procedure: ABDOMINAL EXPOSURE;  Surgeon: Rosetta Posner, MD;  Location: [facility] OR;  Service: Vascular;  Laterality: N/A;   ANTERIOR LUMBAR FUSION N/A 10/02/2018   Procedure: Lumbar four-five Anterior lumbar interbody;  Surgeon: Kristeen Miss, MD;  Location: [place];  Service: Neurosurgery;  Laterality: N/A;   APPENDECTOMY     BARIATRIC SURGERY     CESAREAN SECTION     CHOLECYSTECTOMY     ESOPHAGOGASTRODUODENOSCOPY (EGD) WITH PROPOFOL N/A 03/18/2015   Procedure: ESOPHAGOGASTRODUODENOSCOPY (EGD) WITH PROPOFOL;  Surgeon: Doran Stabler, MD;  Location: [place];  Service: Endoscopy;   Laterality: N/A;   GASTRECTOMY     laparoscopic sleeve gastrectomy 01/27/15   Leg Pain     LUMBAR DISC SURGERY     L4-5 discectomy 05/2017   OVARIAN CYST REMOVAL     Ruptured     OB History   No obstetric history on file.      Home Medications    Prior to Admission medications   Medication Sig Start Date End Date Taking? Authorizing Provider  acetaminophen (TYLENOL) 325 MG tablet Take 2 tablets (650 mg total) by mouth every 6 (six) hours as needed for mild pain or moderate pain (or Fever >/= 101). 10/13/18   Thurnell Lose, MD  Cholecalciferol (VITAMIN D) 50 MCG (2000 UT) CAPS Take 1 capsule by mouth daily.    [provider]  clobetasol cream (TEMOVATE) 0.05 % Apply topically 3 (three) times daily. To affected skin in the left groin and thigh area. 10/13/18   Thurnell Lose, MD  cyanocobalamin (,VITAMIN B-12,) 1000 MCG/ML injection Inject 1,000 mcg into the muscle every 14 (fourteen) days.    [provider]  diphenhydrAMINE (BENADRYL) 50 MG tablet Take 0.5 tablets (25 mg total) by mouth every 8 (eight) hours as needed for itching. 10/13/18   Thurnell Lose, MD  HYDROcodone-acetaminophen (NORCO/VICODIN) 5-325 MG tablet Take 1 tablet by mouth every 6 (six) hours as needed for severe pain. 10/13/18   Thurnell Lose, MD  methocarbamol (ROBAXIN) 500 MG tablet Take 1 tablet (500 mg total) by mouth every 6 (six) hours as needed for muscle spasms. 10/13/18   Thurnell Lose, MD  pantoprazole (PROTONIX) 40 MG tablet Take 1 tablet (40 mg total) by mouth daily. 8/56/31   Delora Fuel, MD  Vitamin D, Ergocalciferol, (DRISDOL) 1.25 MG (50000 UT) CAPS capsule 1 CAPSULE ONCE A WEEK FOR 12 WEEKS ORALLY 10/15/18   [provider]    Family History Family History  Problem Relation Age of Onset   Breast cancer Maternal Grandmother    Breast cancer Paternal Grandmother    Diabetes Paternal Grandmother    Diabetes Father    Hypertension Father      Social History Social History   Tobacco Use   Smoking status: Never Smoker   Smokeless tobacco: Never Used  Substance Use Topics   Alcohol use: No    Alcohol/week: 0.0 standard drinks    Frequency: Never   Drug use: No     Allergies   Reglan [metoclopramide] and Morphine and related   Review of Systems Review of Systems  Cardiovascular: Negative for chest pain, palpitations and leg swelling.  Gastrointestinal: Positive for abdominal pain. Negative for blood in stool, constipation, diarrhea, nausea and vomiting.  Musculoskeletal: Positive for myalgias.  All other systems reviewed and are negative.    Physical Exam Updated Vital Signs BP 128/89 (BP Location:  Right Arm)    Pulse 80    Temp 98.2 F (36.8 C) (Oral)    Resp 18    Ht _0  (1.575 m)    Wt 68 kg    LMP 10/05/2018    SpO2 99%    BMI 27.44 kg/m   Physical Exam Vitals signs and nursing note reviewed.  Constitutional:      General: She is not in acute distress.    Appearance: She is well-developed.  HENT:     Head: Normocephalic and atraumatic.  Neck:     Musculoskeletal: Neck supple.  Cardiovascular:     Rate and Rhythm: Normal rate and regular rhythm.     Heart sounds: Normal heart sounds. No murmur.  Pulmonary:     Effort: Pulmonary effort is normal. No respiratory distress.     Breath sounds: Normal breath sounds.  Abdominal:     General: There is no distension.     Palpations: Abdomen is soft.     Comments: Generalized abdominal tenderness without focality.  No rebound or guarding.  No flank or CVA tenderness.  Musculoskeletal:     Comments: Mild tenderness to the posterior right knee and up to the medial right thigh.  No overlying skin changes or warmth.  No swelling appreciated.  Full range of motion and strength.   Skin:    General: Skin is warm and dry.  Neurological:     Mental Status: She is alert and oriented to person, place, and time.     Comments: Bilateral lower extremities are  neurovascularly intact.      ED Treatments / Results  Labs (all labs ordered are listed, but only abnormal results are displayed) Labs Reviewed  CBC WITH DIFFERENTIAL/PLATELET - Abnormal; Notable for the following components:      Result Value   Hemoglobin 11.3 (*)    All other components within normal limits  LIPASE, BLOOD - Abnormal; Notable for the following components:   Lipase 63 (*)    All other components within normal limits  COMPREHENSIVE METABOLIC PANEL    EKG None  Radiology US Venous Img Lower Unilateral Right  Result Date: 11/02/2018 CLINICAL DATA:  Right calf pain EXAM: RIGHT LOWER EXTREMITY VENOUS DOPPLER ULTRASOUND TECHNIQUE: Gray-scale sonography with graded compression, as well as color Doppler and duplex ultrasound were performed to evaluate the lower extremity deep venous systems from the level of the common femoral vein and including the common femoral, femoral, profunda femoral, popliteal and calf veins including the posterior tibial, peroneal and gastrocnemius veins when visible. The superficial great saphenous vein was also interrogated. Spectral Doppler was utilized to evaluate flow at rest and with distal augmentation maneuvers in the common femoral, femoral and popliteal veins. COMPARISON:  None. FINDINGS: Contralateral Common Femoral Vein: Respiratory phasicity is normal and symmetric with the symptomatic side. No evidence of thrombus. Normal compressibility. Common Femoral Vein: No evidence of thrombus. Normal compressibility, respiratory phasicity and response to augmentation. Saphenofemoral Junction: No evidence of thrombus. Normal compressibility and flow on color Doppler imaging. Profunda Femoral Vein: No evidence of thrombus. Normal compressibility and flow on color Doppler imaging. Femoral Vein: No evidence of thrombus. Normal compressibility, respiratory phasicity and response to augmentation. Popliteal Vein: No evidence of thrombus. Normal  compressibility, respiratory phasicity and response to augmentation. Calf Veins: No evidence of thrombus. Normal compressibility and flow on color Doppler imaging. Superficial Great Saphenous Vein: No evidence of thrombus. Normal compressibility. Venous Reflux:  None. Other Findings:  None. IMPRESSION: No evidence  of deep venous thrombosis. Electronically Signed   By: Eddie Candle M.D.   On: 11/02/2018 14:33    Procedures Procedures (including critical care time)  Medications Ordered in ED Medications - No data to display   Initial Impression / Assessment and Plan / ED Course  I have reviewed the triage vital signs and the nursing notes.  Pertinent labs & imaging results that were available during my care of the patient were reviewed by me and considered in my medical decision making (see chart for details).       JOSSIE SMOOT is a 38 y.o. female who presents to ED for right calf pain, concerned that she may have a blood clot.  She has a history of provoked thrombus of her right portal vein back in 2017. Patient states that she had a d-dimer done by her cardiologist for persistent shortness of breath which was elevated at 0.84.  She states that he ordered an echo of her heart, but was not going to do any further testing as far as her elevated d-dimer goes.  Per chart review, cardiology notes state that "if severely elevated may benefit from a repeat chest CTA" I ordered a DVT study which was negative.  Given her negative DVT study as well as cardiology statements as above, do not feel that we need to obtain a CTA of her chest at this time.  Discussed this with patient who agrees to defer further treatment for such to cardiology.  Upon telling her with the results of her DVT study, she tells me she has now developed generalized abdominal pain.  She does have generalized tenderness without rebound or guarding.  Non-surgical exam.  Labs were reviewed: Normal AST/ALT and alk phos. Does have mildly  elevated lipase at 63. Has a history of pancreatitis. Overall she appears quite well.  She is not having any nausea or vomiting.  She feels comfortable with symptomatic home care and discharge - calling her GI doctor in the morning.  We discussed reasons for her to return to the emergency department sooner as well as home care instructions such as liquid diet for now.  All questions were answered.  Discharged home in satisfactory condition.    Final Clinical Impressions(s) / ED Diagnoses   Final diagnoses:  Calf pain  Elevated lipase    ED Discharge Orders    None       [place], Ozella Almond, PA-C 11/02/18 1612    Quintella Reichert, MD 11/04/18 [phone]

## 2018-11-02 NOTE — ED Triage Notes (Signed)
R calf pain x 2 days. Had back surgery 1 month ago.

## 2018-11-02 NOTE — ED Notes (Signed)
Patient taken to u/s, gown left on bed for patient

## 2018-11-02 NOTE — ED Notes (Addendum)
Pt reports hx of "blood clots". Pt also states a feeling of numbness on left side of forehead. Also reports left eye discharge when awakening this am. Pt ao x 4. No deficit changes arms/legs

## 2018-11-02 NOTE — Discharge Instructions (Signed)
It was my pleasure taking care of you today!   Call your cardiologist and GI doctor in the morning.   Return to ER for new or worsening symptoms, any additional concerns.

## 2018-11-14 NOTE — Progress Notes (Signed)
Cardiology Office Note:    Date:  11/17/2018   ID:  Laura Deeraishika S Zeien, DOB 1981/01/28, MRN [id number]  PCP:  Sigmund HazelMiller, Lisa, MD  Cardiologist:  Norman HerrlichBrian Munley, MD    Referring MD: Sigmund HazelMiller, Lisa, MD    ASSESSMENT:    1. Shortness of breath   2. Palpitations    PLAN:    In order of problems listed above:  1. Her symptoms of shortness of breath and palpitation are improved no evidence of venous thromboembolism or heart failure.  At this time I would not repeat a chest CTA he is improving back to my office as needed.  We discussed her D-dimer I think it is nonspecific in the clinical context after shared decision making of agreed not to repeat a chest CTA.   Next appointment: As needed   Medication Adjustments/Labs and Tests Ordered: Current medicines are reviewed at length with the patient today.  Concerns regarding medicines are outlined above.  No orders of the defined types were placed in this encounter.  No orders of the defined types were placed in this encounter.   Chief Complaint  Patient presents with  . Follow-up  . Shortness of Breath    History of Present Illness:    Laura Shah is a 38 y.o. 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 On chart review a CT of the chest abdomen pelvis May 2019 did not show atherosclerosis of the aorta or coronary arteries.  She had 3 emergency room visits in 2013 and 2014 for chest pain unremarkable EKGs and was discharged from the emergency room on each occasion. She had a recent chest CTA pulmonary embolism protocol performed 10/10/2018 no evidence of pulmonary embolism cardiovascular structures are normal she had no pulmonary infiltration.  Lower extremity venous duplex bilateral 10/13/2018 no findings of the deep vein thrombosis. She was seen in ED 11/02/2018 with calf pain and a normal venous duplex and abdominal pain with an elevated lipase.  Compliance with diet, lifestyle and medications: Yes  Is improved has returned back to school presently no shortness of breath chest pain or palpitation.  I reviewed her studies including the duplex lower extremities cardiac echo proBNP level there is no evidence of venous thromboembolism or heart failure.  At this time I would not repeat a chest CTA for pulmonary embolism without a clinical driver and I think her D-dimer level is nonspecific and may well be related to pancreatitis.  Comfortable with this approach I will see back in the office as needed.  Palpitation is a frequent problem week he is ambulatory heart rhythm monitor  11/02/2018: Study Result  CLINICAL DATA:  Right calf pain  EXAM: RIGHT LOWER EXTREMITY VENOUS DOPPLER ULTRASOUND  TECHNIQUE: Gray-scale sonography with graded compression, as well as color Doppler and duplex ultrasound were performed to evaluate the lower extremity deep venous systems from the level of the common femoral vein and including the common femoral, femoral, profunda femoral, popliteal and calf veins including the posterior tibial, peroneal and gastrocnemius veins when visible. The superficial great saphenous vein was also interrogated. Spectral Doppler was utilized to evaluate flow at rest and with distal augmentation maneuvers in the common femoral, femoral and popliteal veins.  COMPARISON:  None.  FINDINGS: Contralateral Common Femoral Vein: Respiratory phasicity is normal and symmetric with the symptomatic side. No evidence of thrombus. Normal compressibility.  Common Femoral Vein: No evidence of thrombus. Normal compressibility, respiratory phasicity  and response to augmentation.  Saphenofemoral Junction: No evidence of thrombus. Normal compressibility and flow on color Doppler imaging.  Profunda Femoral Vein: No evidence of thrombus. Normal compressibility and flow on color Doppler imaging.  Femoral Vein: No evidence of  thrombus. Normal compressibility, respiratory phasicity and response to augmentation.  Popliteal Vein: No evidence of thrombus. Normal compressibility, respiratory phasicity and response to augmentation.  Calf Veins: No evidence of thrombus. Normal compressibility and flow on color Doppler imaging.  Superficial Great Saphenous Vein: No evidence of thrombus. Normal compressibility.  Venous Reflux:  None.  Other Findings:  None.  IMPRESSION: No evidence of deep venous thrombosis.   10/27/2018:  Ref Range & Units 2wk ago  NT-Pro BNP 0 - 130 pg/mL 28    Echo 10/30/2018: MPRESSIONS  1. Left ventricular ejection fraction, by visual estimation, is 60 to 65%. The left ventricle has normal function. Normal left ventricular size. There is no left ventricular hypertrophy.  2. Global right ventricle has normal systolic function.The right ventricular size is normal. No increase in right ventricular wall thickness.  3. Left atrial size was normal.  4. Right atrial size was normal.  5. The mitral valve is normal in structure. No evidence of mitral valve regurgitation. No evidence of mitral stenosis.  6. The tricuspid valve is normal in structure. Tricuspid valve regurgitation is mild.  7. The aortic valve is tricuspid Aortic valve regurgitation was not visualized by color flow Doppler. Structurally normal aortic valve, with no evidence of sclerosis or stenosis.  8. The pulmonic valve was normal in structure. Pulmonic valve regurgitation is not visualized by color flow Doppler.  9. Normal pulmonary artery systolic pressure. 10. The inferior vena cava is normal in size with greater than 50% respiratory variability, suggesting right atrial pressure of 3 mmHg.  Past Medical History:  Diagnosis Date  . Acute pancreatitis 03/16/2015  . Anemia   . Anxiety   . Back pain   . Cellulitis 09/2018   abdomen   . Constipation by delayed colonic transit   . Fatigue 10/27/2018  . GERD  (gastroesophageal reflux disease)   . Heart valve regurgitation    trivial PR, MR, TR 2014 echo [facility])  . 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 Ileus (HCC)   . Interstitial cystitis   . Irritable bowel syndrome with constipation   . Lumbar herniated disc 04/17/2017   L4/L5. Ruptured.  . OSA (obstructive sleep apnea) 03/16/2015  . Palpitations 10/27/2018  . Pancreatitis   . 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 Right leg weakness    and numbness   . Shortness of breath 10/27/2018  . Tachycardia     Past Surgical History:  Procedure Laterality Date  . ABDOMINAL EXPOSURE N/A 10/02/2018   Procedure: ABDOMINAL EXPOSURE;  Surgeon: Larina Earthly, MD;  Location: [facility] OR;  Service: Vascular;  Laterality: N/A;  . ANTERIOR LUMBAR FUSION N/A 10/02/2018   Procedure: Lumbar four-five Anterior lumbar interbody;  Surgeon: Barnett Abu, MD;  Location: [facility] OR;  Service: Neurosurgery;  Laterality: N/A;  . APPENDECTOMY    . BARIATRIC SURGERY    . CESAREAN SECTION    . CHOLECYSTECTOMY    . ESOPHAGOGASTRODUODENOSCOPY (EGD) WITH PROPOFOL N/A 03/18/2015   Procedure: ESOPHAGOGASTRODUODENOSCOPY (EGD) WITH PROPOFOL;  Surgeon: Sherrilyn Rist, MD;  Location: [facility] ENDOSCOPY;  Service: Endoscopy;  Laterality: N/A;  . GASTRECTOMY     laparoscopic sleeve gastrectomy 01/27/15  .  Leg Pain    . LUMBAR DISC SURGERY     L4-5 discectomy 05/2017  . OVARIAN CYST REMOVAL     Ruptured    Current Medications: Current Meds  Medication Sig  . acetaminophen (TYLENOL) 325 MG tablet Take 2 tablets (650 mg total) by mouth every 6 (six) hours as needed for mild pain or moderate pain (or Fever >/= 101).  . Cholecalciferol (VITAMIN D) 50 MCG (2000 UT) CAPS Take 1 capsule by mouth daily.  . clobetasol cream (TEMOVATE) 0.05 % Apply topically 3 (three) times daily. To affected skin in the left groin and thigh area.  . cyanocobalamin (,VITAMIN B-12,) 1000 MCG/ML injection Inject  1,000 mcg into the muscle every 14 (fourteen) days.  . diphenhydrAMINE (BENADRYL) 50 MG tablet Take 0.5 tablets (25 mg total) by mouth every 8 (eight) hours as needed for itching.  Marland Kitchen HYDROcodone-acetaminophen (NORCO/VICODIN) 5-325 MG tablet Take 1 tablet by mouth every 6 (six) hours as needed for severe pain.  . methocarbamol (ROBAXIN) 500 MG tablet Take 1 tablet (500 mg total) by mouth every 6 (six) hours as needed for muscle spasms.  . pantoprazole (PROTONIX) 40 MG tablet Take 1 tablet (40 mg total) by mouth daily.  . Vitamin D, Ergocalciferol, (DRISDOL) 1.25 MG (50000 UT) CAPS capsule 1 CAPSULE ONCE A WEEK FOR 12 WEEKS ORALLY     Allergies:   Reglan [metoclopramide] and Morphine and related   Social History   Socioeconomic History  . Marital status: Single    Spouse name: Not on file  . Number of children: 2  . Years of education: Grad   . Highest education level: Not on file  Occupational History  . Not on file  Social Needs  . Financial resource strain: Not on file  . Food insecurity    Worry: Not on file    Inability: Not on file  . Transportation needs    Medical: Not on file    Non-medical: Not on file  Tobacco Use  . Smoking status: Never Smoker  . Smokeless tobacco: Never Used  Substance and Sexual Activity  . Alcohol use: No    Alcohol/week: 0.0 standard drinks    Frequency: Never  . Drug use: No  . Sexual activity: Not on file  Lifestyle  . Physical activity    Days per week: Not on file    Minutes per session: Not on file  . Stress: Not on file  Relationships  . Social Herbalist on phone: Not on file    Gets together: Not on file    Attends religious service: Not on file    Active member of club or organization: Not on file    Attends meetings of clubs or organizations: Not on file    Relationship status: Not on file  Other Topics Concern  . Not on file  Social History Narrative   Drinks about 1-2 cans of soda a day      Family History:  The patient's family history includes Breast cancer in her maternal grandmother and paternal grandmother; Diabetes in her father and paternal grandmother; Hypertension in her father. ROS:   Please see the history of present illness.    All other systems reviewed and are negative.  EKGs/Labs/Other Studies Reviewed:    The following studies were reviewed today:   Recent Labs: 10/13/2018: Magnesium 2.0 10/27/2018: NT-Pro BNP 28 11/02/2018: ALT 12; BUN 10; Creatinine, Ser 0.66; Hemoglobin 11.3; Platelets 229; Potassium 3.9; Sodium 138  Recent Lipid Panel    Component Value Date/Time   TRIG 86 03/16/2015 1424    Physical Exam:    VS:  BP 120/78 (BP Location: Right Arm, Patient Position: Sitting, Cuff Size: Normal)   Pulse 84   Ht 5\' 2"  (1.575 m)   Wt 153 lb 6.4 oz (69.6 kg)   SpO2 100%   BMI 28.06 kg/m     Wt Readings from Last 3 Encounters:  11/17/18 153 lb 6.4 oz (69.6 kg)  11/02/18 150 lb (68 kg)  10/27/18 151 lb 6.4 oz (68.7 kg)     GEN:  Well nourished, well developed in no acute distress HEENT: Normal NECK: No JVD; No carotid bruits LYMPHATICS: No lymphadenopathy CARDIAC: RRR, no murmurs, rubs, gallops RESPIRATORY:  Clear to auscultation without rales, wheezing or rhonchi  ABDOMEN: Soft, non-tender, non-distended MUSCULOSKELETAL:  No edema; No deformity  SKIN: Warm and dry NEUROLOGIC:  Alert and oriented x 3 PSYCHIATRIC:  Normal affect    Signed, 10/29/18, MD  11/17/2018 4:59 PM    [facility]

## 2018-11-17 ENCOUNTER — Ambulatory Visit (INDEPENDENT_AMBULATORY_CARE_PROVIDER_SITE_OTHER): Payer: BC Managed Care – PPO | Admitting: Cardiology

## 2018-11-17 ENCOUNTER — Other Ambulatory Visit: Payer: Self-pay

## 2018-11-17 ENCOUNTER — Encounter: Payer: Self-pay | Admitting: Cardiology

## 2018-11-17 VITALS — BP 120/78 | HR 84 | Ht 62.0 in | Wt 153.4 lb

## 2018-11-17 DIAGNOSIS — R0602 Shortness of breath: Secondary | ICD-10-CM

## 2018-11-17 DIAGNOSIS — R002 Palpitations: Secondary | ICD-10-CM

## 2018-11-17 NOTE — Patient Instructions (Signed)
Medication Instructions:  Your physician recommends that you continue on your current medications as directed. Please refer to the Current Medication list given to you today.  *If you need a refill on your cardiac medications before your next appointment, please call your pharmacy*  Lab Work: None  If you have labs (blood work) drawn today and your tests are completely normal, you will receive your results only by: . MyChart Message (if you have MyChart) OR . A paper copy in the mail If you have any lab test that is abnormal or we need to change your treatment, we will call you to review the results.  Testing/Procedures: None  Follow-Up: At [facility], you and your health needs are our priority.  As part of our continuing mission to provide you with exceptional heart care, we have created designated Provider Care Teams.  These Care Teams include your primary Cardiologist (physician) and Advanced Practice Providers (APPs -  Physician Assistants and Nurse Practitioners) who all work together to provide you with the care you need, when you need it.  Your next appointment:   as needed if symptoms worsen or fail to improve  The format for your next appointment:   In Person  Provider:   Brian Munley, MD   

## 2018-11-18 ENCOUNTER — Ambulatory Visit: Payer: BC Managed Care – PPO | Admitting: Cardiology

## 2018-12-02 ENCOUNTER — Other Ambulatory Visit: Payer: Self-pay | Admitting: Gastroenterology

## 2018-12-02 DIAGNOSIS — Z8719 Personal history of other diseases of the digestive system: Secondary | ICD-10-CM

## 2018-12-02 DIAGNOSIS — R748 Abnormal levels of other serum enzymes: Secondary | ICD-10-CM

## 2018-12-02 DIAGNOSIS — R1010 Upper abdominal pain, unspecified: Secondary | ICD-10-CM

## 2018-12-23 ENCOUNTER — Other Ambulatory Visit: Payer: BC Managed Care – PPO

## 2019-01-07 ENCOUNTER — Other Ambulatory Visit: Payer: Self-pay

## 2019-01-07 ENCOUNTER — Ambulatory Visit
Admission: RE | Admit: 2019-01-07 | Discharge: 2019-01-07 | Disposition: A | Discharge status: Home / Self Care | Payer: BC Managed Care – PPO | Source: Ambulatory Visit | Attending: Gastroenterology | Admitting: Gastroenterology

## 2019-01-07 DIAGNOSIS — Z8719 Personal history of other diseases of the digestive system: Secondary | ICD-10-CM

## 2019-01-07 DIAGNOSIS — R748 Abnormal levels of other serum enzymes: Secondary | ICD-10-CM

## 2019-01-07 DIAGNOSIS — R1010 Upper abdominal pain, unspecified: Secondary | ICD-10-CM

## 2019-01-07 MED ORDER — GADOBENATE DIMEGLUMINE 529 MG/ML IV SOLN
14.0000 mL | Freq: Once | INTRAVENOUS | Status: AC | PRN
  Administered 2019-01-07: 14 mL via INTRAVENOUS

## 2019-03-26 ENCOUNTER — Ambulatory Visit: Payer: BC Managed Care – PPO | Attending: Family

## 2019-03-26 DIAGNOSIS — Z23 Encounter for immunization: Secondary | ICD-10-CM

## 2019-03-26 NOTE — Progress Notes (Signed)
   Covid-19 Vaccination Clinic  Name:  Laura Shah    MRN: [id number] DOB: 1980-07-28  03/26/2019  Laura Shah was observed post Covid-19 immunization for 15 minutes without incidence. She was provided with Vaccine Information Sheet and instruction to access the V-Safe system.   Laura Shah was instructed to call 911 with any severe reactions post vaccine: Marland Kitchen Difficulty breathing  . Swelling of your face and throat  . A fast heartbeat  . A bad rash all over your body  . Dizziness and weakness    Immunizations Administered    Name Date Dose VIS Date Route   Moderna COVID-19 Vaccine 03/26/2019  4:23 PM 0.5 mL 12/30/2018 Intramuscular   Manufacturer: Moderna   Lot: [id number]   NDC: 37366-815-94

## 2019-04-28 ENCOUNTER — Ambulatory Visit: Payer: BC Managed Care – PPO | Attending: Family

## 2019-04-28 DIAGNOSIS — Z23 Encounter for immunization: Secondary | ICD-10-CM

## 2019-04-28 NOTE — Progress Notes (Signed)
   Covid-19 Vaccination Clinic  Name:  DONNI OGLESBY    MRN: [id number] DOB: 1980-12-31  04/28/2019  Ms. Shipman was observed post Covid-19 immunization for 15 minutes without incident. She was provided with Vaccine Information Sheet and instruction to access the V-Safe system.   Ms. Griffitts was instructed to call 911 with any severe reactions post vaccine: Marland Kitchen Difficulty breathing  . Swelling of face and throat  . A fast heartbeat  . A bad rash all over body  . Dizziness and weakness   Immunizations Administered    Name Date Dose VIS Date Route   Moderna COVID-19 Vaccine 04/28/2019  9:58 AM 0.5 mL 12/30/2018 Intramuscular   Manufacturer: Moderna   Lot: [id number]   NDC: 84665-993-57

## 2019-05-16 ENCOUNTER — Other Ambulatory Visit: Payer: Self-pay

## 2019-05-16 ENCOUNTER — Emergency Department (HOSPITAL_BASED_OUTPATIENT_CLINIC_OR_DEPARTMENT_OTHER)
Admission: EM | Admit: 2019-05-16 | Discharge: 2019-05-16 | Disposition: A | Discharge status: Home / Self Care | Payer: BC Managed Care – PPO | Attending: Emergency Medicine | Admitting: Emergency Medicine

## 2019-05-16 ENCOUNTER — Encounter (HOSPITAL_BASED_OUTPATIENT_CLINIC_OR_DEPARTMENT_OTHER): Payer: Self-pay | Admitting: Emergency Medicine

## 2019-05-16 ENCOUNTER — Emergency Department (HOSPITAL_BASED_OUTPATIENT_CLINIC_OR_DEPARTMENT_OTHER): Payer: BC Managed Care – PPO

## 2019-05-16 DIAGNOSIS — Y999 Unspecified external cause status: Secondary | ICD-10-CM | POA: Insufficient documentation

## 2019-05-16 DIAGNOSIS — Z888 Allergy status to other drugs, medicaments and biological substances status: Secondary | ICD-10-CM | POA: Insufficient documentation

## 2019-05-16 DIAGNOSIS — Z79899 Other long term (current) drug therapy: Secondary | ICD-10-CM | POA: Diagnosis not present

## 2019-05-16 DIAGNOSIS — Y9389 Activity, other specified: Secondary | ICD-10-CM | POA: Insufficient documentation

## 2019-05-16 DIAGNOSIS — S62521A Displaced fracture of distal phalanx of right thumb, initial encounter for closed fracture: Secondary | ICD-10-CM | POA: Diagnosis not present

## 2019-05-16 DIAGNOSIS — Y92810 Car as the place of occurrence of the external cause: Secondary | ICD-10-CM | POA: Diagnosis not present

## 2019-05-16 DIAGNOSIS — W231XXA Caught, crushed, jammed, or pinched between stationary objects, initial encounter: Secondary | ICD-10-CM | POA: Insufficient documentation

## 2019-05-16 DIAGNOSIS — Z885 Allergy status to narcotic agent status: Secondary | ICD-10-CM | POA: Diagnosis not present

## 2019-05-16 DIAGNOSIS — S6991XA Unspecified injury of right wrist, hand and finger(s), initial encounter: Secondary | ICD-10-CM | POA: Diagnosis present

## 2019-05-16 NOTE — Discharge Instructions (Signed)
Schedule to see the Hand surgeon for recheck in 1 week

## 2019-05-16 NOTE — ED Provider Notes (Signed)
[facility] EMERGENCY DEPARTMENT Provider Note   CSN: [id number] Arrival date & time: 05/16/19  1015     History Chief Complaint  Patient presents with   Finger Injury    Laura Shah is a 39 y.o. female.  The history is provided by the patient. No language interpreter was used.  Hand Pain This is a new problem. The current episode started yesterday. The problem occurs constantly. The problem has not changed since onset.Nothing aggravates the symptoms. Nothing relieves the symptoms. She has tried nothing for the symptoms. The treatment provided no relief.  Pt reports she closed her finger ina car door yesterday.  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 Ruptured.   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   Constipation by delayed colonic transit    Acute pancreatitis 03/16/2015   OSA  (obstructive sleep apnea) 03/16/2015   Anemia 03/16/2015   Hypokalemia 03/16/2015   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   Epigastric abdominal pain    RUQ pain    Elevated LFTs    Irritable bowel syndrome with constipation    Injury of hand, right 08/03/2014   Interstitial cystitis    Tachycardia     Past Surgical History:  Procedure Laterality Date   ABDOMINAL EXPOSURE N/A 10/02/2018   Procedure: ABDOMINAL EXPOSURE;  Surgeon: Rosetta Posner, MD;  Location: [facility] OR;  Service: Vascular;  Laterality: N/A;   ANTERIOR LUMBAR FUSION N/A 10/02/2018   Procedure: Lumbar four-five Anterior lumbar interbody;  Surgeon: Kristeen Miss, MD;  Location: [facility];  Service: Neurosurgery;  Laterality: N/A;   APPENDECTOMY     BARIATRIC SURGERY     CESAREAN SECTION     CHOLECYSTECTOMY     ESOPHAGOGASTRODUODENOSCOPY (EGD) WITH PROPOFOL N/A 03/18/2015   Procedure: ESOPHAGOGASTRODUODENOSCOPY (EGD) WITH PROPOFOL;  Surgeon: Doran Stabler, MD;  Location: [place];  Service: Endoscopy;  Laterality: N/A;   GASTRECTOMY     laparoscopic sleeve gastrectomy 01/27/15   Leg Pain     LUMBAR DISC SURGERY     L4-5 discectomy 05/2017   OVARIAN CYST REMOVAL     Ruptured     OB History   No obstetric history on file.     Family History  Problem Relation Age of  Onset   Breast cancer Maternal Grandmother    Breast cancer Paternal Grandmother    Diabetes Paternal Grandmother    Diabetes Father    Hypertension Father     Social History   Tobacco Use   Smoking status: Never Smoker   Smokeless tobacco: Never Used  Substance Use Topics   Alcohol use: No    Alcohol/week: 0.0 standard drinks   Drug use: No    Home Medications Prior to Admission medications   Medication Sig Start Date End Date Taking? Authorizing Provider  acetaminophen (TYLENOL) 325 MG tablet Take 2 tablets (650 mg total) by mouth every 6 (six) hours as needed for mild pain or moderate pain (or Fever  >/= 101). 10/13/18   Leroy Sea, MD  Cholecalciferol (VITAMIN D) 50 MCG (2000 UT) CAPS Take 1 capsule by mouth daily.    [provider]  clobetasol cream (TEMOVATE) 0.05 % Apply topically 3 (three) times daily. To affected skin in the left groin and thigh area. 10/13/18   Leroy Sea, MD  cyanocobalamin (,VITAMIN B-12,) 1000 MCG/ML injection Inject 1,000 mcg into the muscle every 14 (fourteen) days.    [provider]  diphenhydrAMINE (BENADRYL) 50 MG tablet Take 0.5 tablets (25 mg total) by mouth every 8 (eight) hours as needed for itching. 10/13/18   Leroy Sea, MD  HYDROcodone-acetaminophen (NORCO/VICODIN) 5-325 MG tablet Take 1 tablet by mouth every 6 (six) hours as needed for severe pain. 10/13/18   Leroy Sea, MD  methocarbamol (ROBAXIN) 500 MG tablet Take 1 tablet (500 mg total) by mouth every 6 (six) hours as needed for muscle spasms. 10/13/18   Leroy Sea, MD  pantoprazole (PROTONIX) 40 MG tablet Take 1 tablet (40 mg total) by mouth daily. 08/28/14   Dione Booze, MD  Vitamin D, Ergocalciferol, (DRISDOL) 1.25 MG (50000 UT) CAPS capsule 1 CAPSULE ONCE A WEEK FOR 12 WEEKS ORALLY 10/15/18   [provider]    Allergies    Reglan [metoclopramide] and Morphine and related  Review of Systems   Review of Systems  All other systems reviewed and are negative.   Physical Exam Updated Vital Signs BP (!) 142/97 (BP Location: Left Arm)    Pulse 89    Temp 98 F (36.7 C) (Oral)    Resp 16    Ht 5\' 2"  (1.575 m)    Wt 68 kg    LMP 05/13/2019    SpO2 100%    BMI 27.44 kg/m   Physical Exam Vitals and nursing note reviewed.  Constitutional:      Appearance: She is well-developed.  HENT:     Head: Normocephalic.  Cardiovascular:     Rate and Rhythm: Normal rate.  Pulmonary:     Effort: Pulmonary effort is normal.  Abdominal:     General: There is no distension.  Musculoskeletal:        General: Swelling and tenderness present.   Skin:    General: Skin is warm.  Neurological:     General: No focal deficit present.     Mental Status: She is alert and oriented to person, place, and time.  Psychiatric:        Mood and Affect: Mood normal.     ED Results / Procedures / Treatments   Labs (all labs ordered are listed, but only abnormal results are displayed) Labs Reviewed - No data to display  EKG None  Radiology DG Finger Thumb Right  Result Date: 05/16/2019  CLINICAL DATA:  39 year old female with trauma EXAM: RIGHT THUMB 2+V COMPARISON:  None. FINDINGS: Dorsal cortex of the distal phalanx of the right thumb is disrupted, with fracture line extending to the joint space. Associated soft tissue swelling. No additional fracture. No dislocation. IMPRESSION: Non displaced fracture of the distal phalanx of the right thumb. Electronically Signed   By: Gilmer Mor D.O.   On: 05/16/2019 10:47    Procedures Procedures (including critical care time)  Medications Ordered in ED Medications - No data to display  ED Course  I have reviewed the triage vital signs and the nursing notes.  Pertinent labs & imaging results that were available during my care of the patient were reviewed by me and considered in my medical decision making (see chart for details).    MDM Rules/Calculators/A&P                      MDM: xray shows fracture through distal phalanx.   Pt placed in a splint.  She is advised to follow up with Dr. Louanna Raw surgeon for evaluation  Final Clinical Impression(s) / ED Diagnoses Final diagnoses:  Closed displaced fracture of distal phalanx of right thumb, initial encounter    Rx / DC Orders ED Discharge Orders    None    An After Visit Summary was printed and given to the patient.    Elson Areas, [place] 05/16/19 1117    Virgina Norfolk, DO 05/16/19 1124

## 2019-05-16 NOTE — ED Triage Notes (Signed)
She closed her R thumb in car door yesterday.

## 2019-06-10 ENCOUNTER — Other Ambulatory Visit: Payer: Self-pay | Admitting: Obstetrics & Gynecology

## 2019-06-10 DIAGNOSIS — N644 Mastodynia: Secondary | ICD-10-CM

## 2019-06-24 ENCOUNTER — Other Ambulatory Visit: Payer: BC Managed Care – PPO

## 2019-07-03 ENCOUNTER — Ambulatory Visit
Admission: RE | Admit: 2019-07-03 | Discharge: 2019-07-03 | Disposition: A | Discharge status: Home / Self Care | Payer: BC Managed Care – PPO | Source: Ambulatory Visit | Attending: Obstetrics & Gynecology | Admitting: Obstetrics & Gynecology

## 2019-07-03 ENCOUNTER — Other Ambulatory Visit: Payer: Self-pay

## 2019-07-03 DIAGNOSIS — N644 Mastodynia: Secondary | ICD-10-CM

## 2019-09-17 ENCOUNTER — Ambulatory Visit: Payer: Self-pay | Admitting: Surgery

## 2019-09-17 NOTE — H&P (Signed)
CC: Self referred for recurring drainage in gluteal cleft  HPI: Laura Shah is a very pleasant 39yoF with hx of intermittent drainage from the gluteal cleft that dates back proximally 6 years. She states that the natural course of this is one where she will have pain and swelling followed by drainage and will have every couple of weeks to every couple of months. She reports 1 prior procedure for this or something was lanced. Back in 2015.  PMH: GERD following sleeve gastrectomy-takes pantoprazole daily. Chronic constipation - following with gastroenterology, Laura Shah, Laura Shah - has tried Linzess without a lot of help  PSH: Sleeve gastrectomy, 2016- [place] - reports "blood clot in her liver" that she is placed on anticoagulation for but is no longer on this treatment Laparoscopic cholecystectomy Open appendectomy as a child Ovarian cystectomy  FHx: Denies FHx of colorectal, breast, endometrial, ovarian or cervical cancer  Social: Denies use of tobacco/EtOH/drugs. She is working as a Runner, broadcasting/film/video in [employer]  ROS: A comprehensive 10 system review of systems was completed with the patient and pertinent findings as noted above.  The patient is a 39 year old female.   Allergies (Laura Shah; 09/02/2019 4:19 PM) Reglan *GASTROINTESTINAL AGENTS - MISC.*  Morphine Sulfate (Concentrate) *ANALGESICS - OPIOID*  Allergies Reconciled   Medication History (Laura Shah; 09/02/2019 4:20 PM) Pantoprazole Sodium (40MG  Tablet DR, Oral) Active. Medications Reconciled    Review of Systems M. Laura Dicostanzo Shah; 09/02/2019 4:37 PM) General Not Present- Appetite Loss, Chills, Fatigue, Fever, Night Sweats, Weight Gain and Weight Loss. Skin Not Present- Change in Wart/Mole, Dryness, Hives, Jaundice, New Lesions, Non-Healing Wounds, Rash and Ulcer. HEENT Present- Ringing in the Ears and Wears glasses/contact lenses. Not Present- Earache, Hearing Loss, Hoarseness, Nose Bleed,  Oral Ulcers, Seasonal Allergies, Sinus Pain, Sore Throat, Visual Disturbances and Yellow Eyes. Respiratory Not Present- Bloody sputum, Chronic Cough, Difficulty Breathing, Snoring and Wheezing. Breast Not Present- Breast Mass, Breast Pain, Nipple Discharge and Skin Changes. Cardiovascular Present- Leg Cramps. Not Present- Chest Pain, Difficulty Breathing Lying Down, Palpitations, Rapid Heart Rate, Shortness of Breath and Swelling of Extremities. Gastrointestinal Present- Bloating, Constipation, Excessive gas and Rectal Pain. Not Present- Abdominal Pain, Bloody Stool, Change in Bowel Habits, Chronic diarrhea, Difficulty Swallowing, Gets full quickly at meals, Hemorrhoids, Indigestion, Nausea and Vomiting. Female Genitourinary Present- Frequency. Not Present- Nocturia, Painful Urination, Pelvic Pain and Urgency. Musculoskeletal Present- Back Pain, Joint Stiffness and Muscle Weakness. Not Present- Joint Pain, Muscle Pain and Swelling of Extremities. Neurological Present- Numbness, Tingling, Trouble walking and Weakness. Not Present- Decreased Memory, Fainting, Headaches, Seizures and Tremor. Psychiatric Present- Anxiety. Not Present- Bipolar, Change in Sleep Pattern, Depression, Fearful and Frequent crying. Endocrine Present- Cold Intolerance. Not Present- Excessive Hunger, Hair Changes, Heat Intolerance, Hot flashes and New Diabetes. Hematology Not Present- Blood Thinners, Easy Bruising, Excessive bleeding, Gland problems, HIV and Persistent Infections.  Vitals (Laura Shah; 09/02/2019 4:20 PM) 09/02/2019 4:20 PM Weight: 165.13 lb Height: 62in Body Surface Area: 1.76 m Body Mass Index: 30.2 kg/m  Temp.: 27F  Pulse: 101 (Regular)        Physical Exam 11/02/2019 Laura Shah; 09/02/2019 4:40 PM) The physical exam findings are as follows: Note: Constitutional: No acute distress; conversant; no deformities; wearing mask Eyes: Moist conjunctiva; no lid lag; anicteric sclerae;  pupils equal and round Neck: Trachea midline; no palpable thyromegaly Lungs: Normal respiratory effort; no tactile fremitus CV: rrr; no palpable thrill; no pitting edema GI: Abdomen soft, nontender, nondistended; no  palpable hepatosplenomegaly Anorectal: chronic 5 mm wound posterior midline in gluteal cleft - tract probes cephlad and not towards anus. 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 The pathophysiology of pilonidal disease and anal abscess/fistual was discussed at length with associated pictures and illustrations. -We discussed options going forward. She is interested in surgery to address this as it has been giving her ongoing issues drainage. We discussed that given its location and how it does appear to tract cephalad, this most likely being bilateral disease. We discussed however that this could represent an anal fistula. We discussed anorectal exam under anesthesia, possible excision of pilonidal disease, possible fistulotomy, possible seton placement. -The planned procedures, material risks (including, but not limited to, pain, bleeding, infection, scarring, damage to anal sphincter, incontinence of gas and/or stool, need for additional procedures,  recurrence of her current condition, pneumonia, heart attack, stroke, death) benefits and alternatives to surgery were discussed at length. We discussed recurrence rates of both disease and fistulous as well.. The patient's questions were answered to their satisfaction, they voiced understanding and elected to proceed with surgery. Additionally, we discussed typical postoperative expectations and the recovery process - including potential for large wound burden with condyle disease excision and wound care expectations for often a couple of months following surgery with wet-to-dry dressings

## 2019-11-09 ENCOUNTER — Other Ambulatory Visit (HOSPITAL_COMMUNITY): Payer: BC Managed Care – PPO

## 2019-11-11 NOTE — Progress Notes (Signed)
Called [facility] and  spoke to wendy, pt did not take scheduled covid test 11-09-2019 and has not returned phone calls for 11-12-2019 surgery

## 2019-11-12 ENCOUNTER — Encounter (HOSPITAL_BASED_OUTPATIENT_CLINIC_OR_DEPARTMENT_OTHER): Admission: RE | Payer: Self-pay | Source: Home / Self Care

## 2019-11-12 ENCOUNTER — Ambulatory Visit (HOSPITAL_BASED_OUTPATIENT_CLINIC_OR_DEPARTMENT_OTHER): Admission: RE | Admit: 2019-11-12 | Payer: BC Managed Care – PPO | Source: Home / Self Care | Admitting: Surgery

## 2019-11-12 SURGERY — EXAM UNDER ANESTHESIA, RECTUM
Anesthesia: General

## 2020-01-18 ENCOUNTER — Emergency Department (HOSPITAL_BASED_OUTPATIENT_CLINIC_OR_DEPARTMENT_OTHER): Payer: BC Managed Care – PPO

## 2020-01-18 ENCOUNTER — Encounter (HOSPITAL_BASED_OUTPATIENT_CLINIC_OR_DEPARTMENT_OTHER): Payer: Self-pay | Admitting: Emergency Medicine

## 2020-01-18 ENCOUNTER — Other Ambulatory Visit: Payer: Self-pay

## 2020-01-18 ENCOUNTER — Emergency Department (HOSPITAL_BASED_OUTPATIENT_CLINIC_OR_DEPARTMENT_OTHER)
Admission: EM | Admit: 2020-01-18 | Discharge: 2020-01-18 | Disposition: A | Discharge status: Home / Self Care | Payer: BC Managed Care – PPO | Attending: Emergency Medicine | Admitting: Emergency Medicine

## 2020-01-18 DIAGNOSIS — K219 Gastro-esophageal reflux disease without esophagitis: Secondary | ICD-10-CM | POA: Insufficient documentation

## 2020-01-18 DIAGNOSIS — R1010 Upper abdominal pain, unspecified: Secondary | ICD-10-CM | POA: Diagnosis not present

## 2020-01-18 DIAGNOSIS — R059 Cough, unspecified: Secondary | ICD-10-CM | POA: Insufficient documentation

## 2020-01-18 DIAGNOSIS — R072 Precordial pain: Secondary | ICD-10-CM | POA: Insufficient documentation

## 2020-01-18 DIAGNOSIS — R0602 Shortness of breath: Secondary | ICD-10-CM | POA: Diagnosis not present

## 2020-01-18 DIAGNOSIS — R079 Chest pain, unspecified: Secondary | ICD-10-CM

## 2020-01-18 DIAGNOSIS — M549 Dorsalgia, unspecified: Secondary | ICD-10-CM | POA: Insufficient documentation

## 2020-01-18 DIAGNOSIS — Z20822 Contact with and (suspected) exposure to covid-19: Secondary | ICD-10-CM | POA: Diagnosis not present

## 2020-01-18 LAB — CBC WITH DIFFERENTIAL/PLATELET
Abs Immature Granulocytes: 0 10*3/uL (ref 0.00–0.07)
Basophils Absolute: 0 10*3/uL (ref 0.0–0.1)
Basophils Relative: 1 %
Eosinophils Absolute: 0.1 10*3/uL (ref 0.0–0.5)
Eosinophils Relative: 3 %
HCT: 38.9 % (ref 36.0–46.0)
Hemoglobin: 12.3 g/dL (ref 12.0–15.0)
Immature Granulocytes: 0 %
Lymphocytes Relative: 32 %
Lymphs Abs: 1.6 10*3/uL (ref 0.7–4.0)
MCH: 26 pg (ref 26.0–34.0)
MCHC: 31.6 g/dL (ref 30.0–36.0)
MCV: 82.2 fL (ref 80.0–100.0)
Monocytes Absolute: 0.5 10*3/uL (ref 0.1–1.0)
Monocytes Relative: 10 %
Neutro Abs: 2.8 10*3/uL (ref 1.7–7.7)
Neutrophils Relative %: 54 %
Platelets: 302 10*3/uL (ref 150–400)
RBC: 4.73 MIL/uL (ref 3.87–5.11)
RDW: 14.5 % (ref 11.5–15.5)
WBC: 5 10*3/uL (ref 4.0–10.5)
nRBC: 0 % (ref 0.0–0.2)

## 2020-01-18 LAB — TROPONIN I (HIGH SENSITIVITY)
Troponin I (High Sensitivity): 3 ng/L (ref <18)
Troponin I (High Sensitivity): 3 ng/L (ref <18)

## 2020-01-18 LAB — COMPREHENSIVE METABOLIC PANEL
ALT: 12 U/L (ref 0–44)
AST: 18 U/L (ref 15–41)
Albumin: 3.8 g/dL (ref 3.5–5.0)
Alkaline Phosphatase: 87 U/L (ref 38–126)
Anion gap: 10 (ref 5–15)
BUN: 12 mg/dL (ref 6–20)
CO2: 23 mmol/L (ref 22–32)
Calcium: 9.3 mg/dL (ref 8.9–10.3)
Chloride: 106 mmol/L (ref 98–111)
Creatinine, Ser: 0.82 mg/dL (ref 0.44–1.00)
GFR, Estimated: >60 mL/min (ref >60)
Glucose, Bld: 104 mg/dL — ABNORMAL HIGH (ref 70–99)
Potassium: 3.5 mmol/L (ref 3.5–5.1)
Sodium: 139 mmol/L (ref 135–145)
Total Bilirubin: 0.6 mg/dL (ref 0.3–1.2)
Total Protein: 7.5 g/dL (ref 6.5–8.1)

## 2020-01-18 LAB — D-DIMER, QUANTITATIVE: D-Dimer, Quant: 0.41 ug/mL-FEU (ref 0.00–0.50)

## 2020-01-18 LAB — LIPASE, BLOOD: Lipase: 48 U/L (ref 11–51)

## 2020-01-18 LAB — RESP PANEL BY RT-PCR (FLU A&B, COVID) ARPGX2
Influenza A by PCR: NEGATIVE
Influenza B by PCR: NEGATIVE
SARS Coronavirus 2 by RT PCR: NEGATIVE

## 2020-01-18 MED ORDER — SUCRALFATE 1 GM/10ML PO SUSP
1.0000 g | Freq: Three times a day (TID) | ORAL | Status: DC
  Administered 2020-01-18: 1 g via ORAL
  Filled 2020-01-18: qty 10

## 2020-01-18 MED ORDER — ALUM & MAG HYDROXIDE-SIMETH 200-200-20 MG/5ML PO SUSP
30.0000 mL | Freq: Once | ORAL | Status: AC
  Administered 2020-01-18: 30 mL via ORAL
  Filled 2020-01-18: qty 30

## 2020-01-18 MED ORDER — LIDOCAINE VISCOUS HCL 2 % MT SOLN
15.0000 mL | Freq: Once | OROMUCOSAL | Status: AC
  Administered 2020-01-18: 15 mL via ORAL
  Filled 2020-01-18: qty 15

## 2020-01-18 MED ORDER — ASPIRIN EC 81 MG PO TBEC
324.0000 mg | DELAYED_RELEASE_TABLET | Freq: Once | ORAL | Status: AC
  Administered 2020-01-18: 324 mg via ORAL
  Filled 2020-01-18: qty 4

## 2020-01-18 MED ORDER — SUCRALFATE 1 G PO TABS
1.0000 g | ORAL_TABLET | Freq: Once | ORAL | Status: DC
  Filled 2020-01-18: qty 1

## 2020-01-18 NOTE — ED Notes (Signed)
EDP at bedside  

## 2020-01-18 NOTE — Discharge Instructions (Addendum)
You were seen in the emergency department for evaluation of chest pain.  You had blood work EKG and a chest x-ray that did not show an obvious explanation for your pain.  There was no evidence of any cardiac injury.  This may be related to acid reflux or peptic ulcer disease.  Please restart your acid medication.  Maalox in between meals and at bedtime.  Your Covid testing was pending at time of discharge and you can follow-up with out in MyChart.

## 2020-01-18 NOTE — ED Provider Notes (Signed)
[facility] EMERGENCY DEPARTMENT Provider Note   CSN: [id number] Arrival date & time: 01/18/20  [id number]     History Chief Complaint  Patient presents with  . Chest Pain    Laura Shah is a 39 y.o. female.  Laura Shah has a history of back problems and gastric bypass.  Laura Shah started with a dry cough yesterday and then experienced lower chest upper abdominal discomfort that radiates through to her back.  Moderate in severity.  Aching.  Worse with movement and deep breathing.  Worse with palpation.  Increased with lying down flat.  Feels short of breath.  No diaphoresis nausea vomiting.  Reports a hoarse voice.  No prior history of same.  No cardiac history.  No smoking or cocaine.  The history is provided by the patient.  Chest Pain Pain location:  Substernal area Pain quality: aching   Pain radiates to:  Mid back Pain severity:  Moderate Onset quality:  Gradual Duration:  20 hours Timing:  Constant Progression:  Unchanged Chronicity:  New Relieved by:  None tried Worsened by:  Deep breathing and movement Ineffective treatments:  None tried Associated symptoms: back pain, cough and shortness of breath   Associated symptoms: no abdominal pain, no diaphoresis, no fever, no headache, no heartburn, no lower extremity edema, no nausea and no vomiting   Risk factors: no high cholesterol, no hypertension and no smoking     HPI: A 39 year old patient presents for evaluation of chest pain. Initial onset of pain was more than 6 hours ago. The patient's chest pain is described as heaviness/pressure/tightness and is not worse with exertion. The patient's chest pain is middle- or left-sided, is not well-localized, is not sharp and does not radiate to the arms/jaw/neck. The patient does not complain of nausea and denies diaphoresis. The patient has no history of stroke, has no history of peripheral artery disease, has not smoked in the past 90 days, denies any history of treated diabetes, has  no relevant family history of coronary artery disease (first degree relative at less than age 41), is not hypertensive, has no history of hypercholesterolemia and does not have an elevated BMI (>=30).   Past Medical History:  Diagnosis Date  . Acute pancreatitis 03/16/2015  . Anemia   . Anxiety   . Back pain   . Cellulitis 09/2018   abdomen   . Constipation by delayed colonic transit   . Fatigue 10/27/2018  . GERD (gastroesophageal reflux disease)   . Heart valve regurgitation    trivial PR, MR, TR 2014 echo [facility]  . 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 Ileus (HCC)   . Interstitial cystitis   . Irritable bowel syndrome with constipation   . Lumbar herniated disc 04/17/2017   L4/L5. Ruptured.  . OSA (obstructive sleep apnea) 03/16/2015  . Palpitations 10/27/2018  . Pancreatitis   . 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 Right leg weakness    and numbness   . Shortness of breath 10/27/2018  . Tachycardia     Patient Active Problem List   Diagnosis Date Noted  . Palpitations 10/27/2018  . Fatigue 10/27/2018  . Shortness of breath 10/27/2018  . GERD (gastroesophageal reflux disease) 10/11/2018  . Cellulitis 10/10/2018  . Herniated nucleus pulposus, L4-5 right 10/02/2018  . Herniated nucleus pulposus, L4-5 10/02/2018  . Right leg weakness   . 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 Constipation by delayed colonic transit   .  Acute pancreatitis 03/16/2015  . OSA (obstructive sleep apnea) 03/16/2015  . Anemia 03/16/2015  . Hypokalemia 03/16/2015  . 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 Epigastric abdominal pain   . RUQ pain   . Elevated LFTs   . Irritable bowel syndrome with constipation   . Injury of hand, right 08/03/2014  . Interstitial cystitis   . Tachycardia     Past Surgical History:  Procedure Laterality Date  . ABDOMINAL EXPOSURE N/A 10/02/2018   Procedure: ABDOMINAL EXPOSURE;  Surgeon: Larina Earthly, MD;  Location: [facility] OR;  Service: Vascular;  Laterality: N/A;  . ANTERIOR LUMBAR FUSION N/A 10/02/2018   Procedure: Lumbar four-five Anterior lumbar interbody;  Surgeon: Barnett Abu, MD;  Location: [facility] OR;  Service: Neurosurgery;  Laterality: N/A;  . APPENDECTOMY    . BARIATRIC SURGERY    . CESAREAN SECTION    . CHOLECYSTECTOMY    . ESOPHAGOGASTRODUODENOSCOPY (EGD) WITH PROPOFOL N/A 03/18/2015   Procedure: ESOPHAGOGASTRODUODENOSCOPY (EGD) WITH PROPOFOL;  Surgeon: Sherrilyn Rist, MD;  Location: [facility] ENDOSCOPY;  Service: Endoscopy;  Laterality: N/A;  . GASTRECTOMY     laparoscopic sleeve gastrectomy 01/27/15  . Leg Pain    . LUMBAR DISC SURGERY     L4-5 discectomy 05/2017  . OVARIAN CYST REMOVAL     Ruptured     OB History   No obstetric history on file.     Family History  Problem Relation Age of Onset  . Breast cancer Maternal Grandmother   . Breast cancer Paternal Grandmother   . Diabetes Paternal Grandmother   . Diabetes Father   . Hypertension Father     Social History   Tobacco Use  . Smoking status: Never Smoker  . Smokeless tobacco: Never Used  Vaping Use  . Vaping Use: Never used  Substance Use Topics  . Alcohol use: No    Alcohol/week: 0.0 standard drinks  . Drug use: No    Home Medications Prior to Admission medications   Medication Sig Start Date End Date Taking? Authorizing Provider  acetaminophen (TYLENOL) 325 MG tablet Take 2 tablets (650 mg total) by mouth every 6 (six) hours as needed for mild pain or moderate pain (or Fever >/= 101). 10/13/18   Leroy Sea, MD  Cholecalciferol (VITAMIN D) 50 MCG (2000 UT) CAPS Take 1 capsule by mouth daily.    [provider]  clobetasol cream (TEMOVATE) 0.05 % Apply topically 3 (three) times daily. To affected skin in the left groin and thigh area. 10/13/18   Leroy Sea, MD  cyanocobalamin (,VITAMIN B-12,) 1000 MCG/ML injection Inject 1,000 mcg into the muscle every 14 (fourteen) days.     [provider]  diphenhydrAMINE (BENADRYL) 50 MG tablet Take 0.5 tablets (25 mg total) by mouth every 8 (eight) hours as needed for itching. 10/13/18   Leroy Sea, MD  HYDROcodone-acetaminophen (NORCO/VICODIN) 5-325 MG tablet Take 1 tablet by mouth every 6 (six) hours as needed for severe pain. 10/13/18   Leroy Sea, MD  methocarbamol (ROBAXIN) 500 MG tablet Take 1 tablet (500 mg total) by mouth every 6 (six) hours as needed for muscle spasms. 10/13/18   Leroy Sea, MD  pantoprazole (PROTONIX) 40 MG tablet Take 1 tablet (40 mg total) by mouth daily. 08/28/14   Dione Booze, MD  Vitamin D, Ergocalciferol, (DRISDOL) 1.25 MG (50000 UT) CAPS capsule 1 CAPSULE ONCE A WEEK FOR 12 WEEKS ORALLY 10/15/18   [provider]    Allergies  Reglan [metoclopramide] and Morphine and related  Review of Systems   Review of Systems  Constitutional: Negative for diaphoresis and fever.  HENT: Negative for sore throat.   Eyes: Negative for visual disturbance.  Respiratory: Positive for cough and shortness of breath.   Cardiovascular: Positive for chest pain.  Gastrointestinal: Negative for abdominal pain, heartburn, nausea and vomiting.  Genitourinary: Negative for dysuria.  Musculoskeletal: Positive for back pain.  Skin: Negative for rash.  Neurological: Negative for headaches.    Physical Exam Updated Vital Signs BP (!) 145/79 (BP Location: Right Arm)   Pulse 100   Temp 98.5 F (36.9 C)   Resp 16   Ht 5\' 2"  (1.575 m)   Wt 68 kg   SpO2 100%   BMI 27.44 kg/m   Physical Exam Vitals and nursing note reviewed.  Constitutional:      General: Laura Shah is not in acute distress.    Appearance: Laura Shah is well-developed and well-nourished.  HENT:     Head: Normocephalic and atraumatic.  Eyes:     Conjunctiva/sclera: Conjunctivae normal.  Cardiovascular:     Rate and Rhythm: Normal rate and regular rhythm.     Heart sounds: Normal heart sounds. No murmur  heard.   Pulmonary:     Effort: Pulmonary effort is normal. No respiratory distress.     Breath sounds: Normal breath sounds.  Abdominal:     Palpations: Abdomen is soft.     Tenderness: There is no abdominal tenderness.  Musculoskeletal:        General: No edema. Normal range of motion.     Cervical back: Neck supple.     Right lower leg: No tenderness. No edema.     Left lower leg: No tenderness. No edema.  Skin:    General: Skin is warm and dry.     Capillary Refill: Capillary refill takes less than 2 seconds.  Neurological:     General: No focal deficit present.     Mental Status: Laura Shah is alert.  Psychiatric:        Mood and Affect: Mood and affect normal.     ED Results / Procedures / Treatments   Labs (all labs ordered are listed, but only abnormal results are displayed) Labs Reviewed  COMPREHENSIVE METABOLIC PANEL - Abnormal; Notable for the following components:      Result Value   Glucose, Bld 104 (*)    All other components within normal limits  RESP PANEL BY RT-PCR (FLU A&B, COVID) ARPGX2  CBC WITH DIFFERENTIAL/PLATELET  D-DIMER, QUANTITATIVE (NOT AT [facility])  LIPASE, BLOOD  TROPONIN I (HIGH SENSITIVITY)  TROPONIN I (HIGH SENSITIVITY)    EKG EKG Interpretation  Date/Time:  Monday January 18 2020 07:01:20 EST Ventricular Rate:  100 PR Interval:    QRS Duration: 91 QT Interval:  358 QTC Calculation: 462 R Axis:   22 Text Interpretation: Sinus tachycardia No significant change since prior 12/21 Confirmed by 1/22 [phone]) on 01/18/2020 7:14:35 AM   Radiology DG Chest Port 1 View  Result Date: 01/18/2020 CLINICAL DATA:  Chest pain EXAM: PORTABLE CHEST 1 VIEW COMPARISON:  08/28/2014 FINDINGS: Normal heart size and mediastinal contours. No acute infiltrate or edema. No effusion or pneumothorax. No acute osseous findings. Artifact from EKG leads IMPRESSION: No active disease. Electronically Signed   By: 08/30/2014 M.D.   On: 01/18/2020 07:52     Procedures Procedures (including critical care time)  Medications Ordered in ED Medications  aspirin EC tablet 324 mg (324  mg Oral Given 01/18/20 0727)  alum & mag hydroxide-simeth (MAALOX/MYLANTA) 200-200-20 MG/5ML suspension 30 mL (30 mLs Oral Given 01/18/20 0727)    And  lidocaine (XYLOCAINE) 2 % viscous mouth solution 15 mL (15 mLs Oral Given 01/18/20 5852)    ED Course  I have reviewed the triage vital signs and the nursing notes.  Pertinent labs & imaging results that were available during my care of the patient were reviewed by me and considered in my medical decision making (see chart for details).  Clinical Course as of 01/18/20 1722  Mon Jan 18, 2020  [id number] Chest x-ray interpreted by me as no acute infiltrate or pneumothorax. [MB]  [id number] Reevaluated patient, still having the discomfort radiating through to her back.  Laura Shah said Laura Shah is had pancreatitis before lipase here is normal.  Laura Shah has had a cholecystectomy.  Laura Shah is asking for a Covid test.  We will try her with some Carafate to. [MB]    Clinical Course User Index [MB] Terrilee Files, MD   MDM Rules/Calculators/A&P HEAR Score: 1                        This patient complains of lower chest pain into the back; this involves an extensive number of treatment Options and is a complaint that carries with it a high risk of complications and Morbidity. The differential includes ACS, pneumonia, PE, pneumothorax, peptic ulcer disease, gastritis, musculoskeletal, biliary colic, pancreatitis I ordered, reviewed and interpreted labs, which included CBC with a normal white count normal hemoglobin, chemistries normal, mildly elevated glucose, normal LFTs, normal D-dimer, low troponin, normal lipase, Covid testing negative I ordered medication aspirin, GI cocktail Carafate I ordered imaging studies which included chest x-ray and I independently    visualized and interpreted imaging which showed no acute pulmonary disease Previous  records obtained and reviewed in epic, no recent admissions  After the interventions stated above, I reevaluated the patient and found patient still to have some discomfort although benign exam normal vital signs.  Reviewed work-up with her.  Laura Shah is comfortable plan for the patient follow-up with her PCP.  Return instructions discussed.  Laura Shah has a PPI already at home so I encouraged her to take it regularly.   Final Clinical Impression(s) / ED Diagnoses Final diagnoses:  Nonspecific chest pain    Rx / DC Orders ED Discharge Orders    None       Terrilee Files, MD 01/18/20 1725

## 2020-01-18 NOTE — ED Notes (Signed)
X-ray at bedside

## 2020-01-18 NOTE — ED Triage Notes (Addendum)
Pt reports tightness in the epigastric area since last night; pt reports dry cough, SHOB, and back pain; denies N/V or cardiac hx

## 2020-01-20 ENCOUNTER — Telehealth: Payer: Self-pay | Admitting: Family

## 2020-02-16 ENCOUNTER — Inpatient Hospital Stay: Payer: BC Managed Care – PPO | Admitting: Family

## 2020-02-16 ENCOUNTER — Telehealth: Payer: Self-pay

## 2020-02-16 ENCOUNTER — Other Ambulatory Visit: Payer: Self-pay | Admitting: Family

## 2020-02-16 ENCOUNTER — Inpatient Hospital Stay: Payer: BC Managed Care – PPO

## 2020-02-16 DIAGNOSIS — D649 Anemia, unspecified: Secondary | ICD-10-CM

## 2020-02-16 NOTE — Telephone Encounter (Signed)
appts r.s from 1-19 due to weather, new appt 2-4    aom

## 2020-02-29 ENCOUNTER — Telehealth: Payer: Self-pay

## 2020-02-29 NOTE — Telephone Encounter (Signed)
R/s pts 03/04/20 appts as Maralyn Sago will be out of the office, per jamie it is ok to move pt over to dr Myna Hidalgo, I left a vm for the pt with this information and to call back if needed      Kieran Arreguin

## 2020-03-04 ENCOUNTER — Other Ambulatory Visit: Payer: Self-pay

## 2020-03-04 ENCOUNTER — Inpatient Hospital Stay: Payer: BC Managed Care – PPO | Attending: Hematology & Oncology

## 2020-03-04 ENCOUNTER — Inpatient Hospital Stay: Payer: BC Managed Care – PPO | Admitting: Family

## 2020-03-04 ENCOUNTER — Inpatient Hospital Stay: Payer: BC Managed Care – PPO

## 2020-03-04 ENCOUNTER — Inpatient Hospital Stay (HOSPITAL_BASED_OUTPATIENT_CLINIC_OR_DEPARTMENT_OTHER): Payer: BC Managed Care – PPO | Admitting: Hematology & Oncology

## 2020-03-04 ENCOUNTER — Encounter: Payer: Self-pay | Admitting: Hematology & Oncology

## 2020-03-04 VITALS — BP 129/78 | HR 83 | Temp 98.4°F | Resp 16

## 2020-03-04 DIAGNOSIS — Z9884 Bariatric surgery status: Secondary | ICD-10-CM

## 2020-03-04 DIAGNOSIS — K909 Intestinal malabsorption, unspecified: Secondary | ICD-10-CM | POA: Diagnosis not present

## 2020-03-04 DIAGNOSIS — Z803 Family history of malignant neoplasm of breast: Secondary | ICD-10-CM | POA: Diagnosis not present

## 2020-03-04 DIAGNOSIS — E538 Deficiency of other specified B group vitamins: Secondary | ICD-10-CM | POA: Diagnosis not present

## 2020-03-04 DIAGNOSIS — D500 Iron deficiency anemia secondary to blood loss (chronic): Secondary | ICD-10-CM

## 2020-03-04 DIAGNOSIS — D649 Anemia, unspecified: Secondary | ICD-10-CM

## 2020-03-04 DIAGNOSIS — D509 Iron deficiency anemia, unspecified: Secondary | ICD-10-CM | POA: Insufficient documentation

## 2020-03-04 HISTORY — DX: Iron deficiency anemia secondary to blood loss (chronic): D50.0

## 2020-03-04 HISTORY — DX: Intestinal malabsorption, unspecified: K90.9

## 2020-03-04 LAB — CMP (CANCER CENTER ONLY)
ALT: 7 U/L (ref 0–44)
AST: 12 U/L — ABNORMAL LOW (ref 15–41)
Albumin: 4.4 g/dL (ref 3.5–5.0)
Alkaline Phosphatase: 88 U/L (ref 38–126)
Anion gap: 8 (ref 5–15)
BUN: 8 mg/dL (ref 6–20)
CO2: 26 mmol/L (ref 22–32)
Calcium: 9.6 mg/dL (ref 8.9–10.3)
Chloride: 104 mmol/L (ref 98–111)
Creatinine: 0.86 mg/dL (ref 0.44–1.00)
GFR, Estimated: >60 mL/min (ref >60)
Glucose, Bld: 103 mg/dL — ABNORMAL HIGH (ref 70–99)
Potassium: 3.7 mmol/L (ref 3.5–5.1)
Sodium: 138 mmol/L (ref 135–145)
Total Bilirubin: 0.2 mg/dL — ABNORMAL LOW (ref 0.3–1.2)
Total Protein: 7.5 g/dL (ref 6.5–8.1)

## 2020-03-04 LAB — CBC WITH DIFFERENTIAL (CANCER CENTER ONLY)
Abs Immature Granulocytes: 0.01 10*3/uL (ref 0.00–0.07)
Basophils Absolute: 0 10*3/uL (ref 0.0–0.1)
Basophils Relative: 1 %
Eosinophils Absolute: 0.1 10*3/uL (ref 0.0–0.5)
Eosinophils Relative: 2 %
HCT: 36.7 % (ref 36.0–46.0)
Hemoglobin: 11.6 g/dL — ABNORMAL LOW (ref 12.0–15.0)
Immature Granulocytes: 0 %
Lymphocytes Relative: 33 %
Lymphs Abs: 1.7 10*3/uL (ref 0.7–4.0)
MCH: 25.4 pg — ABNORMAL LOW (ref 26.0–34.0)
MCHC: 31.6 g/dL (ref 30.0–36.0)
MCV: 80.5 fL (ref 80.0–100.0)
Monocytes Absolute: 0.5 10*3/uL (ref 0.1–1.0)
Monocytes Relative: 9 %
Neutro Abs: 2.9 10*3/uL (ref 1.7–7.7)
Neutrophils Relative %: 55 %
Platelet Count: 262 10*3/uL (ref 150–400)
RBC: 4.56 MIL/uL (ref 3.87–5.11)
RDW: 15.1 % (ref 11.5–15.5)
WBC Count: 5.2 10*3/uL (ref 4.0–10.5)
nRBC: 0 % (ref 0.0–0.2)

## 2020-03-04 LAB — LACTATE DEHYDROGENASE: LDH: 133 U/L (ref 98–192)

## 2020-03-04 LAB — RETICULOCYTES
Immature Retic Fract: 3.6 % (ref 2.3–15.9)
RBC.: 4.59 MIL/uL (ref 3.87–5.11)
Retic Count, Absolute: 32.1 10*3/uL (ref 19.0–186.0)
Retic Ct Pct: 0.7 % (ref 0.4–3.1)

## 2020-03-04 LAB — SAVE SMEAR(SSMR), FOR PROVIDER SLIDE REVIEW

## 2020-03-04 NOTE — Progress Notes (Signed)
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 New Patient (Initial Visit)  : My iron is low.  HPI: Laura Shah is a very nice 40 year old African-American female.  She is originally from [place].  She is a Runner, broadcasting/film/video.  She has a 42 year old and a 42 year old.  She underwent a gastric sleeve back a few years ago.  She, at that time, weighed I think about 240 pounds.  She has had some problems with iron deficiency.  She is on Protonix.  She also has B12 deficiency.  She gets B12 injections.  In November, she had some iron studies done.  Her iron saturation was only 4%.  She does have monthly cycles.  They are not unusually heavy.  She does chew ice.  She does not smoke.  She has an occasional glass of wine.  There is no history of sickle cell in the family.  She is not a vegetarian.  She has had back surgery.  She had a couple back surgeries.  She had her gallbladder taken out.  She has had C-sections.  There is been no rashes.  She has had no fever.  She has had no problems with the coronavirus.  She was currently referred to the [facility] for an evaluation.  Currently, her performance status is ECOG 0.   Past Medical History:  Diagnosis Date  . Acute pancreatitis 03/16/2015  . Anemia   . Anxiety   . Back pain   . Cellulitis 09/2018   abdomen   . Constipation by delayed colonic transit   . Fatigue 10/27/2018  . GERD (gastroesophageal reflux disease)   . Heart valve regurgitation    trivial PR, MR, TR 2014 echo [facility])  . 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 Ileus (HCC)   . Interstitial cystitis   . Irritable bowel syndrome with constipation   . Lumbar herniated disc 04/17/2017   L4/L5. Ruptured.  . OSA (obstructive sleep apnea) 03/16/2015  . Palpitations 10/27/2018  . Pancreatitis   . Portal vein thrombosis  03/17/2015  . Right leg weakness    and numbness   . Shortness of breath 10/27/2018  . Tachycardia   :  Past Surgical History:  Procedure Laterality Date  . ABDOMINAL EXPOSURE N/A 10/02/2018   Procedure: ABDOMINAL EXPOSURE;  Surgeon: Larina Earthly, MD;  Location[facility] OR;  Service: Vascular;  Laterality: N/A;  . ANTERIOR LUMBAR FUSION N/A 10/02/2018   Procedure: Lumbar four-five Anterior lumbar interbody;  Surgeon: Barnett Abu, MD;  Location: [facility] OR;  Service: Neurosurgery;  Laterality: N/A;  . APPENDECTOMY    . BARIATRIC SURGERY    . CESAREAN SECTION    . CHOLECYSTECTOMY    . ESOPHAGOGASTRODUODENOSCOPY (EGD) WITH PROPOFOL N/A 03/18/2015   Procedure: ESOPHAGOGASTRODUODENOSCOPY (EGD) WITH PROPOFOL;  Surgeon: Sherrilyn Rist, MD;  Location: [facility] ENDOSCOPY;  Service: Endoscopy;  Laterality: N/A;  . GASTRECTOMY     laparoscopic sleeve gastrectomy 01/27/15  . Leg Pain    . LUMBAR DISC SURGERY     L4-5 discectomy 05/2017  . OVARIAN CYST REMOVAL     Ruptured  :   Current Outpatient Medications:  .  celecoxib (CELEBREX) 200 MG capsule, Take 200 mg by mouth daily., Disp: , Rfl:  .  diphenhydrAMINE (BENADRYL) 50 MG tablet, Take 0.5 tablets (25 mg total) by mouth every  8 (eight) hours as needed for itching., Disp: 10 tablet, Rfl: 0 .  metroNIDAZOLE (FLAGYL) 500 MG tablet, Take 500 mg by mouth 2 (two) times daily., Disp: , Rfl:  .  pantoprazole (PROTONIX) 40 MG tablet, Take 1 tablet (40 mg total) by mouth daily., Disp: 30 tablet, Rfl: 0:  :  Allergies  Allergen Reactions  . Metoclopramide Anxiety and Other (See Comments)  . Morphine And Related Itching  . Morphine Sulfate Other (See Comments)  . Other Other (See Comments)  :  Family History  Problem Relation Age of Onset  . Breast cancer Maternal Grandmother   . Breast cancer Paternal Grandmother   . Diabetes Paternal Grandmother   . Diabetes Father   . Hypertension Father   :  Social History   Socioeconomic History  . Marital  status: Single    Spouse name: Not on file  . Number of children: 2  . Years of education: Grad   . Highest education level: Not on file  Occupational History  . Not on file  Tobacco Use  . Smoking status: Never Smoker  . Smokeless tobacco: Never Used  Vaping Use  . Vaping Use: Never used  Substance and Sexual Activity  . Alcohol use: No    Alcohol/week: 0.0 standard drinks  . Drug use: No  . Sexual activity: Not on file  Other Topics Concern  . Not on file  Social History Narrative   Drinks about 1-2 cans of soda a day    Social Determinants of Health   Financial Resource Strain: Not on file  Food Insecurity: Not on file  Transportation Needs: Not on file  Physical Activity: Not on file  Stress: Not on file  Social Connections: Not on file  Intimate Partner Violence: Not on file  :  Review of Systems  Constitutional: Positive for malaise/fatigue.  HENT: Negative.   Eyes: Negative.   Respiratory: Negative.   Cardiovascular: Negative.   Gastrointestinal: Negative.   Genitourinary: Negative.   Musculoskeletal: Negative.   Skin: Negative.   Neurological: Negative.   Endo/Heme/Allergies: Negative.   Psychiatric/Behavioral: Negative.      Exam:  This is a well-developed and well-nourished African-American female in no obvious distress.  Vital signs show temperature of 98.4.  Pulse 83.  Blood pressure 129/78.  Weight is 165 pounds.  Head and neck exam shows no ocular or oral lesions.  She has no scleral icterus.  Her conjunctiva are slightly pale.  She has no palpable thyroid.  There is no adenopathy in the neck.  Lungs are clear bilaterally.  Cardiac exam regular rate and rhythm with no murmurs, rubs or bruits.  Abdomen is soft.  She has laparoscopy scars that are well healed.  She has no fluid wave.  There is no palpable liver or spleen tip.  Back exam shows no tenderness over the spine, ribs or hips.  She has laminectomy scar in the lumbar spine.  Extremity shows no  clubbing, cyanosis or edema.  Neurological exam shows no focal neurological deficits.  Skin exam shows no rashes, ecchymoses or petechia.    @IPVITALS @   Recent Labs    03/04/20 1511  WBC 5.2  HGB 11.6*  HCT 36.7  PLT 262   Recent Labs    03/04/20 1511  NA 138  K 3.7  CL 104  CO2 26  GLUCOSE 103*  BUN 8  CREATININE 0.86  CALCIUM 9.6    Blood smear review: Some slight anisocytosis and poikilocytosis.  She  has some microcytic red blood cells.  There is no target cells.  I see no nucleated red blood cells.  There are no schistocytes.  White cells appear normal in morphology and maturation.  She has no hypersegmented polys.  There is no immature myeloid or lymphoid forms.  Platelets are adequate in number and size.  Pathology: None    Assessment and Plan: Laura Shah is a very charming 40 year old African-American female.  She has had a gastric sleeve.  She currently has some iron deficiency problems.  She is mildly anemic.  We will have to see what the iron levels are.  I am sure that she will need IV iron.  I am sure that oral iron is not going to work.  She has had a gastric sleeve.  She has been taking Protonix.  Right now, we will have to see what the iron studies show.  I feel very confident that we will improve her anemia.  We will set iron up for her once we get the results back.  I like we will see her back probably in about 6 weeks.  By then, she would have had IV iron.

## 2020-03-05 LAB — ERYTHROPOIETIN: Erythropoietin: 16.3 m[IU]/mL (ref 2.6–18.5)

## 2020-03-07 ENCOUNTER — Telehealth: Payer: Self-pay | Admitting: *Deleted

## 2020-03-07 LAB — IRON AND TIBC
Iron: 31 ug/dL — ABNORMAL LOW (ref 41–142)
Saturation Ratios: 6 % — ABNORMAL LOW (ref 21–57)
TIBC: 478 ug/dL — ABNORMAL HIGH (ref 236–444)
UIBC: 447 ug/dL — ABNORMAL HIGH (ref 120–384)

## 2020-03-07 LAB — FERRITIN: Ferritin: 12 ng/mL (ref 11–307)

## 2020-03-07 NOTE — Telephone Encounter (Signed)
Call patient and lvm of upcoming appts.

## 2020-03-08 ENCOUNTER — Other Ambulatory Visit: Payer: Self-pay | Admitting: Family

## 2020-03-08 LAB — HGB FRACTIONATION CASCADE
Hgb A2: 2.5 % (ref 1.8–3.2)
Hgb A: 97.5 % (ref 96.4–98.8)
Hgb F: 0 % (ref 0.0–2.0)
Hgb S: 0 %

## 2020-03-11 ENCOUNTER — Inpatient Hospital Stay: Payer: BC Managed Care – PPO

## 2020-03-11 ENCOUNTER — Other Ambulatory Visit: Payer: Self-pay

## 2020-03-11 VITALS — BP 116/70 | HR 83 | Temp 99.2°F | Resp 16

## 2020-03-11 DIAGNOSIS — D509 Iron deficiency anemia, unspecified: Secondary | ICD-10-CM | POA: Diagnosis not present

## 2020-03-11 DIAGNOSIS — D500 Iron deficiency anemia secondary to blood loss (chronic): Secondary | ICD-10-CM

## 2020-03-11 DIAGNOSIS — K909 Intestinal malabsorption, unspecified: Secondary | ICD-10-CM

## 2020-03-11 MED ORDER — SODIUM CHLORIDE 0.9 % IV SOLN
Freq: Once | INTRAVENOUS | Status: AC
  Filled 2020-03-11: qty 250

## 2020-03-11 MED ORDER — SODIUM CHLORIDE 0.9 % IV SOLN
125.0000 mg | Freq: Once | INTRAVENOUS | Status: AC
  Administered 2020-03-11: 125 mg via INTRAVENOUS
  Filled 2020-03-11: qty 10

## 2020-03-11 NOTE — Patient Instructions (Signed)

## 2020-03-16 LAB — ALPHA-THALASSEMIA GENOTYPR

## 2020-03-18 ENCOUNTER — Other Ambulatory Visit: Payer: Self-pay

## 2020-03-18 ENCOUNTER — Inpatient Hospital Stay: Payer: BC Managed Care – PPO

## 2020-03-18 VITALS — BP 120/83 | HR 99 | Temp 98.0°F | Resp 18

## 2020-03-18 DIAGNOSIS — D500 Iron deficiency anemia secondary to blood loss (chronic): Secondary | ICD-10-CM

## 2020-03-18 DIAGNOSIS — K909 Intestinal malabsorption, unspecified: Secondary | ICD-10-CM

## 2020-03-18 DIAGNOSIS — D509 Iron deficiency anemia, unspecified: Secondary | ICD-10-CM | POA: Diagnosis not present

## 2020-03-18 MED ORDER — ACETAMINOPHEN 325 MG PO TABS
650.0000 mg | ORAL_TABLET | Freq: Once | ORAL | Status: AC
  Administered 2020-03-18: 650 mg via ORAL

## 2020-03-18 MED ORDER — NA FERRIC GLUC CPLX IN SUCROSE 12.5 MG/ML IV SOLN
125.0000 mg | Freq: Once | INTRAVENOUS | Status: AC
  Administered 2020-03-18: 125 mg via INTRAVENOUS
  Filled 2020-03-18: qty 10

## 2020-03-18 MED ORDER — ACETAMINOPHEN 325 MG PO TABS
ORAL_TABLET | ORAL | Status: AC
  Filled 2020-03-18: qty 2

## 2020-03-18 MED ORDER — SODIUM CHLORIDE 0.9 % IV SOLN
Freq: Once | INTRAVENOUS | Status: AC
  Filled 2020-03-18: qty 250

## 2020-03-18 NOTE — Patient Instructions (Signed)
Sodium Ferric Gluconate Complex injection What is this medicine? SODIUM FERRIC GLUCONATE COMPLEX (SOE dee um FER ik GLOO koe nate KOM pleks) is an iron replacement. It is used with epoetin therapy to treat low iron levels in patients who are receiving hemodialysis. This medicine may be used for other purposes; ask your health care provider or pharmacist if you have questions. COMMON BRAND NAME(S): Ferrlecit, Nulecit What should I tell my health care provider before I take this medicine? They need to know if you have any of the following conditions:  anemia that is not from iron deficiency  high levels of iron in the body  an unusual or allergic reaction to iron, benzyl alcohol, other medicines, foods, dyes, or preservatives  pregnant or are trying to become pregnant  breast-feeding How should I use this medicine? This medicine is for infusion into a vein. It is given by a health care professional in a hospital or clinic setting. Talk to your pediatrician regarding the use of this medicine in children. While this drug may be prescribed for children as young as 6 years old for selected conditions, precautions do apply. Overdosage: If you think you have taken too much of this medicine contact a poison control center or emergency room at once. NOTE: This medicine is only for you. Do not share this medicine with others. What if I miss a dose? It is important not to miss your dose. Call your doctor or health care professional if you are unable to keep an appointment. What may interact with this medicine? Do not take this medicine with any of the following medications:  deferoxamine  dimercaprol  other iron products This medicine may also interact with the following medications:  chloramphenicol  deferasirox  medicine for blood pressure like enalapril This list may not describe all possible interactions. Give your health care provider a list of all the medicines, herbs,  non-prescription drugs, or dietary supplements you use. Also tell them if you smoke, drink alcohol, or use illegal drugs. Some items may interact with your medicine. What should I watch for while using this medicine? Your condition will be monitored carefully while you are receiving this medicine. Visit your doctor for check-ups as directed. What side effects may I notice from receiving this medicine? Side effects that you should report to your doctor or health care professional as soon as possible:  allergic reactions like skin rash, itching or hives, swelling of the face, lips, or tongue  breathing problems  changes in hearing  changes in vision  chills, flushing, or sweating  fast, irregular heartbeat  feeling faint or lightheaded, falls  fever, flu-like symptoms  high or low blood pressure  pain, tingling, numbness in the hands or feet  severe pain in the chest, back, flanks, or groin  swelling of the ankles, feet, hands  trouble passing urine or change in the amount of urine  unusually weak or tired Side effects that usually do not require medical attention (report to your doctor or health care professional if they continue or are bothersome):  cramps  dark colored stools  diarrhea  headache  nausea, vomiting  stomach upset This list may not describe all possible side effects. Call your doctor for medical advice about side effects. You may report side effects to FDA at 1-800-FDA-1088. Where should I keep my medicine? This drug is given in a hospital or clinic and will not be stored at home. NOTE: This sheet is a summary. It may not cover all   possible information. If you have questions about this medicine, talk to your doctor, pharmacist, or health care provider.  2021 Elsevier/Gold Standard (2007-09-17 15:58:57)   

## 2020-04-14 ENCOUNTER — Other Ambulatory Visit: Payer: Self-pay

## 2020-04-14 ENCOUNTER — Encounter: Payer: Self-pay | Admitting: Family

## 2020-04-14 ENCOUNTER — Inpatient Hospital Stay: Payer: BC Managed Care – PPO | Attending: Hematology & Oncology

## 2020-04-14 ENCOUNTER — Inpatient Hospital Stay: Payer: BC Managed Care – PPO | Admitting: Family

## 2020-04-14 VITALS — BP 115/88 | HR 98 | Resp 18 | Ht 62.0 in | Wt 165.0 lb

## 2020-04-14 DIAGNOSIS — Z9884 Bariatric surgery status: Secondary | ICD-10-CM | POA: Diagnosis not present

## 2020-04-14 DIAGNOSIS — N920 Excessive and frequent menstruation with regular cycle: Secondary | ICD-10-CM | POA: Diagnosis not present

## 2020-04-14 DIAGNOSIS — D500 Iron deficiency anemia secondary to blood loss (chronic): Secondary | ICD-10-CM | POA: Diagnosis not present

## 2020-04-14 DIAGNOSIS — K909 Intestinal malabsorption, unspecified: Secondary | ICD-10-CM | POA: Diagnosis not present

## 2020-04-14 DIAGNOSIS — D560 Alpha thalassemia: Secondary | ICD-10-CM | POA: Diagnosis not present

## 2020-04-14 LAB — CBC WITH DIFFERENTIAL (CANCER CENTER ONLY)
Abs Immature Granulocytes: 0.02 10*3/uL (ref 0.00–0.07)
Basophils Absolute: 0 10*3/uL (ref 0.0–0.1)
Basophils Relative: 1 %
Eosinophils Absolute: 0.1 10*3/uL (ref 0.0–0.5)
Eosinophils Relative: 2 %
HCT: 36.3 % (ref 36.0–46.0)
Hemoglobin: 11.3 g/dL — ABNORMAL LOW (ref 12.0–15.0)
Immature Granulocytes: 0 %
Lymphocytes Relative: 27 %
Lymphs Abs: 1.6 10*3/uL (ref 0.7–4.0)
MCH: 25.9 pg — ABNORMAL LOW (ref 26.0–34.0)
MCHC: 31.1 g/dL (ref 30.0–36.0)
MCV: 83.1 fL (ref 80.0–100.0)
Monocytes Absolute: 0.6 10*3/uL (ref 0.1–1.0)
Monocytes Relative: 10 %
Neutro Abs: 3.6 10*3/uL (ref 1.7–7.7)
Neutrophils Relative %: 60 %
Platelet Count: 258 10*3/uL (ref 150–400)
RBC: 4.37 MIL/uL (ref 3.87–5.11)
RDW: 15.7 % — ABNORMAL HIGH (ref 11.5–15.5)
WBC Count: 5.9 10*3/uL (ref 4.0–10.5)
nRBC: 0 % (ref 0.0–0.2)

## 2020-04-14 LAB — CMP (CANCER CENTER ONLY)
ALT: 9 U/L (ref 0–44)
AST: 13 U/L — ABNORMAL LOW (ref 15–41)
Albumin: 4.3 g/dL (ref 3.5–5.0)
Alkaline Phosphatase: 87 U/L (ref 38–126)
Anion gap: 6 (ref 5–15)
BUN: 13 mg/dL (ref 6–20)
CO2: 29 mmol/L (ref 22–32)
Calcium: 9.5 mg/dL (ref 8.9–10.3)
Chloride: 104 mmol/L (ref 98–111)
Creatinine: 0.87 mg/dL (ref 0.44–1.00)
GFR, Estimated: >60 mL/min (ref >60)
Glucose, Bld: 87 mg/dL (ref 70–99)
Potassium: 4.3 mmol/L (ref 3.5–5.1)
Sodium: 139 mmol/L (ref 135–145)
Total Bilirubin: 0.2 mg/dL — ABNORMAL LOW (ref 0.3–1.2)
Total Protein: 6.8 g/dL (ref 6.5–8.1)

## 2020-04-14 LAB — RETICULOCYTES
Immature Retic Fract: 4.4 % (ref 2.3–15.9)
RBC.: 4.37 MIL/uL (ref 3.87–5.11)
Retic Count, Absolute: 42.4 10*3/uL (ref 19.0–186.0)
Retic Ct Pct: 1 % (ref 0.4–3.1)

## 2020-04-14 LAB — SAVE SMEAR(SSMR), FOR PROVIDER SLIDE REVIEW

## 2020-04-14 MED ORDER — FOLIC ACID 1 MG PO TABS
1.0000 mg | ORAL_TABLET | Freq: Every day | ORAL | 11 refills | Status: DC
Start: 2020-04-14 — End: 2023-10-09

## 2020-04-14 NOTE — Progress Notes (Signed)
Hematology and Oncology Follow Up Visit  Laura Shah [id number] [birth year] 40 y.o. 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 is here today for follow-up. She is doing well and has no complaints at this time.  She notes that her last cycle after getting IV iron was heavier the first day than normal. No other blood loss noted. No abnormal bruising, no petechiae.  No fever, chills, n/v, cough, rash, SOB, chest pain, abdominal pain or changes in bowl or bladder habits.  She notes occasional palpitations.  She has chronic constipation and recently had a colonoscopy with [facility] Dr. Burnard Bunting which she states was negative.  She notes occasional dizziness with vertigo.  No swelling in her extremities.  She has occasional tingling in her hands.  She has a good appetite and is staying well hydrated. Her weight is stable.  She stays quite busy working as a [employer] and taking care of her own sweet kiddos.   ECOG Performance Status: 1 - Symptomatic but completely ambulatory  Medications:  Allergies as of 04/14/2020      Reactions   Metoclopramide Anxiety, Other (See Comments)   Morphine And Related Itching   Morphine Sulfate Other (See Comments)   Other Other (See Comments)      Medication List       Accurate as of April 14, 2020  3:06 PM. If you have any questions, ask your nurse or doctor.        celecoxib 200 MG capsule Commonly known as: CELEBREX Take 200 mg by mouth daily.   diphenhydrAMINE 50 MG tablet Commonly known as: BENADRYL Take 0.5 tablets (25 mg total) by mouth every 8 (eight) hours as needed for itching.   metroNIDAZOLE 500 MG tablet Commonly known as: FLAGYL Take 500 mg by mouth 2 (two) times daily.   pantoprazole 40 MG  tablet Commonly known as: PROTONIX Take 1 tablet (40 mg total) by mouth daily.       Allergies:  Allergies  Allergen Reactions  . Metoclopramide Anxiety and Other (See Comments)  . Morphine And Related Itching  . Morphine Sulfate Other (See Comments)  . Other Other (See Comments)    Past Medical History, Surgical history, Social history, and Family History were reviewed and updated.  Review of Systems: All other 10 point review of systems is negative.   Physical Exam:  vitals were not taken for this visit.   Wt Readings from Last 3 Encounters:  01/18/20 150 lb (68 kg)  05/16/19 150 lb (68 kg)  11/17/18 153 lb 6.4 oz (69.6 kg)    Ocular: Sclerae unicteric, pupils equal, round and reactive to light Ear-nose-throat: Oropharynx clear, dentition fair Lymphatic: No cervical or supraclavicular adenopathy Lungs no rales or rhonchi, good excursion bilaterally Heart regular rate and rhythm, no murmur appreciated Abd soft, nontender, positive bowel sounds MSK no focal spinal tenderness, no joint edema Neuro: non-focal, well-oriented, appropriate affect Breasts: Deferred   Lab Results  Component Value Date   WBC 5.2 03/04/2020   HGB 11.6 (L) 03/04/2020   HCT 36.7 03/04/2020   MCV 80.5 03/04/2020   PLT 262 03/04/2020   Lab Results  Component Value Date   FERRITIN 12 03/04/2020   IRON 31 (L) 03/04/2020   TIBC 478 (  H) 03/04/2020   UIBC 447 (H) 03/04/2020   IRONPCTSAT 6 (L) 03/04/2020   Lab Results  Component Value Date   RETICCTPCT 0.7 03/04/2020   RBC 4.59 03/04/2020   No results found for: KPAFRELGTCHN, LAMBDASER, KAPLAMBRATIO No results found for: IGGSERUM, IGA, IGMSERUM No results found for: Marda Stalker, SPEI   Chemistry      Component Value Date/Time   NA 138 03/04/2020 1511   K 3.7 03/04/2020 1511   CL 104 03/04/2020 1511   CO2 26 03/04/2020 1511   BUN 8 03/04/2020 1511   CREATININE 0.86 03/04/2020  1511      Component Value Date/Time   CALCIUM 9.6 03/04/2020 1511   ALKPHOS 88 03/04/2020 1511   AST 12 (L) 03/04/2020 1511   ALT 7 03/04/2020 1511   BILITOT 0.2 (L) 03/04/2020 1511       Impression and Plan: Laura Shah is a very pleasant 40 yo African-American female iron deficiency anemia secondary to heavy cycles and malabsorption post gastric sleeve surgery.  Iron studies are pending. We can replace if needed.  Follow-up in 3 months.  We will have her start taking folic acid PO daily.  She can contact our office with any questions or concerns.   Emeline Gins, NP 3/17/20223:06 PM

## 2020-04-15 ENCOUNTER — Inpatient Hospital Stay: Payer: BC Managed Care – PPO

## 2020-04-15 ENCOUNTER — Inpatient Hospital Stay: Payer: BC Managed Care – PPO | Admitting: Family

## 2020-04-15 LAB — IRON AND TIBC
Iron: 63 ug/dL (ref 41–142)
Saturation Ratios: 17 % — ABNORMAL LOW (ref 21–57)
TIBC: 369 ug/dL (ref 236–444)
UIBC: 306 ug/dL (ref 120–384)

## 2020-04-15 LAB — FERRITIN: Ferritin: 47 ng/mL (ref 11–307)

## 2020-04-25 ENCOUNTER — Telehealth: Payer: Self-pay

## 2020-04-25 NOTE — Telephone Encounter (Signed)
Called pt per 04/19/20 sch message that she needed 2 doses of iron, a message was lft for her 04/19/20 at 2:04 and 3/258/22 at 8:36 to call     [name]

## 2020-04-28 ENCOUNTER — Telehealth: Payer: Self-pay

## 2020-04-28 NOTE — Telephone Encounter (Signed)
S/w pt and we have sch her iron tx appts, pt req to wait unt il spring break   Laura Shah

## 2020-05-13 ENCOUNTER — Other Ambulatory Visit: Payer: Self-pay

## 2020-05-13 ENCOUNTER — Inpatient Hospital Stay: Payer: BC Managed Care – PPO | Attending: Hematology & Oncology

## 2020-05-13 VITALS — BP 119/57 | HR 80 | Temp 98.4°F | Resp 18

## 2020-05-13 DIAGNOSIS — N920 Excessive and frequent menstruation with regular cycle: Secondary | ICD-10-CM | POA: Diagnosis not present

## 2020-05-13 DIAGNOSIS — D500 Iron deficiency anemia secondary to blood loss (chronic): Secondary | ICD-10-CM | POA: Diagnosis not present

## 2020-05-13 DIAGNOSIS — K909 Intestinal malabsorption, unspecified: Secondary | ICD-10-CM

## 2020-05-13 DIAGNOSIS — Z9884 Bariatric surgery status: Secondary | ICD-10-CM | POA: Diagnosis not present

## 2020-05-13 MED ORDER — SODIUM CHLORIDE 0.9 % IV SOLN
125.0000 mg | Freq: Once | INTRAVENOUS | Status: AC
  Administered 2020-05-13: 125 mg via INTRAVENOUS
  Filled 2020-05-13: qty 10

## 2020-05-13 MED ORDER — SODIUM CHLORIDE 0.9 % IV SOLN
Freq: Once | INTRAVENOUS | Status: AC
  Filled 2020-05-13: qty 250

## 2020-05-13 NOTE — Patient Instructions (Signed)
Sodium Ferric Gluconate Complex injection What is this medicine? SODIUM FERRIC GLUCONATE COMPLEX (SOE dee um FER ik GLOO koe nate KOM pleks) is an iron replacement. It is used with epoetin therapy to treat low iron levels in patients who are receiving hemodialysis. This medicine may be used for other purposes; ask your health care provider or pharmacist if you have questions. COMMON BRAND NAME(S): Ferrlecit, Nulecit What should I tell my health care provider before I take this medicine? They need to know if you have any of the following conditions:  anemia that is not from iron deficiency  high levels of iron in the body  an unusual or allergic reaction to iron, benzyl alcohol, other medicines, foods, dyes, or preservatives  pregnant or are trying to become pregnant  breast-feeding How should I use this medicine? This medicine is for infusion into a vein. It is given by a health care professional in a hospital or clinic setting. Talk to your pediatrician regarding the use of this medicine in children. While this drug may be prescribed for children as young as 6 years old for selected conditions, precautions do apply. Overdosage: If you think you have taken too much of this medicine contact a poison control center or emergency room at once. NOTE: This medicine is only for you. Do not share this medicine with others. What if I miss a dose? It is important not to miss your dose. Call your doctor or health care professional if you are unable to keep an appointment. What may interact with this medicine? Do not take this medicine with any of the following medications:  deferoxamine  dimercaprol  other iron products This medicine may also interact with the following medications:  chloramphenicol  deferasirox  medicine for blood pressure like enalapril This list may not describe all possible interactions. Give your health care provider a list of all the medicines, herbs,  non-prescription drugs, or dietary supplements you use. Also tell them if you smoke, drink alcohol, or use illegal drugs. Some items may interact with your medicine. What should I watch for while using this medicine? Your condition will be monitored carefully while you are receiving this medicine. Visit your doctor for check-ups as directed. What side effects may I notice from receiving this medicine? Side effects that you should report to your doctor or health care professional as soon as possible:  allergic reactions like skin rash, itching or hives, swelling of the face, lips, or tongue  breathing problems  changes in hearing  changes in vision  chills, flushing, or sweating  fast, irregular heartbeat  feeling faint or lightheaded, falls  fever, flu-like symptoms  high or low blood pressure  pain, tingling, numbness in the hands or feet  severe pain in the chest, back, flanks, or groin  swelling of the ankles, feet, hands  trouble passing urine or change in the amount of urine  unusually weak or tired Side effects that usually do not require medical attention (report to your doctor or health care professional if they continue or are bothersome):  cramps  dark colored stools  diarrhea  headache  nausea, vomiting  stomach upset This list may not describe all possible side effects. Call your doctor for medical advice about side effects. You may report side effects to FDA at 1-800-FDA-1088. Where should I keep my medicine? This drug is given in a hospital or clinic and will not be stored at home. NOTE: This sheet is a summary. It may not cover all   possible information. If you have questions about this medicine, talk to your doctor, pharmacist, or health care provider.  2021 Elsevier/Gold Standard (2007-09-17 15:58:57)   

## 2020-05-20 ENCOUNTER — Other Ambulatory Visit: Payer: Self-pay

## 2020-05-20 ENCOUNTER — Inpatient Hospital Stay: Payer: BC Managed Care – PPO

## 2020-05-20 VITALS — BP 114/64 | HR 85 | Temp 97.8°F | Resp 18

## 2020-05-20 DIAGNOSIS — D500 Iron deficiency anemia secondary to blood loss (chronic): Secondary | ICD-10-CM

## 2020-05-20 DIAGNOSIS — K909 Intestinal malabsorption, unspecified: Secondary | ICD-10-CM

## 2020-05-20 MED ORDER — ACETAMINOPHEN 325 MG PO TABS
650.0000 mg | ORAL_TABLET | Freq: Once | ORAL | Status: AC
  Administered 2020-05-20: 650 mg via ORAL

## 2020-05-20 MED ORDER — NA FERRIC GLUC CPLX IN SUCROSE 12.5 MG/ML IV SOLN
125.0000 mg | Freq: Once | INTRAVENOUS | Status: AC
  Administered 2020-05-20: 125 mg via INTRAVENOUS
  Filled 2020-05-20: qty 10

## 2020-05-20 MED ORDER — SODIUM CHLORIDE 0.9 % IV SOLN
Freq: Once | INTRAVENOUS | Status: AC
  Filled 2020-05-20: qty 250

## 2020-05-20 MED ORDER — ACETAMINOPHEN 325 MG PO TABS
ORAL_TABLET | ORAL | Status: AC
  Filled 2020-05-20: qty 2

## 2020-07-15 ENCOUNTER — Encounter: Payer: Self-pay | Admitting: Family

## 2020-07-15 ENCOUNTER — Other Ambulatory Visit: Payer: Self-pay

## 2020-07-15 ENCOUNTER — Inpatient Hospital Stay: Payer: BC Managed Care – PPO | Attending: Hematology & Oncology

## 2020-07-15 ENCOUNTER — Inpatient Hospital Stay (HOSPITAL_BASED_OUTPATIENT_CLINIC_OR_DEPARTMENT_OTHER): Payer: BC Managed Care – PPO | Admitting: Family

## 2020-07-15 ENCOUNTER — Telehealth: Payer: Self-pay | Admitting: *Deleted

## 2020-07-15 ENCOUNTER — Other Ambulatory Visit: Payer: Self-pay | Admitting: Family

## 2020-07-15 VITALS — BP 142/69 | HR 96 | Temp 98.6°F | Resp 17 | Wt 174.0 lb

## 2020-07-15 DIAGNOSIS — E538 Deficiency of other specified B group vitamins: Secondary | ICD-10-CM

## 2020-07-15 DIAGNOSIS — D500 Iron deficiency anemia secondary to blood loss (chronic): Secondary | ICD-10-CM | POA: Diagnosis present

## 2020-07-15 DIAGNOSIS — K909 Intestinal malabsorption, unspecified: Secondary | ICD-10-CM | POA: Diagnosis not present

## 2020-07-15 DIAGNOSIS — N920 Excessive and frequent menstruation with regular cycle: Secondary | ICD-10-CM | POA: Diagnosis not present

## 2020-07-15 DIAGNOSIS — Z9884 Bariatric surgery status: Secondary | ICD-10-CM | POA: Diagnosis not present

## 2020-07-15 LAB — CMP (CANCER CENTER ONLY)
ALT: 9 U/L (ref 0–44)
AST: 10 U/L — ABNORMAL LOW (ref 15–41)
Albumin: 4.2 g/dL (ref 3.5–5.0)
Alkaline Phosphatase: 101 U/L (ref 38–126)
Anion gap: 9 (ref 5–15)
BUN: 14 mg/dL (ref 6–20)
CO2: 26 mmol/L (ref 22–32)
Calcium: 9.9 mg/dL (ref 8.9–10.3)
Chloride: 102 mmol/L (ref 98–111)
Creatinine: 0.85 mg/dL (ref 0.44–1.00)
GFR, Estimated: >60 mL/min (ref >60)
Glucose, Bld: 111 mg/dL — ABNORMAL HIGH (ref 70–99)
Potassium: 3.5 mmol/L (ref 3.5–5.1)
Sodium: 137 mmol/L (ref 135–145)
Total Bilirubin: 0.3 mg/dL (ref 0.3–1.2)
Total Protein: 7.6 g/dL (ref 6.5–8.1)

## 2020-07-15 LAB — CBC WITH DIFFERENTIAL (CANCER CENTER ONLY)
Abs Immature Granulocytes: 0.03 10*3/uL (ref 0.00–0.07)
Basophils Absolute: 0 10*3/uL (ref 0.0–0.1)
Basophils Relative: 1 %
Eosinophils Absolute: 0.2 10*3/uL (ref 0.0–0.5)
Eosinophils Relative: 3 %
HCT: 36.5 % (ref 36.0–46.0)
Hemoglobin: 11.6 g/dL — ABNORMAL LOW (ref 12.0–15.0)
Immature Granulocytes: 1 %
Lymphocytes Relative: 26 %
Lymphs Abs: 1.7 10*3/uL (ref 0.7–4.0)
MCH: 27.4 pg (ref 26.0–34.0)
MCHC: 31.8 g/dL (ref 30.0–36.0)
MCV: 86.1 fL (ref 80.0–100.0)
Monocytes Absolute: 0.6 10*3/uL (ref 0.1–1.0)
Monocytes Relative: 9 %
Neutro Abs: 3.9 10*3/uL (ref 1.7–7.7)
Neutrophils Relative %: 60 %
Platelet Count: 254 10*3/uL (ref 150–400)
RBC: 4.24 MIL/uL (ref 3.87–5.11)
RDW: 14.6 % (ref 11.5–15.5)
WBC Count: 6.4 10*3/uL (ref 4.0–10.5)
nRBC: 0 % (ref 0.0–0.2)

## 2020-07-15 LAB — IRON AND TIBC
Iron: 31 ug/dL (ref 28–170)
Saturation Ratios: 7 % — ABNORMAL LOW (ref 10.4–31.8)
TIBC: 433 ug/dL (ref 250–450)
UIBC: 402 ug/dL

## 2020-07-15 LAB — RETICULOCYTES
Immature Retic Fract: 6.2 % (ref 2.3–15.9)
RBC.: 4.2 MIL/uL (ref 3.87–5.11)
Retic Count, Absolute: 49.6 10*3/uL (ref 19.0–186.0)
Retic Ct Pct: 1.2 % (ref 0.4–3.1)

## 2020-07-15 LAB — VITAMIN B12: Vitamin B-12: 216 pg/mL (ref 180–914)

## 2020-07-15 LAB — FERRITIN: Ferritin: 38 ng/mL (ref 11–307)

## 2020-07-15 NOTE — Progress Notes (Signed)
Hematology and Oncology Follow Up Visit  Laura Shah [id number] [birth year] 40 y.o. 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 She is feeling fatigue, notes dizziness, tingling in her hands and feet and has had soreness in her legs with stairs.  Hgb is 11.6, MCV 86. Iron studies are pending and we have added a B 12 level as well.  Her cycles is regular with heavy flow at times. No other blood loss noted.  She is bruising easily but not in excess. No petechiae.  No fever, chills, n/v, cough, rash, SOB, chest pain, palpitations, abdominal pain or changes in bowel or bladder habits.  No swelling in her extremities. No falls or syncope to report.  She has maintained a good appetite and is staying well hydrated. Her weight is 174 lbs.   ECOG Performance Status: 1 - Symptomatic but completely ambulatory  Medications:  Allergies as of 07/15/2020       Reactions   Metoclopramide Anxiety, Other (See Comments)   Morphine And Related Itching   Morphine Sulfate Other (See Comments)   Other Other (See Comments)        Medication List        Accurate as of July 15, 2020  3:14 PM. If you have any questions, ask your nurse or doctor.          buPROPion 75 MG tablet Commonly known as: WELLBUTRIN Take 75 mg by mouth daily at 6 (six) AM.   celecoxib 200 MG capsule Commonly known as: CELEBREX Take 200 mg by mouth daily.   DULoxetine 20 MG capsule Commonly known as: CYMBALTA Take by mouth.   folic acid 1 MG tablet Commonly known as: FOLVITE Take 1 tablet (1 mg total) by mouth daily.   hydrOXYzine 25 MG capsule Commonly known as: VISTARIL Take 25 mg by mouth daily as needed.   pantoprazole 40 MG tablet Commonly known  as: PROTONIX Take 1 tablet (40 mg total) by mouth daily.   traZODone 50 MG tablet Commonly known as: DESYREL Take 25-50 mg by mouth at bedtime as needed.        Allergies:  Allergies  Allergen Reactions   Metoclopramide Anxiety and Other (See Comments)   Morphine And Related Itching   Morphine Sulfate Other (See Comments)   Other Other (See Comments)    Past Medical History, Surgical history, Social history, and Family History were reviewed and updated.  Review of Systems: All other 10 point review of systems is negative.   Physical Exam:  weight is 174 lb (78.9 kg). Her oral temperature is 98.6 F (37 C). Her blood pressure is 142/69 (abnormal) and her pulse is 96. Her respiration is 17 and oxygen saturation is 100%.   Wt Readings from Last 3 Encounters:  07/15/20 174 lb (78.9 kg)  04/14/20 165 lb (74.8 kg)  01/18/20 150 lb (68 kg)    Ocular: Sclerae unicteric, pupils equal, round and reactive to light Ear-nose-throat: Oropharynx clear, dentition fair Lymphatic: No cervical or supraclavicular adenopathy Lungs no rales or rhonchi, good excursion bilaterally Heart regular rate and rhythm, no murmur appreciated Abd soft, nontender, positive bowel sounds MSK no focal spinal tenderness, no joint edema Neuro: non-focal, well-oriented, appropriate  affect Breasts: Deferred   Lab Results  Component Value Date   WBC 6.4 07/15/2020   HGB 11.6 (L) 07/15/2020   HCT 36.5 07/15/2020   MCV 86.1 07/15/2020   PLT 254 07/15/2020   Lab Results  Component Value Date   FERRITIN 47 04/14/2020   IRON 63 04/14/2020   TIBC 369 04/14/2020   UIBC 306 04/14/2020   IRONPCTSAT 17 (L) 04/14/2020   Lab Results  Component Value Date   RETICCTPCT 1.2 07/15/2020   RBC 4.20 07/15/2020   No results found for: KPAFRELGTCHN, LAMBDASER, KAPLAMBRATIO No results found for: IGGSERUM, IGA, IGMSERUM No results found for: Dorene Ar, A1GS, A2GS, Karn Pickler,  SPEI   Chemistry      Component Value Date/Time   NA 137 07/15/2020 1425   K 3.5 07/15/2020 1425   CL 102 07/15/2020 1425   CO2 26 07/15/2020 1425   BUN 14 07/15/2020 1425   CREATININE 0.85 07/15/2020 1425      Component Value Date/Time   CALCIUM 9.9 07/15/2020 1425   ALKPHOS 101 07/15/2020 1425   AST 10 (L) 07/15/2020 1425   ALT 9 07/15/2020 1425   BILITOT 0.3 07/15/2020 1425       Impression and Plan: Laura Shah is a very pleasant 40 yo African-American female iron deficiency anemia secondary to heavy cycles and malabsorption post gastric sleeve surgery. Iron studies and B 12 are pending. We will replace if needed.  Follow-up in 3 months.  She can contact our office with any questions or concerns.   Emeline Gins, NP 6/17/20223:14 PM

## 2020-07-15 NOTE — Telephone Encounter (Signed)
Per 07/15/20 los - called patient and gave upcoming appointments - patient confirmed 

## 2020-07-18 ENCOUNTER — Telehealth: Payer: Self-pay

## 2020-07-18 ENCOUNTER — Other Ambulatory Visit: Payer: Self-pay | Admitting: Family

## 2020-07-18 ENCOUNTER — Telehealth: Payer: Self-pay | Admitting: *Deleted

## 2020-07-18 NOTE — Telephone Encounter (Signed)
Spoke with pt to advise her of lab results. Pt inquired about if she could receive a B12 injection. Per Maralyn Sago- orders are placed and scheduling will contact her for apt. Pt stated understanding and had no further concerns or questions.

## 2020-07-18 NOTE — Telephone Encounter (Signed)
-----   Message from Verdie Mosher, NP sent at 07/17/2020  9:52 PM EDT ----- Her B 12 level is on the low end of normal. She can start taking a sublingual B12 supplement daily. We will recheck her level at her next follow-up. I also sent a message to scheduling to get her set up for IV iron. Thank you!   ----- Message ----- From: [facility] Sent: 07/15/2020   6:59 PM EDT To: Verdie Mosher, NP

## 2020-07-18 NOTE — Telephone Encounter (Signed)
Called and informed patient of lab results, patient verbalized understanding and denies any questions or concerns at this time.  Iron apt scheduled for 07/22/20.

## 2020-07-18 NOTE — Telephone Encounter (Signed)
Pt.notified

## 2020-07-18 NOTE — Telephone Encounter (Signed)
-----   Message from Josph Macho, MD sent at 07/18/2020  3:38 PM EDT ----- Please call and tell her that the iron level is quite low.  She needs IV iron.  Please set this up.  Thanks.  Cindee Lame

## 2020-07-22 ENCOUNTER — Inpatient Hospital Stay: Payer: BC Managed Care – PPO

## 2020-07-22 ENCOUNTER — Other Ambulatory Visit: Payer: Self-pay

## 2020-07-22 VITALS — BP 107/54 | HR 81 | Temp 98.0°F | Resp 16

## 2020-07-22 DIAGNOSIS — D500 Iron deficiency anemia secondary to blood loss (chronic): Secondary | ICD-10-CM | POA: Diagnosis not present

## 2020-07-22 DIAGNOSIS — K909 Intestinal malabsorption, unspecified: Secondary | ICD-10-CM

## 2020-07-22 MED ORDER — CYANOCOBALAMIN 1000 MCG/ML IJ SOLN
1000.0000 ug | Freq: Once | INTRAMUSCULAR | Status: AC
  Administered 2020-07-22: 1000 ug via INTRAMUSCULAR

## 2020-07-22 MED ORDER — CYANOCOBALAMIN 1000 MCG/ML IJ SOLN
INTRAMUSCULAR | Status: AC
  Filled 2020-07-22: qty 1

## 2020-07-22 MED ORDER — SODIUM CHLORIDE 0.9 % IV SOLN
Freq: Once | INTRAVENOUS | Status: AC
  Filled 2020-07-22: qty 250

## 2020-07-22 MED ORDER — SODIUM CHLORIDE 0.9 % IV SOLN
125.0000 mg | Freq: Once | INTRAVENOUS | Status: AC
  Administered 2020-07-22: 125 mg via INTRAVENOUS
  Filled 2020-07-22: qty 125

## 2020-07-22 NOTE — Patient Instructions (Signed)
Sodium Ferric Gluconate Complex injection What is this medication? SODIUM FERRIC GLUCONATE COMPLEX (SOE dee um FER ik GLOO koe nate KOM pleks) is an iron replacement. It is used with epoetin therapy to treat low iron levelsin patients who are receiving hemodialysis. This medicine may be used for other purposes; ask your health care provider orpharmacist if you have questions. COMMON BRAND NAME(S): Ferrlecit, Nulecit What should I tell my care team before I take this medication? They need to know if you have any of the following conditions: anemia that is not from iron deficiency high levels of iron in the body an unusual or allergic reaction to iron, benzyl alcohol, other medicines, foods, dyes, or preservatives pregnant or are trying to become pregnant breast-feeding How should I use this medication? This medicine is for infusion into a vein. It is given by a health careprofessional in a hospital or clinic setting. Talk to your pediatrician regarding the use of this medicine in children. While this drug may be prescribed for children as young as 6 years old for selectedconditions, precautions do apply. Overdosage: If you think you have taken too much of this medicine contact apoison control center or emergency room at once. NOTE: This medicine is only for you. Do not share this medicine with others. What if I miss a dose? It is important not to miss your dose. Call your doctor or health careprofessional if you are unable to keep an appointment. What may interact with this medication? Do not take this medicine with any of the following medications: deferoxamine dimercaprol other iron products This medicine may also interact with the following medications: chloramphenicol deferasirox medicine for blood pressure like enalapril This list may not describe all possible interactions. Give your health care provider a list of all the medicines, herbs, non-prescription drugs, or dietary  supplements you use. Also tell them if you smoke, drink alcohol, or use illegaldrugs. Some items may interact with your medicine. What should I watch for while using this medication? Your condition will be monitored carefully while you are receiving thismedicine. Visit your doctor for check-ups as directed. What side effects may I notice from receiving this medication? Side effects that you should report to your doctor or health care professionalas soon as possible: allergic reactions like skin rash, itching or hives, swelling of the face, lips, or tongue breathing problems changes in hearing changes in vision chills, flushing, or sweating fast, irregular heartbeat feeling faint or lightheaded, falls fever, flu-like symptoms high or low blood pressure pain, tingling, numbness in the hands or feet severe pain in the chest, back, flanks, or groin swelling of the ankles, feet, hands trouble passing urine or change in the amount of urine unusually weak or tired Side effects that usually do not require medical attention (report to yourdoctor or health care professional if they continue or are bothersome): cramps dark colored stools diarrhea headache nausea, vomiting stomach upset This list may not describe all possible side effects. Call your doctor for medical advice about side effects. You may report side effects to FDA at1-800-FDA-1088. Where should I keep my medication? This drug is given in a hospital or clinic and will not be stored at home. NOTE: This sheet is a summary. It may not cover all possible information. If you have questions about this medicine, talk to your doctor, pharmacist, orhealth care provider.  2022 Elsevier/Gold Standard (2007-09-17 15:58:57)  

## 2020-07-29 ENCOUNTER — Inpatient Hospital Stay: Payer: BC Managed Care – PPO | Attending: Hematology & Oncology

## 2020-07-29 ENCOUNTER — Other Ambulatory Visit: Payer: Self-pay

## 2020-07-29 VITALS — BP 107/51 | HR 81 | Temp 98.0°F | Resp 17

## 2020-07-29 DIAGNOSIS — D500 Iron deficiency anemia secondary to blood loss (chronic): Secondary | ICD-10-CM

## 2020-07-29 DIAGNOSIS — K909 Intestinal malabsorption, unspecified: Secondary | ICD-10-CM | POA: Diagnosis not present

## 2020-07-29 DIAGNOSIS — D509 Iron deficiency anemia, unspecified: Secondary | ICD-10-CM | POA: Diagnosis not present

## 2020-07-29 MED ORDER — SODIUM CHLORIDE 0.9 % IV SOLN
Freq: Once | INTRAVENOUS | Status: AC
  Filled 2020-07-29: qty 250

## 2020-07-29 MED ORDER — SODIUM CHLORIDE 0.9 % IV SOLN
125.0000 mg | Freq: Once | INTRAVENOUS | Status: AC
  Administered 2020-07-29: 125 mg via INTRAVENOUS
  Filled 2020-07-29: qty 125

## 2020-07-29 NOTE — Patient Instructions (Signed)
Sodium Ferric Gluconate Complex injection What is this medicine? SODIUM FERRIC GLUCONATE COMPLEX (SOE dee um FER ik GLOO koe nate KOM pleks) is an iron replacement. It is used with epoetin therapy to treat low iron levels in patients who are receiving hemodialysis. This medicine may be used for other purposes; ask your health care provider or pharmacist if you have questions. COMMON BRAND NAME(S): Ferrlecit, Nulecit What should I tell my health care provider before I take this medicine? They need to know if you have any of the following conditions:  anemia that is not from iron deficiency  high levels of iron in the body  an unusual or allergic reaction to iron, benzyl alcohol, other medicines, foods, dyes, or preservatives  pregnant or are trying to become pregnant  breast-feeding How should I use this medicine? This medicine is for infusion into a vein. It is given by a health care professional in a hospital or clinic setting. Talk to your pediatrician regarding the use of this medicine in children. While this drug may be prescribed for children as young as 6 years old for selected conditions, precautions do apply. Overdosage: If you think you have taken too much of this medicine contact a poison control center or emergency room at once. NOTE: This medicine is only for you. Do not share this medicine with others. What if I miss a dose? It is important not to miss your dose. Call your doctor or health care professional if you are unable to keep an appointment. What may interact with this medicine? Do not take this medicine with any of the following medications:  deferoxamine  dimercaprol  other iron products This medicine may also interact with the following medications:  chloramphenicol  deferasirox  medicine for blood pressure like enalapril This list may not describe all possible interactions. Give your health care provider a list of all the medicines, herbs,  non-prescription drugs, or dietary supplements you use. Also tell them if you smoke, drink alcohol, or use illegal drugs. Some items may interact with your medicine. What should I watch for while using this medicine? Your condition will be monitored carefully while you are receiving this medicine. Visit your doctor for check-ups as directed. What side effects may I notice from receiving this medicine? Side effects that you should report to your doctor or health care professional as soon as possible:  allergic reactions like skin rash, itching or hives, swelling of the face, lips, or tongue  breathing problems  changes in hearing  changes in vision  chills, flushing, or sweating  fast, irregular heartbeat  feeling faint or lightheaded, falls  fever, flu-like symptoms  high or low blood pressure  pain, tingling, numbness in the hands or feet  severe pain in the chest, back, flanks, or groin  swelling of the ankles, feet, hands  trouble passing urine or change in the amount of urine  unusually weak or tired Side effects that usually do not require medical attention (report to your doctor or health care professional if they continue or are bothersome):  cramps  dark colored stools  diarrhea  headache  nausea, vomiting  stomach upset This list may not describe all possible side effects. Call your doctor for medical advice about side effects. You may report side effects to FDA at 1-800-FDA-1088. Where should I keep my medicine? This drug is given in a hospital or clinic and will not be stored at home. NOTE: This sheet is a summary. It may not cover all   possible information. If you have questions about this medicine, talk to your doctor, pharmacist, or health care provider.  2021 Elsevier/Gold Standard (2007-09-17 15:58:57)   

## 2020-10-02 ENCOUNTER — Encounter (HOSPITAL_BASED_OUTPATIENT_CLINIC_OR_DEPARTMENT_OTHER): Payer: Self-pay | Admitting: Emergency Medicine

## 2020-10-02 ENCOUNTER — Emergency Department (HOSPITAL_BASED_OUTPATIENT_CLINIC_OR_DEPARTMENT_OTHER)
Admission: EM | Admit: 2020-10-02 | Discharge: 2020-10-02 | Disposition: A | Discharge status: Home / Self Care | Payer: BC Managed Care – PPO | Attending: Emergency Medicine | Admitting: Emergency Medicine

## 2020-10-02 DIAGNOSIS — R110 Nausea: Secondary | ICD-10-CM | POA: Insufficient documentation

## 2020-10-02 DIAGNOSIS — R42 Dizziness and giddiness: Secondary | ICD-10-CM | POA: Insufficient documentation

## 2020-10-02 DIAGNOSIS — R531 Weakness: Secondary | ICD-10-CM | POA: Diagnosis not present

## 2020-10-02 DIAGNOSIS — R109 Unspecified abdominal pain: Secondary | ICD-10-CM | POA: Insufficient documentation

## 2020-10-02 DIAGNOSIS — R4182 Altered mental status, unspecified: Secondary | ICD-10-CM | POA: Diagnosis not present

## 2020-10-02 LAB — URINALYSIS, ROUTINE W REFLEX MICROSCOPIC
Bilirubin Urine: NEGATIVE
Glucose, UA: NEGATIVE mg/dL
Ketones, ur: 40 mg/dL — AB
Leukocytes,Ua: NEGATIVE
Nitrite: NEGATIVE
Protein, ur: NEGATIVE mg/dL
Specific Gravity, Urine: 1.025 (ref 1.005–1.030)
pH: 6 (ref 5.0–8.0)

## 2020-10-02 LAB — CBC WITH DIFFERENTIAL/PLATELET
Abs Immature Granulocytes: 0.01 10*3/uL (ref 0.00–0.07)
Basophils Absolute: 0 10*3/uL (ref 0.0–0.1)
Basophils Relative: 1 %
Eosinophils Absolute: 0.1 10*3/uL (ref 0.0–0.5)
Eosinophils Relative: 2 %
HCT: 40.1 % (ref 36.0–46.0)
Hemoglobin: 13.1 g/dL (ref 12.0–15.0)
Immature Granulocytes: 0 %
Lymphocytes Relative: 31 %
Lymphs Abs: 1.5 10*3/uL (ref 0.7–4.0)
MCH: 27.9 pg (ref 26.0–34.0)
MCHC: 32.7 g/dL (ref 30.0–36.0)
MCV: 85.5 fL (ref 80.0–100.0)
Monocytes Absolute: 0.6 10*3/uL (ref 0.1–1.0)
Monocytes Relative: 12 %
Neutro Abs: 2.7 10*3/uL (ref 1.7–7.7)
Neutrophils Relative %: 54 %
Platelets: 265 10*3/uL (ref 150–400)
RBC: 4.69 MIL/uL (ref 3.87–5.11)
RDW: 14.5 % (ref 11.5–15.5)
WBC: 4.9 10*3/uL (ref 4.0–10.5)
nRBC: 0 % (ref 0.0–0.2)

## 2020-10-02 LAB — COMPREHENSIVE METABOLIC PANEL
ALT: 11 U/L (ref 0–44)
AST: 15 U/L (ref 15–41)
Albumin: 4.2 g/dL (ref 3.5–5.0)
Alkaline Phosphatase: 94 U/L (ref 38–126)
Anion gap: 8 (ref 5–15)
BUN: 12 mg/dL (ref 6–20)
CO2: 26 mmol/L (ref 22–32)
Calcium: 9.5 mg/dL (ref 8.9–10.3)
Chloride: 105 mmol/L (ref 98–111)
Creatinine, Ser: 0.83 mg/dL (ref 0.44–1.00)
GFR, Estimated: >60 mL/min (ref >60)
Glucose, Bld: 88 mg/dL (ref 70–99)
Potassium: 3.7 mmol/L (ref 3.5–5.1)
Sodium: 139 mmol/L (ref 135–145)
Total Bilirubin: 0.7 mg/dL (ref 0.3–1.2)
Total Protein: 8.1 g/dL (ref 6.5–8.1)

## 2020-10-02 LAB — URINALYSIS, MICROSCOPIC (REFLEX)

## 2020-10-02 LAB — ETHANOL: Alcohol, Ethyl (B): <10 mg/dL (ref <10)

## 2020-10-02 LAB — WET PREP, GENITAL
Clue Cells Wet Prep HPF POC: NONE SEEN
Sperm: NONE SEEN
Trich, Wet Prep: NONE SEEN
Yeast Wet Prep HPF POC: NONE SEEN

## 2020-10-02 LAB — LIPASE, BLOOD: Lipase: 45 U/L (ref 11–51)

## 2020-10-02 LAB — RAPID URINE DRUG SCREEN, HOSP PERFORMED
Amphetamines: NOT DETECTED
Barbiturates: NOT DETECTED
Benzodiazepines: NOT DETECTED
Cocaine: NOT DETECTED
Opiates: NOT DETECTED
Tetrahydrocannabinol: NOT DETECTED

## 2020-10-02 LAB — MAGNESIUM: Magnesium: 2 mg/dL (ref 1.7–2.4)

## 2020-10-02 LAB — SALICYLATE LEVEL: Salicylate Lvl: <7 mg/dL — ABNORMAL LOW (ref 7.0–30.0)

## 2020-10-02 LAB — HCG, SERUM, QUALITATIVE: Preg, Serum: NEGATIVE

## 2020-10-02 LAB — PHOSPHORUS: Phosphorus: 2.5 mg/dL (ref 2.5–4.6)

## 2020-10-02 MED ORDER — SODIUM CHLORIDE 0.9 % IV BOLUS
1000.0000 mL | Freq: Once | INTRAVENOUS | Status: AC
  Administered 2020-10-02: 1000 mL via INTRAVENOUS

## 2020-10-02 MED ORDER — ACETAMINOPHEN 325 MG PO TABS
650.0000 mg | ORAL_TABLET | Freq: Once | ORAL | Status: AC
  Administered 2020-10-02: 650 mg via ORAL
  Filled 2020-10-02: qty 2

## 2020-10-02 MED ORDER — ONDANSETRON HCL 4 MG/2ML IJ SOLN
4.0000 mg | Freq: Once | INTRAMUSCULAR | Status: AC
  Administered 2020-10-02: 4 mg via INTRAVENOUS
  Filled 2020-10-02: qty 2
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
## 2020-10-02 NOTE — ED Triage Notes (Signed)
Pt to EDw/ friend; friend sts pt was "having breathing problems" at 0300; pt nonverbal at beginning of triage, then said, "I feel slow"; had some ETOH and an "old oxy" last pm

## 2020-10-02 NOTE — Discharge Instructions (Addendum)
We recommend that you hydrate adequately, discontinue your BuSpar temporarily, do not take any oxycodone for the time being.  Please follow-up at your earliest convenience with your primary care doctor.  If your symptoms worsen or fail to improve despite rest and hydration, please return for further evaluation.
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
## 2020-10-02 NOTE — ED Notes (Signed)
ED Provider at bedside. Dr. Pfeiffer 

## 2020-10-04 LAB — GC/CHLAMYDIA PROBE AMP (~~LOC~~) NOT AT ARMC
Chlamydia: NEGATIVE
Comment: NEGATIVE
Comment: NORMAL
Neisseria Gonorrhea: NEGATIVE

## 2020-10-17 ENCOUNTER — Other Ambulatory Visit: Payer: Self-pay

## 2020-10-17 ENCOUNTER — Inpatient Hospital Stay: Payer: BC Managed Care – PPO | Attending: Hematology & Oncology

## 2020-10-17 ENCOUNTER — Encounter: Payer: Self-pay | Admitting: Hematology & Oncology

## 2020-10-17 ENCOUNTER — Inpatient Hospital Stay (HOSPITAL_BASED_OUTPATIENT_CLINIC_OR_DEPARTMENT_OTHER): Payer: BC Managed Care – PPO | Admitting: Hematology & Oncology

## 2020-10-17 VITALS — BP 123/76 | HR 92 | Temp 98.5°F | Resp 20 | Wt 150.0 lb

## 2020-10-17 DIAGNOSIS — D560 Alpha thalassemia: Secondary | ICD-10-CM | POA: Diagnosis not present

## 2020-10-17 DIAGNOSIS — Z8719 Personal history of other diseases of the digestive system: Secondary | ICD-10-CM | POA: Diagnosis not present

## 2020-10-17 DIAGNOSIS — G629 Polyneuropathy, unspecified: Secondary | ICD-10-CM | POA: Insufficient documentation

## 2020-10-17 DIAGNOSIS — D500 Iron deficiency anemia secondary to blood loss (chronic): Secondary | ICD-10-CM | POA: Insufficient documentation

## 2020-10-17 DIAGNOSIS — K851 Biliary acute pancreatitis without necrosis or infection: Secondary | ICD-10-CM

## 2020-10-17 DIAGNOSIS — K909 Intestinal malabsorption, unspecified: Secondary | ICD-10-CM

## 2020-10-17 DIAGNOSIS — N920 Excessive and frequent menstruation with regular cycle: Secondary | ICD-10-CM | POA: Insufficient documentation

## 2020-10-17 DIAGNOSIS — E538 Deficiency of other specified B group vitamins: Secondary | ICD-10-CM

## 2020-10-17 DIAGNOSIS — D510 Vitamin B12 deficiency anemia due to intrinsic factor deficiency: Secondary | ICD-10-CM

## 2020-10-17 DIAGNOSIS — Z9884 Bariatric surgery status: Secondary | ICD-10-CM | POA: Diagnosis not present

## 2020-10-17 LAB — CMP (CANCER CENTER ONLY)
ALT: 8 U/L (ref 0–44)
AST: 12 U/L — ABNORMAL LOW (ref 15–41)
Albumin: 4.3 g/dL (ref 3.5–5.0)
Alkaline Phosphatase: 86 U/L (ref 38–126)
Anion gap: 8 (ref 5–15)
BUN: 13 mg/dL (ref 6–20)
CO2: 26 mmol/L (ref 22–32)
Calcium: 9.6 mg/dL (ref 8.9–10.3)
Chloride: 104 mmol/L (ref 98–111)
Creatinine: 0.96 mg/dL (ref 0.44–1.00)
GFR, Estimated: >60 mL/min (ref >60)
Glucose, Bld: 90 mg/dL (ref 70–99)
Potassium: 3.9 mmol/L (ref 3.5–5.1)
Sodium: 138 mmol/L (ref 135–145)
Total Bilirubin: 0.4 mg/dL (ref 0.3–1.2)
Total Protein: 7.4 g/dL (ref 6.5–8.1)

## 2020-10-17 LAB — CBC WITH DIFFERENTIAL (CANCER CENTER ONLY)
Abs Immature Granulocytes: 0.02 10*3/uL (ref 0.00–0.07)
Basophils Absolute: 0 10*3/uL (ref 0.0–0.1)
Basophils Relative: 1 %
Eosinophils Absolute: 0.1 10*3/uL (ref 0.0–0.5)
Eosinophils Relative: 1 %
HCT: 38.4 % (ref 36.0–46.0)
Hemoglobin: 12.1 g/dL (ref 12.0–15.0)
Immature Granulocytes: 0 %
Lymphocytes Relative: 27 %
Lymphs Abs: 1.7 10*3/uL (ref 0.7–4.0)
MCH: 27.3 pg (ref 26.0–34.0)
MCHC: 31.5 g/dL (ref 30.0–36.0)
MCV: 86.5 fL (ref 80.0–100.0)
Monocytes Absolute: 0.6 10*3/uL (ref 0.1–1.0)
Monocytes Relative: 9 %
Neutro Abs: 4 10*3/uL (ref 1.7–7.7)
Neutrophils Relative %: 62 %
Platelet Count: 238 10*3/uL (ref 150–400)
RBC: 4.44 MIL/uL (ref 3.87–5.11)
RDW: 14.1 % (ref 11.5–15.5)
WBC Count: 6.4 10*3/uL (ref 4.0–10.5)
nRBC: 0 % (ref 0.0–0.2)

## 2020-10-17 LAB — VITAMIN B12: Vitamin B-12: 199 pg/mL (ref 180–914)

## 2020-10-17 LAB — RETICULOCYTES
Immature Retic Fract: 3.5 % (ref 2.3–15.9)
RBC.: 4.46 MIL/uL (ref 3.87–5.11)
Retic Count, Absolute: 39.7 10*3/uL (ref 19.0–186.0)
Retic Ct Pct: 0.9 % (ref 0.4–3.1)

## 2020-10-17 NOTE — Progress Notes (Signed)
Hematology and Oncology Follow Up Visit  Laura Shah [id number] 1980/11/26 40 y.o. 10/17/2020   Principle Diagnosis:  Iron deficiency anemia secondary to heavy cycles and malabsorption secndary to gasric sleeve (December 2016) Alpha thalassemia    Current Therapy:        IV iron as indicated  --Ferrlecit given on 07/29/2020 Folic acid 1 mg PO daily   Interim History:  Laura Shah is here today for follow-up.  She is having more however abdominal pain.  This is epigastric in location.  It does not radiate.  It is a burning type of pain.  She has had problems with pancreatitis in the past.  I just wonder if she may have chronic pancreatitis.  I think it would be worthwhile to do a CT scan to look at the pancreas to see if there is calcifications in the pancreas.  She has had constipation issues.  This is chronic.  She still has her monthly cycles.  There is somewhat heavy.  She had iron back in July.  At that time, her ferritin was 38 with iron saturation of only 7%.  She is a third grade school teacher.  She is quite busy at school.  She does have some neuropathy.  She has this tingling in the hands and feet.  I am not sure what the cause of this might be.  I think we have checked vitamin B12 levels on her.  Back in June, her B12 level was 216.  She has had no problems with COVID.  Overall, I would say her performance status is probably ECOG 1.    Medications:  Allergies as of 10/17/2020       Reactions   Metoclopramide Anxiety, Other (See Comments)   Morphine And Related Itching   Morphine Sulfate Other (See Comments)   Other Other (See Comments)        Medication List        Accurate as of October 17, 2020  3:59 PM. If you have any questions, ask your nurse or doctor.          STOP taking these medications    celecoxib 200 MG capsule Commonly known as: CELEBREX Stopped by: Josph Macho, MD   DULoxetine 20 MG capsule Commonly known as:  CYMBALTA Stopped by: Josph Macho, MD   hydrOXYzine 25 MG capsule Commonly known as: VISTARIL Stopped by: Josph Macho, MD       TAKE these medications    buPROPion 75 MG tablet Commonly known as: WELLBUTRIN Take 75 mg by mouth daily at 6 (six) AM.   busPIRone 5 MG tablet Commonly known as: BUSPAR Take 5 mg by mouth 2 (two) times daily.   folic acid 1 MG tablet Commonly known as: FOLVITE Take 1 tablet (1 mg total) by mouth daily.   pantoprazole 40 MG tablet Commonly known as: PROTONIX Take 1 tablet (40 mg total) by mouth daily.   traZODone 50 MG tablet Commonly known as: DESYREL Take 25-50 mg by mouth at bedtime as needed.        Allergies:  Allergies  Allergen Reactions   Metoclopramide Anxiety and Other (See Comments)   Morphine And Related Itching   Morphine Sulfate Other (See Comments)   Other Other (See Comments)    Past Medical History, Surgical history, Social history, and Family History were reviewed and updated.  Review of Systems: Review of Systems  Constitutional:  Positive for malaise/fatigue.  HENT: Negative.    Eyes: Negative.  Respiratory: Negative.    Cardiovascular: Negative.   Gastrointestinal:  Positive for abdominal pain.  Genitourinary: Negative.   Musculoskeletal:  Positive for back pain.  Skin: Negative.   Neurological:  Positive for tingling.  Endo/Heme/Allergies: Negative.   Psychiatric/Behavioral: Negative.      Physical Exam:  weight is 150 lb (68 kg). Her oral temperature is 98.5 F (36.9 C). Her blood pressure is 123/76 and her pulse is 92. Her respiration is 20 and oxygen saturation is 100%.   Wt Readings from Last 3 Encounters:  10/17/20 150 lb (68 kg)  10/02/20 150 lb (68 kg)  07/15/20 174 lb (78.9 kg)    Physical Exam Vitals reviewed.  HENT:     Head: Normocephalic and atraumatic.  Eyes:     Pupils: Pupils are equal, round, and reactive to light.  Cardiovascular:     Rate and Rhythm: Normal rate  and regular rhythm.     Heart sounds: Normal heart sounds.  Pulmonary:     Effort: Pulmonary effort is normal.     Breath sounds: Normal breath sounds.  Abdominal:     General: Bowel sounds are normal.     Palpations: Abdomen is soft.     Comments: Abdominal exam shows a soft abdomen.  Bowel sounds are slightly decreased.  There is tenderness throughout the abdomen.  There are some tenderness to palpation.  There is no guarding.  There is no palpable fluid wave.  There is no palpable liver or spleen tip.  Musculoskeletal:        General: No tenderness or deformity. Normal range of motion.     Cervical back: Normal range of motion.  Lymphadenopathy:     Cervical: No cervical adenopathy.  Skin:    General: Skin is warm and dry.     Findings: No erythema or rash.  Neurological:     Mental Status: She is alert and oriented to person, place, and time.  Psychiatric:        Behavior: Behavior normal.        Thought Content: Thought content normal.        Judgment: Judgment normal.    Lab Results  Component Value Date   WBC 6.4 10/17/2020   HGB 12.1 10/17/2020   HCT 38.4 10/17/2020   MCV 86.5 10/17/2020   PLT 238 10/17/2020   Lab Results  Component Value Date   FERRITIN 38 07/15/2020   IRON 31 07/15/2020   TIBC 433 07/15/2020   UIBC 402 07/15/2020   IRONPCTSAT 7 (L) 07/15/2020   Lab Results  Component Value Date   RETICCTPCT 0.9 10/17/2020   RBC 4.44 10/17/2020   RBC 4.46 10/17/2020   No results found for: KPAFRELGTCHN, LAMBDASER, KAPLAMBRATIO No results found for: IGGSERUM, IGA, IGMSERUM No results found for: Marda Stalker, SPEI   Chemistry      Component Value Date/Time   NA 138 10/17/2020 1506   K 3.9 10/17/2020 1506   CL 104 10/17/2020 1506   CO2 26 10/17/2020 1506   BUN 13 10/17/2020 1506   CREATININE 0.96 10/17/2020 1506      Component Value Date/Time   CALCIUM 9.6 10/17/2020 1506   ALKPHOS 86 10/17/2020  1506   AST 12 (L) 10/17/2020 1506   ALT 8 10/17/2020 1506   BILITOT 0.4 10/17/2020 1506       Impression and Plan: Laura Shah is a very pleasant 40 yo African-American female iron deficiency anemia secondary to heavy cycles  and malabsorption post gastric sleeve surgery.  We will see what her iron studies show.  Her MCV is not all that bad.  I think we also will be checking her B12 levels.  We will get a CT of her abdomen.  I want to make sure that we get the CT scan so we can check her pancreas make sure there is nothing going on there.  I would like to see her back in November before the holiday season.   Josph Macho, MD 9/19/20223:59 PM

## 2020-10-18 ENCOUNTER — Telehealth: Payer: Self-pay

## 2020-10-18 LAB — IRON AND TIBC
Iron: 75 ug/dL (ref 41–142)
Saturation Ratios: 21 % (ref 21–57)
TIBC: 363 ug/dL (ref 236–444)
UIBC: 287 ug/dL (ref 120–384)

## 2020-10-18 LAB — FERRITIN: Ferritin: 72 ng/mL (ref 11–307)

## 2020-10-18 NOTE — Telephone Encounter (Signed)
Called pt and she is awrare of her appts per 10/17/20 los  [employer]

## 2020-10-24 ENCOUNTER — Encounter (HOSPITAL_BASED_OUTPATIENT_CLINIC_OR_DEPARTMENT_OTHER): Payer: Self-pay

## 2020-10-24 ENCOUNTER — Ambulatory Visit (HOSPITAL_BASED_OUTPATIENT_CLINIC_OR_DEPARTMENT_OTHER)
Admission: RE | Admit: 2020-10-24 | Discharge: 2020-10-24 | Disposition: A | Discharge status: Home / Self Care | Payer: BC Managed Care – PPO | Source: Ambulatory Visit | Attending: Hematology & Oncology | Admitting: Hematology & Oncology

## 2020-10-24 ENCOUNTER — Other Ambulatory Visit: Payer: Self-pay

## 2020-10-24 DIAGNOSIS — K851 Biliary acute pancreatitis without necrosis or infection: Secondary | ICD-10-CM | POA: Diagnosis not present

## 2020-10-24 MED ORDER — IOHEXOL 350 MG/ML SOLN
100.0000 mL | Freq: Once | INTRAVENOUS | Status: AC | PRN
  Administered 2020-10-24: 85 mL via INTRAVENOUS

## 2020-11-11 ENCOUNTER — Other Ambulatory Visit: Payer: Self-pay

## 2020-11-11 ENCOUNTER — Emergency Department (HOSPITAL_COMMUNITY)
Admission: EM | Admit: 2020-11-11 | Discharge: 2020-11-11 | Disposition: A | Discharge status: Home / Self Care | Payer: BC Managed Care – PPO | Attending: Emergency Medicine | Admitting: Emergency Medicine

## 2020-11-11 ENCOUNTER — Emergency Department (HOSPITAL_COMMUNITY): Payer: BC Managed Care – PPO

## 2020-11-11 ENCOUNTER — Emergency Department (HOSPITAL_BASED_OUTPATIENT_CLINIC_OR_DEPARTMENT_OTHER)
Admission: EM | Admit: 2020-11-11 | Discharge: 2020-11-11 | Disposition: A | Discharge status: Home / Self Care | Payer: BC Managed Care – PPO

## 2020-11-11 DIAGNOSIS — M5126 Other intervertebral disc displacement, lumbar region: Secondary | ICD-10-CM | POA: Diagnosis not present

## 2020-11-11 DIAGNOSIS — M545 Low back pain, unspecified: Secondary | ICD-10-CM | POA: Diagnosis present

## 2020-11-11 LAB — CBC WITH DIFFERENTIAL/PLATELET
Abs Immature Granulocytes: 0.03 10*3/uL (ref 0.00–0.07)
Basophils Absolute: 0 10*3/uL (ref 0.0–0.1)
Basophils Relative: 0 %
Eosinophils Absolute: 0 10*3/uL (ref 0.0–0.5)
Eosinophils Relative: 0 %
HCT: 39 % (ref 36.0–46.0)
Hemoglobin: 12.5 g/dL (ref 12.0–15.0)
Immature Granulocytes: 0 %
Lymphocytes Relative: 16 %
Lymphs Abs: 1.4 10*3/uL (ref 0.7–4.0)
MCH: 27.5 pg (ref 26.0–34.0)
MCHC: 32.1 g/dL (ref 30.0–36.0)
MCV: 85.7 fL (ref 80.0–100.0)
Monocytes Absolute: 0.5 10*3/uL (ref 0.1–1.0)
Monocytes Relative: 6 %
Neutro Abs: 6.7 10*3/uL (ref 1.7–7.7)
Neutrophils Relative %: 78 %
Platelets: 257 10*3/uL (ref 150–400)
RBC: 4.55 MIL/uL (ref 3.87–5.11)
RDW: 14.1 % (ref 11.5–15.5)
WBC: 8.7 10*3/uL (ref 4.0–10.5)
nRBC: 0 % (ref 0.0–0.2)

## 2020-11-11 LAB — BASIC METABOLIC PANEL
Anion gap: 9 (ref 5–15)
BUN: 9 mg/dL (ref 6–20)
CO2: 23 mmol/L (ref 22–32)
Calcium: 9.2 mg/dL (ref 8.9–10.3)
Chloride: 104 mmol/L (ref 98–111)
Creatinine, Ser: 0.73 mg/dL (ref 0.44–1.00)
GFR, Estimated: >60 mL/min (ref >60)
Glucose, Bld: 114 mg/dL — ABNORMAL HIGH (ref 70–99)
Potassium: 3.6 mmol/L (ref 3.5–5.1)
Sodium: 136 mmol/L (ref 135–145)

## 2020-11-11 MED ORDER — HYDROMORPHONE HCL 1 MG/ML IJ SOLN
1.0000 mg | Freq: Once | INTRAMUSCULAR | Status: AC
  Administered 2020-11-11: 1 mg via INTRAVENOUS
  Filled 2020-11-11: qty 1

## 2020-11-11 MED ORDER — MORPHINE SULFATE (PF) 4 MG/ML IV SOLN
4.0000 mg | Freq: Once | INTRAVENOUS | Status: DC
Start: 2020-11-11 — End: 2020-11-11
  Filled 2020-11-11: qty 1

## 2020-11-11 MED ORDER — GADOBUTROL 1 MMOL/ML IV SOLN
7.0000 mL | Freq: Once | INTRAVENOUS | Status: AC | PRN
  Administered 2020-11-11: 7 mL via INTRAVENOUS

## 2020-11-11 MED ORDER — DIPHENHYDRAMINE HCL 50 MG/ML IJ SOLN
25.0000 mg | Freq: Once | INTRAMUSCULAR | Status: DC
  Filled 2020-11-11: qty 1

## 2020-11-11 NOTE — Discharge Instructions (Addendum)
You came to the emergency department to be evaluated for your back pain.  Your physical exam is reassuring.  The MRI of your lumbar back showed a small central disc protrusion at L5-S1, slightly increased. No spinal canal or neural foraminal stenosis.  Please follow-up with your neurosurgeon Dr. Danielle Dess.  Please continue to take your prescribed prednisone and hydrocodone.  Get help right away if: You cannot move your arms or legs. You cannot control when you urinate or have bowel movements. You feel dizzy or you faint. You have shortness of breath.

## 2020-11-11 NOTE — ED Provider Notes (Signed)
Emergency Medicine Provider Triage Evaluation Note  KELE WITHEM , a 40 y.o. female  was evaluated in triage.  Pt complains of back pain that began a couple of days ago.  Patient has history of herniated disc in her lumbar spine 4 years ago.  Required a spinal fusion in the past.  Review of Systems  Positive: Back pain, leg pain Negative: Bowel/bowel dysfunction.  Physical Exam  BP 119/88 (BP Location: Left Arm)   Pulse 88   Temp 98.1 F (36.7 C) (Oral)   Resp (!) 116   Ht 5\' 2"  (1.575 m)   Wt 65.8 kg   LMP 11/04/2020   SpO2 100%   BMI 26.52 kg/m  Gen:   Awake, no distress   Resp:  Normal effort  MSK:   Moves extremities without difficulty  Other:  Tenderness to palpation over vertebrae of lower thoracic and lumbar spine.  Medical Decision Making  Medically screening exam initiated at 6:37 PM.  Appropriate orders placed.  CARALYN TWINING was informed that the remainder of the evaluation will be completed by another provider, this initial triage assessment does not replace that evaluation, and the importance of remaining in the ED until their evaluation is complete.  Sent by previous orthopedist for MRI if her pain worsened.  She is unable to get in with their office for the next few weeks.   Foye Deer, PA-C 11/11/20 1839    11/13/20, MD 11/14/20 1459

## 2020-11-11 NOTE — ED Notes (Signed)
Patient transported to MRI 

## 2020-11-11 NOTE — ED Triage Notes (Signed)
Pt has hx of herniated disk and fusion L4-5. Pt states she saw surgeon this week and had follow up MRI scheduled. Pt c/o back pain, in bilateral legs and right arm. Back pain started after sneezing. States no issues with bowel/bladder control.

## 2020-11-11 NOTE — ED Provider Notes (Signed)
[facility]-EMERGENCY DEPT Provider Note   CSN: [id number] Arrival date & time: 11/11/20  1809     History Chief Complaint  Patient presents with   Back Pain    Laura Shah is a 40 y.o. female lumbar herniated disc, anterior lumbar fusion 2021 by Dr. Danielle Dess, iron deficiency anemia, Heart valve regurgitation.  Patient presents emergency department with a chief complaint of lumbar back pain.  Patient reports the pain has been present over the last 6 weeks.  Pain has gotten progressively worse over this time.  Patient reports apparently she had a sudden increase in her pain after an episode of sneezing.  Patient states that "my back went out my legs became numb."  At present patient rates pain 10/10 on the pain scale.  Pain has been constant.  Pain is worse with movement, touch, or sitting.  Patient has had no relief with prescribed hydrocodone.  Patient denies any bowel or bladder dysfunction, saddle anesthesia, weakness, fever, chills.   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 Ruptured.   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   Palpitations 10/27/2018   Fatigue 10/27/2018   Shortness of breath 10/27/2018   GERD (gastroesophageal reflux disease) 10/11/2018   Cellulitis 10/10/2018   Herniated nucleus pulposus, L4-5 right 10/02/2018   Herniated nucleus pulposus, L4-5 10/02/2018   Right leg weakness    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   Constipation by delayed colonic transit    Acute pancreatitis 03/16/2015   OSA (obstructive sleep apnea) 03/16/2015   Anemia 03/16/2015   Hypokalemia 03/16/2015   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   Epigastric abdominal pain    RUQ pain    Elevated LFTs    Irritable bowel syndrome with constipation    Injury of hand, right 08/03/2014   Interstitial cystitis    Tachycardia     Past Surgical History:  Procedure Laterality Date   ABDOMINAL EXPOSURE N/A 10/02/2018   Procedure: ABDOMINAL EXPOSURE;  Surgeon: Larina Earthly, MD;  Location: [facility] OR;  Service: Vascular;  Laterality: N/A;   ANTERIOR LUMBAR FUSION N/A 10/02/2018   Procedure: Lumbar four-five Anterior lumbar interbody;  Surgeon: Barnett Abu, MD;  Location: [facility] OR;  Service: Neurosurgery;  Laterality: N/A;   APPENDECTOMY  BARIATRIC SURGERY     CESAREAN SECTION     CHOLECYSTECTOMY     ESOPHAGOGASTRODUODENOSCOPY (EGD) WITH PROPOFOL N/A 03/18/2015   Procedure: ESOPHAGOGASTRODUODENOSCOPY (EGD) WITH PROPOFOL;  Surgeon: Sherrilyn Rist, MD;  Location: [facility] ENDOSCOPY;  Service: Endoscopy;  Laterality: N/A;   GASTRECTOMY     laparoscopic sleeve gastrectomy 01/27/15   Leg Pain     LUMBAR DISC SURGERY     L4-5 discectomy 05/2017   OVARIAN CYST REMOVAL     Ruptured     OB History   No obstetric history on file.     Family History  Problem Relation Age of Onset   Breast cancer Maternal Grandmother     Breast cancer Paternal Grandmother    Diabetes Paternal Grandmother    Diabetes Father    Hypertension Father     Social History   Tobacco Use   Smoking status: Never   Smokeless tobacco: Never  Vaping Use   Vaping Use: Never used  Substance Use Topics   Alcohol use: Yes   Drug use: No    Home Medications Prior to Admission medications   Medication Sig Start Date End Date Taking? Authorizing Provider  buPROPion (WELLBUTRIN) 75 MG tablet Take 75 mg by mouth daily at 6 (six) AM. 06/08/20   [provider]  busPIRone (BUSPAR) 5 MG tablet Take 5 mg by mouth 2 (two) times daily. 10/04/20   [provider]  folic acid (FOLVITE) 1 MG tablet Take 1 tablet (1 mg total) by mouth daily. 04/14/20   Erenest Blank, NP  pantoprazole (PROTONIX) 40 MG tablet Take 1 tablet (40 mg total) by mouth daily. 08/28/14   Dione Booze, MD  traZODone (DESYREL) 50 MG tablet Take 25-50 mg by mouth at bedtime as needed. 06/08/20   [provider]    Allergies    Metoclopramide, Morphine and related, Morphine sulfate, and Other  Review of Systems   Review of Systems  Constitutional:  Negative for chills and fever.  Eyes:  Negative for visual disturbance.  Respiratory:  Negative for shortness of breath.   Cardiovascular:  Negative for chest pain.  Gastrointestinal:  Negative for abdominal pain, nausea and vomiting.  Genitourinary:  Negative for difficulty urinating and enuresis.  Musculoskeletal:  Positive for back pain. Negative for neck pain.  Skin:  Negative for color change and rash.  Neurological:  Positive for numbness. Negative for dizziness, syncope, weakness, light-headedness and headaches.  Psychiatric/Behavioral:  Negative for confusion.    Physical Exam Updated Vital Signs BP 119/88 (BP Location: Left Arm)   Pulse 88   Temp 98.1 F (36.7 C) (Oral)   Resp (!) 116   Ht 5\' 2"  (1.575 m)   Wt 65.8 kg   LMP 11/04/2020   SpO2 100%   BMI 26.52 kg/m   Physical  Exam Vitals and nursing note reviewed.  Constitutional:      General: She is not in acute distress.    Appearance: She is not ill-appearing, toxic-appearing or diaphoretic.  Eyes:     General: No scleral icterus.       Right eye: No discharge.        Left eye: No discharge.     Extraocular Movements: Extraocular movements intact.     Pupils: Pupils are equal, round, and reactive to light.  Cardiovascular:     Rate and Rhythm: Normal rate.  Pulmonary:     Effort: Pulmonary effort is normal.  Abdominal:     Palpations: Abdomen  is soft.     Tenderness: There is no abdominal tenderness.  Musculoskeletal:     Cervical back: Normal, normal range of motion and neck supple. No rigidity.     Thoracic back: No swelling, edema, deformity, signs of trauma, lacerations, spasms, tenderness or bony tenderness.     Lumbar back: No swelling, edema, deformity, signs of trauma, lacerations, spasms, tenderness or bony tenderness.     Comments: No midline tenderness or deformity to cervical, thoracic, or lumbar spine.  Patient has tenderness to bilateral lumbar paraspinous muscles.  Skin:    General: Skin is warm and dry.  Neurological:     General: No focal deficit present.     Mental Status: She is alert.     GCS: GCS eye subscore is 4. GCS verbal subscore is 5. GCS motor subscore is 6.     Cranial Nerves: No cranial nerve deficit or facial asymmetry.     Motor: No weakness, tremor or seizure activity.     Coordination: Romberg sign negative. Finger-Nose-Finger Test normal.     Gait: Gait is intact. Gait normal.     Comments: CN II-XII intact; performed in supine position due to back pain, +5 strength to bilateral upper extremities, +5 strength to dorsiflexion and plantarflexion, patient able to left both legs against gravity and hold each there without difficult.  Sensation to light touch intact to bilateral her lower extremities.  Patient able to stand and ambulate with assistance.  Patient able to  distinguish between sharp and dull touch throughout bilateral lower extremities.  Psychiatric:        Behavior: Behavior is cooperative.    ED Results / Procedures / Treatments   Labs (all labs ordered are listed, but only abnormal results are displayed) Labs Reviewed  BASIC METABOLIC PANEL - Abnormal; Notable for the following components:      Result Value   Glucose, Bld 114 (*)    All other components within normal limits  CBC WITH DIFFERENTIAL/PLATELET    EKG None  Radiology MR THORACIC SPINE W WO CONTRAST  Result Date: 11/11/2020 CLINICAL DATA:  Mid back pain EXAM: MRI THORACIC WITHOUT AND WITH CONTRAST TECHNIQUE: Multiplanar and multiecho pulse sequences of the thoracic spine were obtained without and with intravenous contrast. CONTRAST:  49mL GADAVIST GADOBUTROL 1 MMOL/ML IV SOLN COMPARISON:  None. FINDINGS: Alignment:  Physiologic. Vertebrae: No fracture, evidence of discitis, or bone lesion. Cord:  Normal signal and morphology. Paraspinal and other soft tissues: Negative. Disc levels: No spinal canal or neural foraminal stenosis. IMPRESSION: Normal thoracic spine MRI. Electronically Signed   By: Deatra Robinson M.D.   On: 11/11/2020 21:56   MR Lumbar Spine W Wo Contrast  Result Date: 11/11/2020 CLINICAL DATA:  Low back pain EXAM: MRI LUMBAR SPINE WITHOUT AND WITH CONTRAST TECHNIQUE: Multiplanar and multiecho pulse sequences of the lumbar spine were obtained without and with intravenous contrast. CONTRAST:  21mL GADAVIST GADOBUTROL 1 MMOL/ML IV SOLN COMPARISON:  06/26/2018 FINDINGS: Segmentation:  Standard. Alignment:  Physiologic. Vertebrae:  Anterior fusion at L4-5.  No acute abnormality. Conus medullaris and cauda equina: Conus extends to the L1 level. Conus and cauda equina appear normal. Paraspinal and other soft tissues: Negative. Disc levels: L1-L2: Normal disc space and facet joints. No spinal canal stenosis. No neural foraminal stenosis. L2-L3: Normal disc space and facet  joints. No spinal canal stenosis. No neural foraminal stenosis. L3-L4: Normal disc space and facet joints. No spinal canal stenosis. No neural foraminal stenosis. L4-L5: Anterior fusion. Status  post right laminectomy. Small amount of residual central disc material. No spinal canal stenosis. No neural foraminal stenosis. L5-S1: Small central disc protrusion, slightly increased. No spinal canal stenosis. No neural foraminal stenosis. Visualized sacrum: Normal. IMPRESSION: 1. Anterior fusion at L4-5 with small amount of residual central disc material. No spinal canal or neural foraminal stenosis. 2. Small central disc protrusion at L5-S1, slightly increased. No spinal canal or neural foraminal stenosis. Electronically Signed   By: Deatra Robinson M.D.   On: 11/11/2020 22:11    Procedures Procedures   Medications Ordered in ED Medications  diphenhydrAMINE (BENADRYL) injection 25 mg (25 mg Intravenous Patient Refused/Not Given 11/11/20 2202)  gadobutrol (GADAVIST) 1 MMOL/ML injection 7 mL (7 mLs Intravenous Contrast Given 11/11/20 2033)  HYDROmorphone (DILAUDID) injection 1 mg (1 mg Intravenous Given 11/11/20 2217)    ED Course  I have reviewed the triage vital signs and the nursing notes.  Pertinent labs & imaging results that were available during my care of the patient were reviewed by me and considered in my medical decision making (see chart for details).    MDM Rules/Calculators/A&P                           Alert 40 year old female no acute distress, nontoxic-appearing.  Presents to emergency department with chief complaint of lumbar back pain and numbness to bilateral lower extremities.  No midline tenderness or deformity to cervical, thoracic, lumbar spine.  Neuro exam reassuring.  Patient denies any saddle anesthesia or bladder/bowel dysfunction.  MRI imaging was ordered while patient was in triage.  MRI thoracic spine unremarkable.  MRI lumbar spine shows Small central disc protrusion  at L5-S, no spinal canal or neural foraminal stenosis.  Patient given Dilaudid for pain management.  BMP and CBC unremarkable.  Patient has marked improvement in symptoms after pain management.  Patient able to stand regularly.  We will have patient follow-up with neurosurgeon Dr. Danielle Dess.  Patient currently taking prednisone and has prescription for hydrocodone.  Discussed results, findings, treatment and follow up. Patient advised of return precautions. Patient verbalized understanding and agreed with plan.  Final Clinical Impression(s) / ED Diagnoses Final diagnoses:  Acute bilateral low back pain without sciatica  Protrusion of lumbar intervertebral disc    Rx / DC Orders ED Discharge Orders     None        Haskel Schroeder, PA-C 11/12/20 0000    Charlynne Pander, MD 11/14/20 1500

## 2020-12-01 ENCOUNTER — Inpatient Hospital Stay: Payer: BC Managed Care – PPO | Attending: Hematology & Oncology

## 2020-12-01 ENCOUNTER — Inpatient Hospital Stay: Payer: BC Managed Care – PPO | Admitting: Hematology & Oncology

## 2021-03-19 IMAGING — CT CT ANGIO CHEST
2 of 17 series · 12 of 37 positions shown · IV contrast (omnipaque)
Comparison: CT chest, abdomen and pelvis June 22, 2017
COMPARISON: CT chest, abdomen and pelvis June 22, 2017

Addendum:
CLINICAL DATA: Shortness of breath, abdominal pain, acute
generalized, recent surgery and concern for abscess

EXAM:
CT ANGIOGRAPHY CHEST
CT ABDOMEN AND PELVIS WITH CONTRAST
TECHNIQUE: Multidetector CT imaging of the chest was performed using the
standard protocol during bolus administration of intravenous
contrast. Multiplanar CT image reconstructions and MIPs were
obtained to evaluate the vascular anatomy. Multidetector CT imaging
of the abdomen and pelvis was performed using the standard protocol
during bolus administration of intravenous contrast.
CONTRAST:  100mL OMNIPAQUE IOHEXOL 350 MG/ML SOLN

[Series 6: pe thins · axial · 0.77mm/px · z∈[-237,-58]mm · 5 of 269 slices shown (1 of 2)]
[im 45/269  lung]
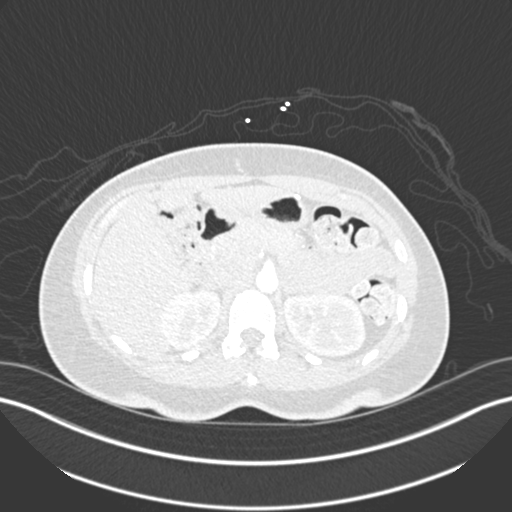
[im 90/269  lung]
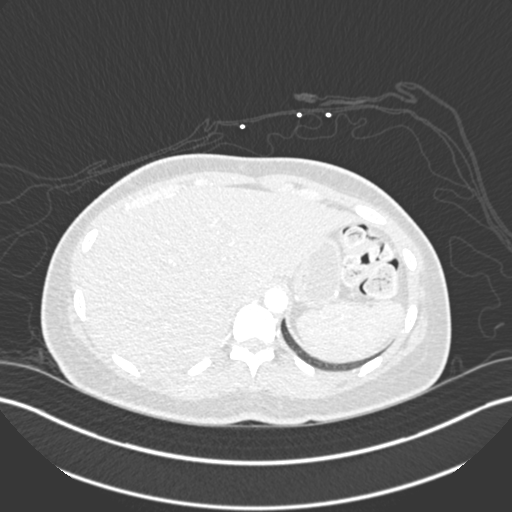
[im 135/269  lung]
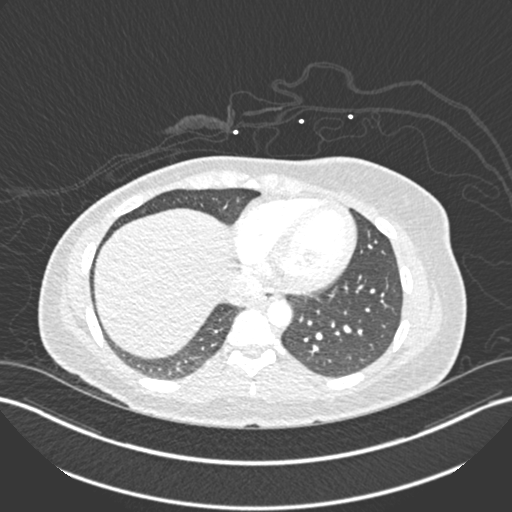
[im 179/269  lung]
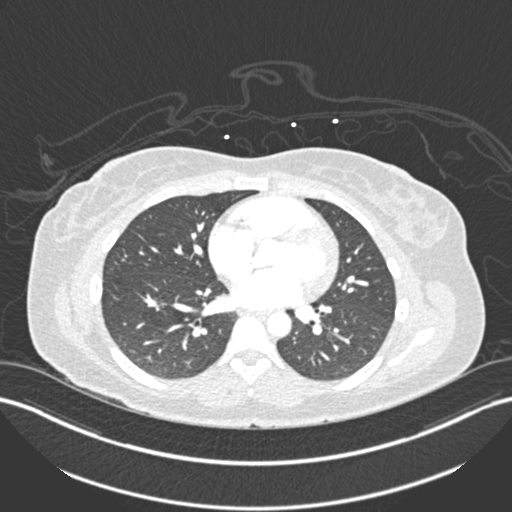
[im 224/269  lung]
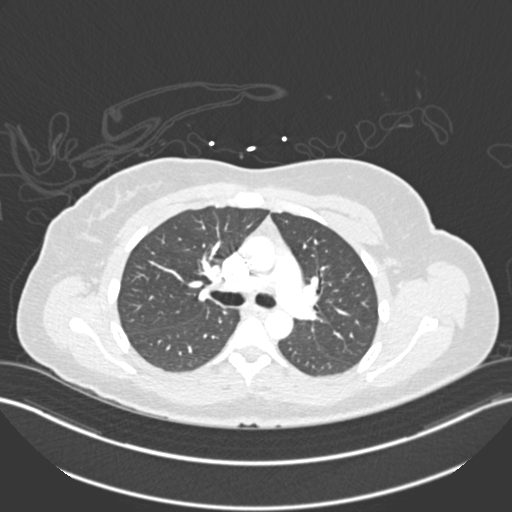

[Series 19: pe thins · axial · 0.88mm/px · z∈[-242,-7]mm · 7 of 315 slices shown (2 of 2)]
[im 40/315  lung]
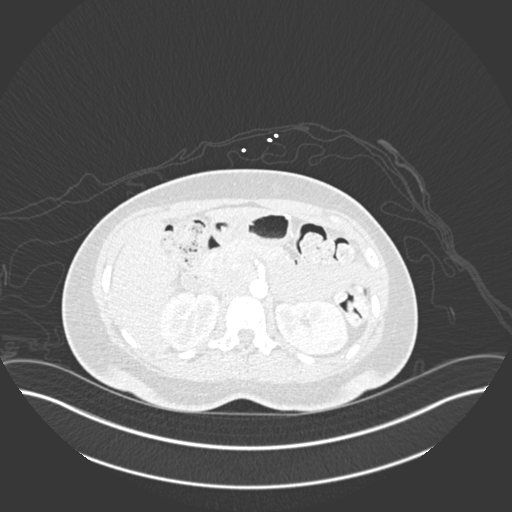
[im 79/315  mediastinal]
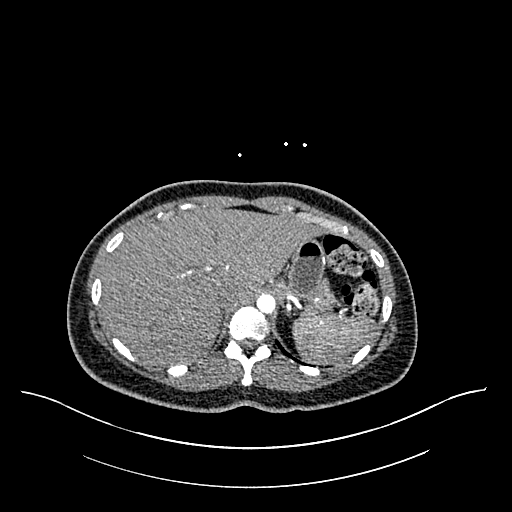
[im 118/315  lung]
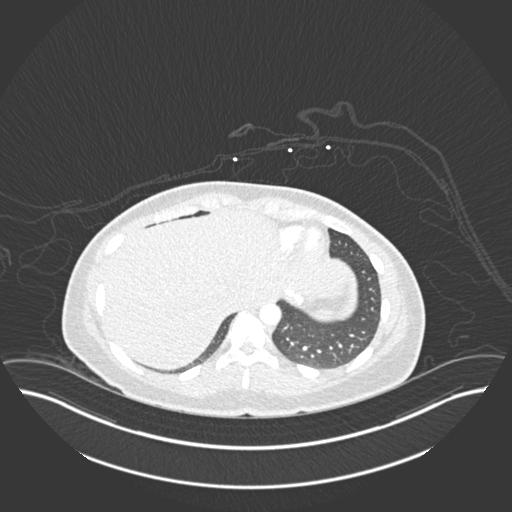
[im 158/315  mediastinal]
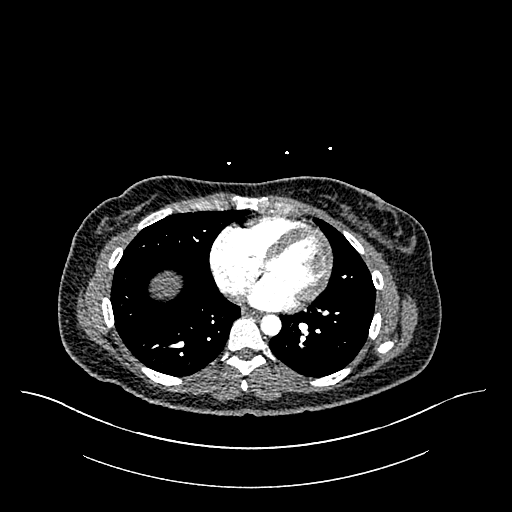
[im 197/315  lung]
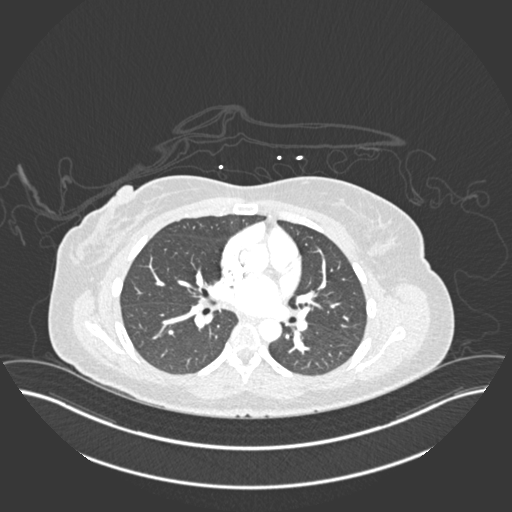
[im 236/315  mediastinal]
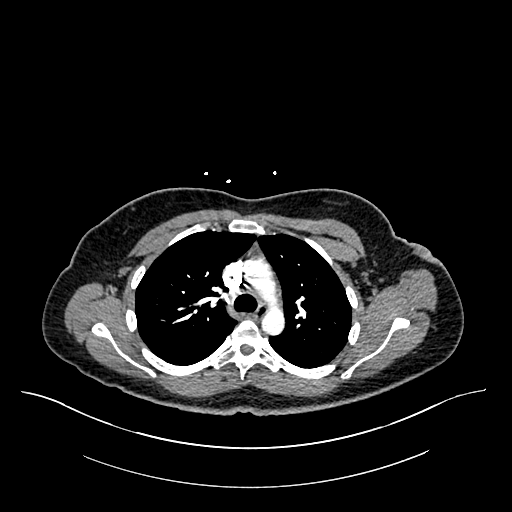
[im 275/315  lung]
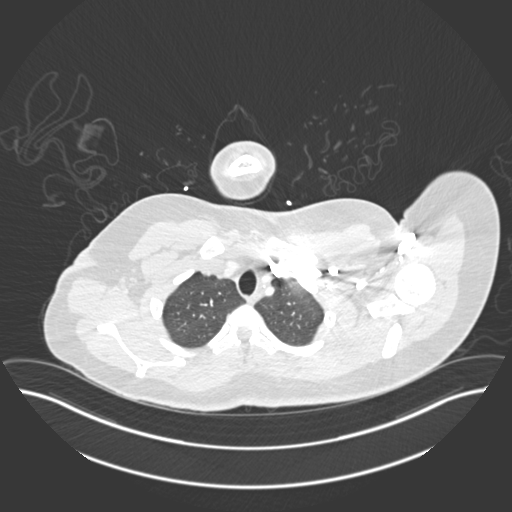

[12 of 37 positions shown; findings below may reference images not displayed]

FINDINGS: CTA CHEST FINDINGS

Cardiovascular: Satisfactory opacification of the pulmonary
arteries. No pulmonary arterial filling defects are identified.
Pulmonary arteries are normal caliber. Normal heart size. No
pericardial effusion. The aorta is normal caliber. Normal 3 vessel
arch. Major venous structures are unremarkable.

Mediastinum/Nodes: No enlarged mediastinal, hilar or axillary lymph
nodes. Thyroid gland, trachea, and esophagus demonstrate no
significant findings.

Lungs/Pleura: No consolidation, features of edema, pneumothorax, or
effusion. No suspicious pulmonary nodules or masses.

Musculoskeletal: No chest wall abnormality. No acute or significant
osseous findings.

Review of the MIP images confirms the above findings.

CT ABDOMEN and PELVIS FINDINGS

Hepatobiliary: No focal liver abnormality is seen. Patient is post
cholecystectomy. Slight prominence of the biliary tree likely
related to reservoir effect. No calcified intraductal gallstones.

Pancreas: Unremarkable. No pancreatic ductal dilatation or
surrounding inflammatory changes.

Spleen: Normal in size without focal abnormality.

Adrenals/Urinary Tract: Adrenal glands are unremarkable. Kidneys are
normal, without renal calculi, focal lesion, or hydronephrosis. Mild
circumferential bladder wall thickening.

Stomach/Bowel: Postsurgical changes from prior gastric sleeve.
Duodenal sweep is unremarkable. No small bowel dilatation or wall
thickening. The appendix is surgically absent. No colonic dilatation
or wall thickening.

Vascular/Lymphatic: The aorta is normal caliber. No suspicious or
enlarged lymph nodes in the included lymphatic chains.

Reproductive: There is been interval removal of the radiopaque IUD
seen on comparison study. The uterus heterogeneously enhances with
several foci suggestive of uterine fibroids. Small cystic focus is
seen within the posterior aspect of the uterine fundus. Normal
follicles seen in both adnexa.

Other: No intraperitoneal free fluid or gas. There are postsurgical
changes of the lower anterior abdominal wall to the left of midline
with a small lentiform fluid collection measuring 10 mm in maximal
thickness along the anterior abdominal wall and few adjacent foci of
subcutaneous gas. No abnormal dehiscence of the anterior abdominal
wall. No rim enhancing collection is identified.

Musculoskeletal: Interval L4-5 anterior spinal fusion with placement
of an interbody spacer cage. No other acute osseous abnormality or
suspicious osseous lesions.

Review of the MIP images confirms the above findings.
IMPRESSION: 1. No evidence of pulmonary embolism. No acute intrathoracic
process.
2. Postsurgical changes of the left low anterior abdominal wall
likely related to recent anterior lumbar spinal fusion and interbody
spacer placement at L4-5. The presence of fluid and gas near the
incision site is nonspecific given the time course since recent
surgery. Small amount of overlying skin thickening is expected
postoperatively though should correlate with clinical assessment to
exclude a cellulitis. No evidence of abdominal wall dehiscence or
organized abscess at this time. No intraperitoneal free fluid or
gas.
3. Mild circumferential bladder wall thickening is nonspecific, and
can be seen with cystitis. Correlate with urinalysis.
4. Postsurgical changes of prior gastric sleeve without evidence of
complication.
5. Interval removal of the radiopaque IUD seen on comparison study.
6. Fibroid uterus.

ADDENDUM:
Impression #6 should include: Small cystic foci at the uterine
fundus, nonspecific but could reflect fluid within the endometrial
canal or degenerating fibroid. Correlate with patient's menstrual
status and consider pelvic ultrasound if felt to be clinically
warranted.

*** End of Addendum ***
FINDINGS: CTA CHEST FINDINGS

Cardiovascular: Satisfactory opacification of the pulmonary
arteries. No pulmonary arterial filling defects are identified.
Pulmonary arteries are normal caliber. Normal heart size. No
pericardial effusion. The aorta is normal caliber. Normal 3 vessel
arch. Major venous structures are unremarkable.

Mediastinum/Nodes: No enlarged mediastinal, hilar or axillary lymph
nodes. Thyroid gland, trachea, and esophagus demonstrate no
significant findings.

Lungs/Pleura: No consolidation, features of edema, pneumothorax, or
effusion. No suspicious pulmonary nodules or masses.

Musculoskeletal: No chest wall abnormality. No acute or significant
osseous findings.

Review of the MIP images confirms the above findings.

CT ABDOMEN and PELVIS FINDINGS

Hepatobiliary: No focal liver abnormality is seen. Patient is post
cholecystectomy. Slight prominence of the biliary tree likely
related to reservoir effect. No calcified intraductal gallstones.

Pancreas: Unremarkable. No pancreatic ductal dilatation or
surrounding inflammatory changes.

Spleen: Normal in size without focal abnormality.

Adrenals/Urinary Tract: Adrenal glands are unremarkable. Kidneys are
normal, without renal calculi, focal lesion, or hydronephrosis. Mild
circumferential bladder wall thickening.

Stomach/Bowel: Postsurgical changes from prior gastric sleeve.
Duodenal sweep is unremarkable. No small bowel dilatation or wall
thickening. The appendix is surgically absent. No colonic dilatation
or wall thickening.

Vascular/Lymphatic: The aorta is normal caliber. No suspicious or
enlarged lymph nodes in the included lymphatic chains.

Reproductive: There is been interval removal of the radiopaque IUD
seen on comparison study. The uterus heterogeneously enhances with
several foci suggestive of uterine fibroids. Small cystic focus is
seen within the posterior aspect of the uterine fundus. Normal
follicles seen in both adnexa.

Other: No intraperitoneal free fluid or gas. There are postsurgical
changes of the lower anterior abdominal wall to the left of midline
with a small lentiform fluid collection measuring 10 mm in maximal
thickness along the anterior abdominal wall and few adjacent foci of
subcutaneous gas. No abnormal dehiscence of the anterior abdominal
wall. No rim enhancing collection is identified.

Musculoskeletal: Interval L4-5 anterior spinal fusion with placement
of an interbody spacer cage. No other acute osseous abnormality or
suspicious osseous lesions.

Review of the MIP images confirms the above findings.
IMPRESSION: 1. No evidence of pulmonary embolism. No acute intrathoracic
process.
2. Postsurgical changes of the left low anterior abdominal wall
likely related to recent anterior lumbar spinal fusion and interbody
spacer placement at L4-5. The presence of fluid and gas near the
incision site is nonspecific given the time course since recent
surgery. Small amount of overlying skin thickening is expected
postoperatively though should correlate with clinical assessment to
exclude a cellulitis. No evidence of abdominal wall dehiscence or
organized abscess at this time. No intraperitoneal free fluid or
gas.
3. Mild circumferential bladder wall thickening is nonspecific, and
can be seen with cystitis. Correlate with urinalysis.
4. Postsurgical changes of prior gastric sleeve without evidence of
complication.
5. Interval removal of the radiopaque IUD seen on comparison study.
6. Fibroid uterus.

## 2021-06-25 ENCOUNTER — Emergency Department (HOSPITAL_BASED_OUTPATIENT_CLINIC_OR_DEPARTMENT_OTHER)
Admission: EM | Admit: 2021-06-25 | Discharge: 2021-06-25 | Disposition: A | Discharge status: Home / Self Care | Payer: BC Managed Care – PPO | Attending: Emergency Medicine | Admitting: Emergency Medicine

## 2021-06-25 ENCOUNTER — Other Ambulatory Visit: Payer: Self-pay

## 2021-06-25 ENCOUNTER — Encounter (HOSPITAL_BASED_OUTPATIENT_CLINIC_OR_DEPARTMENT_OTHER): Payer: Self-pay | Admitting: Emergency Medicine

## 2021-06-25 DIAGNOSIS — S6991XA Unspecified injury of right wrist, hand and finger(s), initial encounter: Secondary | ICD-10-CM | POA: Diagnosis present

## 2021-06-25 DIAGNOSIS — Z23 Encounter for immunization: Secondary | ICD-10-CM | POA: Diagnosis not present

## 2021-06-25 DIAGNOSIS — W25XXXA Contact with sharp glass, initial encounter: Secondary | ICD-10-CM | POA: Diagnosis not present

## 2021-06-25 DIAGNOSIS — S61212A Laceration without foreign body of right middle finger without damage to nail, initial encounter: Secondary | ICD-10-CM | POA: Diagnosis not present

## 2021-06-25 DIAGNOSIS — Y93G1 Activity, food preparation and clean up: Secondary | ICD-10-CM | POA: Insufficient documentation

## 2021-06-25 MED ORDER — TETANUS-DIPHTH-ACELL PERTUSSIS 5-2.5-18.5 LF-MCG/0.5 IM SUSY
0.5000 mL | PREFILLED_SYRINGE | Freq: Once | INTRAMUSCULAR | Status: AC
  Administered 2021-06-25: 0.5 mL via INTRAMUSCULAR
  Filled 2021-06-25: qty 0.5

## 2021-06-25 MED ORDER — LIDOCAINE HCL (PF) 1 % IJ SOLN
5.0000 mL | Freq: Once | INTRAMUSCULAR | Status: DC
  Filled 2021-06-25: qty 5

## 2021-06-25 NOTE — ED Triage Notes (Signed)
Reports she was washing dishes and cut her middle and pointer finger with broken glass.

## 2021-06-25 NOTE — ED Provider Notes (Signed)
[facility] EMERGENCY DEPARTMENT  Provider Note  CSN: [id number] Arrival date & time: 06/25/21 0120  History Chief Complaint  Patient presents with   Extremity Laceration    Laura Shah is a 41 y.o. female reports she cut her R index and middle finger on glass while cleaning dishes about 8 hours ago. Initially tried to do wound care at home, but continued to have bleeding. TDAP is not UTD.    Home Medications Prior to Admission medications   Medication Sig Start Date End Date Taking? Authorizing Provider  buPROPion (WELLBUTRIN) 75 MG tablet Take 75 mg by mouth daily at 6 (six) AM. 06/08/20   [provider]  busPIRone (BUSPAR) 5 MG tablet Take 5 mg by mouth 2 (two) times daily. 10/04/20   [provider]  folic acid (FOLVITE) 1 MG tablet Take 1 tablet (1 mg total) by mouth daily. 04/14/20   Erenest Blank, NP  pantoprazole (PROTONIX) 40 MG tablet Take 1 tablet (40 mg total) by mouth daily. 08/28/14   Dione Booze, MD  traZODone (DESYREL) 50 MG tablet Take 25-50 mg by mouth at bedtime as needed. 06/08/20   [provider]     Allergies    Metoclopramide, Morphine and related, Morphine sulfate, and Other   Review of Systems   Review of Systems Please see HPI for pertinent positives and negatives  Physical Exam BP 135/84 (BP Location: Left Arm)   Pulse 100   Temp 97.8 F (36.6 C) (Oral)   Resp 18   Ht 5\' 2"  (1.575 m)   Wt 65.8 kg   LMP 06/11/2021   SpO2 100%   BMI 26.52 kg/m   Physical Exam Vitals and nursing note reviewed.  HENT:     Head: Normocephalic.     Nose: Nose normal.  Eyes:     Extraocular Movements: Extraocular movements intact.  Pulmonary:     Effort: Pulmonary effort is normal.  Musculoskeletal:        General: Normal range of motion.     Cervical back: Neck supple.  Skin:    Findings: No rash (on exposed skin).     Comments: 41mm superficial laceration over the palmar DIP joint of the R index finger, not  bleeding. 3cm linear laceration over the radial R middle finger over the PIP joint.   Neurological:     Mental Status: She is alert and oriented to person, place, and time.  Psychiatric:        Mood and Affect: Mood normal.    ED Results / Procedures / Treatments   EKG None  Procedures .4mLaceration Repair  Date/Time: 06/25/2021 3:35 AM Performed by: 06/27/2021, MD Authorized by: Pollyann Savoy, MD   Consent:    Consent obtained:  Verbal   Consent given by:  Patient Anesthesia:    Anesthesia method:  Local infiltration (digital block)   Local anesthetic:  Lidocaine 1% w/o epi Laceration details:    Location:  Finger   Finger location:  R long finger   Length (cm):  3 Pre-procedure details:    Preparation:  Patient was prepped and draped in usual sterile fashion Treatment:    Area cleansed with:  Povidone-iodine   Irrigation solution:  Sterile saline   Irrigation method:  Syringe   Visualized foreign bodies/material removed: no   Skin repair:    Repair method:  Sutures   Suture size:  5-0   Suture material:  Nylon   Suture technique:  Simple interrupted   Number of sutures:  4 Approximation:    Approximation:  Close Post-procedure details:    Dressing:  Non-adherent dressing   Procedure completion:  Tolerated well, no immediate complications  Medications Ordered in the ED Medications  lidocaine (PF) (XYLOCAINE) 1 % injection 5 mL (has no administration in time range)  Tdap (BOOSTRIX) injection 0.5 mL (0.5 mLs Intramuscular Given 06/25/21 0217)    Initial Impression and Plan  Patient with two finger lacerations, index is superficial and will not require repair. The Middle finger is longer and deeper and will need repair. TDAP updated.   ED Course       MDM Rules/Calculators/A&P Medical Decision Making Problems Addressed: Laceration of right middle finger without foreign body without damage to nail, initial encounter: acute illness or  injury  Risk Prescription drug management.    Final Clinical Impression(s) / ED Diagnoses Final diagnoses:  Laceration of right middle finger without foreign body without damage to nail, initial encounter    Rx / DC Orders ED Discharge Orders     None        Pollyann Savoy, MD 06/25/21 [phone]

## 2021-08-07 ENCOUNTER — Encounter: Payer: Self-pay | Admitting: Family

## 2021-08-14 ENCOUNTER — Encounter: Payer: Self-pay | Admitting: Family

## 2021-09-21 ENCOUNTER — Other Ambulatory Visit: Payer: Self-pay | Admitting: Nurse Practitioner

## 2021-09-21 DIAGNOSIS — Z1231 Encounter for screening mammogram for malignant neoplasm of breast: Secondary | ICD-10-CM

## 2021-10-03 ENCOUNTER — Encounter: Payer: Self-pay | Admitting: Neurology

## 2021-10-03 ENCOUNTER — Institutional Professional Consult (permissible substitution): Payer: BC Managed Care – PPO | Admitting: Neurology

## 2021-10-12 ENCOUNTER — Ambulatory Visit
Admission: RE | Admit: 2021-10-12 | Discharge: 2021-10-12 | Disposition: A | Discharge status: Home / Self Care | Payer: BC Managed Care – PPO | Source: Ambulatory Visit | Attending: Nurse Practitioner | Admitting: Nurse Practitioner

## 2021-10-12 DIAGNOSIS — Z1231 Encounter for screening mammogram for malignant neoplasm of breast: Secondary | ICD-10-CM

## 2021-11-13 ENCOUNTER — Other Ambulatory Visit: Payer: Self-pay

## 2021-11-13 ENCOUNTER — Encounter (HOSPITAL_BASED_OUTPATIENT_CLINIC_OR_DEPARTMENT_OTHER): Payer: Self-pay

## 2021-11-13 ENCOUNTER — Emergency Department (HOSPITAL_BASED_OUTPATIENT_CLINIC_OR_DEPARTMENT_OTHER)
Admission: EM | Admit: 2021-11-13 | Discharge: 2021-11-13 | Disposition: A | Discharge status: Home / Self Care | Payer: BC Managed Care – PPO | Attending: Emergency Medicine | Admitting: Emergency Medicine

## 2021-11-13 DIAGNOSIS — M5441 Lumbago with sciatica, right side: Secondary | ICD-10-CM | POA: Diagnosis present

## 2021-11-13 DIAGNOSIS — R1031 Right lower quadrant pain: Secondary | ICD-10-CM | POA: Insufficient documentation

## 2021-11-13 MED ORDER — HYDROMORPHONE HCL 1 MG/ML IJ SOLN
2.0000 mg | Freq: Once | INTRAMUSCULAR | Status: AC
  Administered 2021-11-13: 2 mg via INTRAMUSCULAR
  Filled 2021-11-13: qty 2

## 2021-11-13 MED ORDER — ONDANSETRON 4 MG PO TBDP
8.0000 mg | ORAL_TABLET | Freq: Once | ORAL | Status: AC
  Administered 2021-11-13: 8 mg via ORAL
  Filled 2021-11-13: qty 2

## 2021-11-13 NOTE — ED Triage Notes (Signed)
Pt reports having intercourse this evening and afterwards having sharp pain start in her lower back on the right side that radiates down her leg with associated "tingling". Denies loss of bladder/bowel function. Pt also reports right lower abdominal pain starting at this same time stating "it's right where my cyst is".

## 2021-11-13 NOTE — ED Notes (Signed)
Pt's spouse wheeled patient out in wheelchair. Pt in no acute distress upon departure.

## 2021-11-13 NOTE — ED Provider Notes (Signed)
[facility] Provider Note: Lowella Dell, MD, FACEP  CSN: [id number] MRN: [id number] ARRIVAL: 11/13/21 at 0103 ROOM: [facility]06/[facility]06   CHIEF COMPLAINT  Sciatica   HISTORY OF PRESENT ILLNESS  11/13/21 1:22 AM Laura Shah is a 41 y.o. female with a history of lumbar disc disease and back pain.  She is on chronic hydrocodone therapy.  She was having intercourse earlier and afterwards developed a sharp pain in her right lower back radiating down the back of her right leg and around to her right groin fold.  She is having some associated tingling in the right leg.  She has had no changes in bowel or bladder function.  Motor function is still intact.  She also reports right lower quadrant abdominal pain starting about the same time.  It is at the site of a known ovarian cyst.    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 Ruptured.   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   Right leg weakness    and numbness    Shortness of breath 10/27/2018   Tachycardia     Past Surgical History:  Procedure Laterality Date   ABDOMINAL EXPOSURE N/A 10/02/2018   Procedure: ABDOMINAL EXPOSURE;  Surgeon: Larina Earthly, MD;  Location: [facility] OR;  Service: Vascular;  Laterality: N/A;   ANTERIOR LUMBAR FUSION N/A 10/02/2018   Procedure: Lumbar  four-five Anterior lumbar interbody;  Surgeon: Barnett Abu, MD;  Location: [facility] OR;  Service: Neurosurgery;  Laterality: N/A;   APPENDECTOMY     BARIATRIC SURGERY     CESAREAN SECTION     CHOLECYSTECTOMY     ESOPHAGOGASTRODUODENOSCOPY (EGD) WITH PROPOFOL N/A 03/18/2015   Procedure: ESOPHAGOGASTRODUODENOSCOPY (EGD) WITH PROPOFOL;  Surgeon: Sherrilyn Rist, MD;  Location: [facility] ENDOSCOPY;  Service: Endoscopy;  Laterality: N/A;   GASTRECTOMY     laparoscopic sleeve gastrectomy 01/27/15   Leg Pain     LUMBAR DISC SURGERY     L4-5 discectomy 05/2017   OVARIAN CYST REMOVAL     Ruptured    Family History  Problem Relation Age of Onset   Breast cancer Maternal Grandmother    Breast cancer Paternal Grandmother    Diabetes Paternal Grandmother    Diabetes Father    Hypertension Father     Social History   Tobacco Use   Smoking status: Never   Smokeless tobacco: Never  Vaping Use   Vaping Use: Never used  Substance Use Topics   Alcohol use: Yes   Drug use: No    Prior to Admission medications   Medication Sig Start Date End Date Taking? Authorizing Provider  buPROPion (WELLBUTRIN) 75 MG tablet Take 75 mg by mouth daily at 6 (six)  AM. 06/08/20   [provider]  busPIRone (BUSPAR) 5 MG tablet Take 5 mg by mouth 2 (two) times daily. 10/04/20   [provider]  folic acid (FOLVITE) 1 MG tablet Take 1 tablet (1 mg total) by mouth daily. 04/14/20   Celso Amy, NP  pantoprazole (PROTONIX) 40 MG tablet Take 1 tablet (40 mg total) by mouth daily. 9/67/89   Delora Fuel, MD  traZODone (DESYREL) 50 MG tablet Take 25-50 mg by mouth at bedtime as needed. 06/08/20   [provider]    Allergies Metoclopramide, Morphine and related, Morphine sulfate, Other, and Tape   REVIEW OF SYSTEMS  Negative except as noted here or in the History of Present Illness.   PHYSICAL EXAMINATION  Initial Vital Signs Blood pressure 130/84, pulse 87, temperature 97.6 F (36.4 C),  temperature source Oral, resp. rate 18, height 5\' 2"  (1.575 m), weight 65.8 kg, SpO2 100 %.  Examination General: Well-developed, well-nourished female in no acute distress; appearance consistent with age of record HENT: normocephalic; atraumatic Eyes: Normal appearance Neck: supple Heart: regular rate and rhythm Lungs: clear to auscultation bilaterally Abdomen: soft; nondistended; right lower quadrant tenderness; bowel sounds present Back: Right paralumbar tenderness with positive straight leg raise on the right at about 15 degrees Extremities: No deformity; full range of motion; pulses normal Neurologic: Awake, alert and oriented; motor function intact in all extremities and symmetric but examination limited due to pain on movement of right hip; no facial droop Skin: Warm and dry Psychiatric: Grimacing   RESULTS  Summary of this visit's results, reviewed and interpreted by myself:   EKG Interpretation  Date/Time:    Ventricular Rate:    PR Interval:    QRS Duration:   QT Interval:    QTC Calculation:   R Axis:     Text Interpretation:         Laboratory Studies: No results found for this or any previous visit (from the past 24 hour(s)). Imaging Studies: No results found.  ED COURSE and MDM  Nursing notes, initial and subsequent vitals signs, including pulse oximetry, reviewed and interpreted by myself.  Vitals:   11/13/21 0115  BP: 130/84  Pulse: 87  Resp: 18  Temp: 97.6 F (36.4 C)  TempSrc: Oral  SpO2: 100%  Weight: 65.8 kg  Height: 5\' 2"  (1.575 m)   Medications  ondansetron (ZOFRAN-ODT) disintegrating tablet 8 mg (has no administration in time range)  HYDROmorphone (DILAUDID) injection 2 mg (has no administration in time range)   The presentation is consistent with sciatica which is also consistent with the patient's known lumbar disc disease.  As she is already on chronic pain management we cannot prescribe any additional narcotics but we will treat her  in the emergency department.  She is not displaying signs or symptoms of cauda equina syndrome.  She is also tender in the right lower quadrant and she may have aggravated her known ovarian cyst.  We will have her return later this morning for pelvic ultrasound.   PROCEDURES  Procedures   ED DIAGNOSES     ICD-10-CM   1. Acute right-sided low back pain with right-sided sciatica  M54.41     2. RLQ abdominal pain  R10.31          Natnael Biederman, Jenny Reichmann, MD 11/13/21 0134

## 2021-11-17 ENCOUNTER — Encounter (HOSPITAL_BASED_OUTPATIENT_CLINIC_OR_DEPARTMENT_OTHER): Payer: Self-pay | Admitting: Obstetrics and Gynecology

## 2021-11-17 ENCOUNTER — Other Ambulatory Visit: Payer: Self-pay

## 2021-11-17 NOTE — Progress Notes (Signed)
Spoke w/ via phone for pre-op interview---pt  Lab needs dos----UPT               Lab results------n/a COVID test -----patient states asymptomatic no test needed Arrive at -------0530 NPO after MN NO Solid Food.  Clear liquids from MN until---0430 Med rec completed Medications to take morning of surgery ---- amitriptyline and Protonix- Diabetic medication -----n/a Patient instructed no nail polish to be worn day of surgery Patient instructed to bring photo id and insurance card day of surgery Patient aware to have Driver (ride ) / caregiver Laura Shah   for 24 hours after surgery  Patient Special Instructions -----n/a Pre-Op special Istructions -----n/a Patient verbalized understanding of instructions that were given at this phone interview. Patient denies shortness of breath, chest pain, fever, cough at this phone interview.

## 2021-11-20 DIAGNOSIS — N83201 Unspecified ovarian cyst, right side: Secondary | ICD-10-CM | POA: Insufficient documentation

## 2021-11-20 NOTE — H&P (Incomplete)
Laura Shah is an 41 y.o. No obstetric history on file. 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   Palpitations 10/27/2018   Fatigue 10/27/2018   Shortness of breath 10/27/2018   GERD (gastroesophageal reflux disease) 10/11/2018   Cellulitis 10/10/2018   Herniated nucleus pulposus, L4-5 right 10/02/2018   Herniated nucleus pulposus, L4-5 10/02/2018   Right leg weakness    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   Constipation by delayed colonic transit    Acute pancreatitis 03/16/2015   OSA (obstructive sleep apnea) 03/16/2015   Anemia 03/16/2015   Hypokalemia 03/16/2015   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   MEDICAL/FAMILY/SOCIAL HX: Patient's last menstrual period was 10/23/2021 (exact date).    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 Ruptured.   OSA (obstructive sleep apnea) 03/16/2015   had sleep study done and didnt require a CPAP machine. 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 walks independently. 11/17/2021   Shortness of breath 10/27/2018   Tachycardia     Past Surgical History:  Procedure Laterality Date   ABDOMINAL EXPOSURE N/A 10/02/2018   Procedure: ABDOMINAL EXPOSURE;  Surgeon: Rosetta Posner, MD;  Location: [facility] OR;  Service: Vascular;  Laterality: N/A;   ANTERIOR LUMBAR FUSION N/A 10/02/2018   Procedure: Lumbar four-five Anterior lumbar interbody;  Surgeon: Kristeen Miss, MD;  Location: [place];  Service: Neurosurgery;  Laterality: N/A;   APPENDECTOMY     BARIATRIC SURGERY     CESAREAN SECTION     CHOLECYSTECTOMY     ESOPHAGOGASTRODUODENOSCOPY (EGD) WITH PROPOFOL N/A 03/18/2015   Procedure: ESOPHAGOGASTRODUODENOSCOPY (EGD) WITH PROPOFOL;  Surgeon: Doran Stabler, MD;  Location: [place];  Service: Endoscopy;  Laterality: N/A;   GASTRECTOMY     laparoscopic sleeve gastrectomy 01/27/15   Leg Pain     LUMBAR DISC SURGERY     L4-5 discectomy 05/2017   OVARIAN CYST REMOVAL     Ruptured    Family History  Problem Relation Age of Onset   Breast cancer Maternal Grandmother    Breast cancer Paternal Grandmother    Diabetes Paternal  Grandmother    Diabetes Father    Hypertension Father     Social History:  reports that she has never smoked. She has never used smokeless tobacco. She reports current alcohol use. She reports that she does not use drugs.  ALLERGIES/MEDS:  Allergies:  Allergies  Allergen Reactions   Metoclopramide Anxiety and Other (See Comments)   Morphine And Related Itching   Morphine Sulfate Other (See Comments)   Other Other (See Comments)   Tape Rash     No medications prior to admission.     ROS  Height 5\' 2"  (1.575 m), weight 70.3 kg, last menstrual period 10/23/2021. Physical Exam  No results found for this or any previous visit (from the past 24 hour(s)).  No results found.   ASSESSMENT/PLAN: Laura Shah is a 41 y.o. No obstetric history on file. who is admitted for [facility], DO [phone] (office)

## 2021-11-22 DIAGNOSIS — Z86718 Personal history of other venous thrombosis and embolism: Secondary | ICD-10-CM

## 2021-11-22 NOTE — Anesthesia Preprocedure Evaluation (Signed)
Anesthesia Evaluation  Patient identified by MRN, date of birth, ID band Patient awake    Reviewed: Allergy & Precautions, NPO status , Patient's Chart, lab work & pertinent test results  Airway Mallampati: II  TM Distance: >3 FB Neck ROM: Full    Dental no notable dental hx. (+) Teeth Intact, Dental Advisory Given   Pulmonary sleep apnea ,    Pulmonary exam normal breath sounds clear to auscultation       Cardiovascular Exercise Tolerance: Good Normal cardiovascular exam Rhythm:Regular Rate:Normal  TTE 10/2018 1. Left ventricular ejection fraction, by visual estimation, is 60 to  65%. The left ventricle has normal function. Normal left ventricular size.  There is no left ventricular hypertrophy.  2. Global right ventricle has normal systolic function.The right  ventricular size is normal. No increase in right ventricular wall  thickness.  3. Left atrial size was normal.  4. Right atrial size was normal.  5. The mitral valve is normal in structure. No evidence of mitral valve  regurgitation. No evidence of mitral stenosis.  6. The tricuspid valve is normal in structure. Tricuspid valve  regurgitation is mild.  7. The aortic valve is tricuspid Aortic valve regurgitation was not  visualized by color flow Doppler. Structurally normal aortic valve, with  no evidence of sclerosis or stenosis.  8. The pulmonic valve was normal in structure. Pulmonic valve  regurgitation is not visualized by color flow Doppler.  9. Normal pulmonary artery systolic pressure.  10. The inferior vena cava is normal in size with greater than 50%  respiratory variability, suggesting right atrial pressure of 3 mmHg.    Neuro/Psych Anxiety negative neurological ROS     GI/Hepatic Neg liver ROS, GERD  ,  Endo/Other  negative endocrine ROS  Renal/GU negative Renal ROS   R pelvic Mass    Musculoskeletal negative musculoskeletal ROS (+)    Abdominal   Peds  Hematology   Anesthesia Other Findings All: Metoclopramide, Morphine  Reproductive/Obstetrics negative OB ROS                            Anesthesia Physical Anesthesia Plan  ASA: 2  Anesthesia Plan: General   Post-op Pain Management: Dilaudid IV, Toradol IV (intra-op)* and Minimal or no pain anticipated   Induction: Intravenous  PONV Risk Score and Plan: 4 or greater and Treatment may vary due to age or medical condition, Midazolam, Ondansetron, Scopolamine patch - Pre-op and Dexamethasone  Airway Management Planned: Oral ETT  Additional Equipment: None  Intra-op Plan:   Post-operative Plan: Extubation in OR  Informed Consent: I have reviewed the patients History and Physical, chart, labs and discussed the procedure including the risks, benefits and alternatives for the proposed anesthesia with the patient or authorized representative who has indicated his/her understanding and acceptance.     Dental advisory given  Plan Discussed with: CRNA  Anesthesia Plan Comments:        Anesthesia Quick Evaluation

## 2021-11-23 ENCOUNTER — Ambulatory Visit (HOSPITAL_BASED_OUTPATIENT_CLINIC_OR_DEPARTMENT_OTHER)
Admission: RE | Admit: 2021-11-23 | Discharge: 2021-11-23 | Disposition: A | Discharge status: Home / Self Care | Payer: BC Managed Care – PPO | Source: Ambulatory Visit | Attending: Obstetrics and Gynecology | Admitting: Obstetrics and Gynecology

## 2021-11-23 ENCOUNTER — Other Ambulatory Visit: Payer: Self-pay

## 2021-11-23 ENCOUNTER — Encounter (HOSPITAL_BASED_OUTPATIENT_CLINIC_OR_DEPARTMENT_OTHER)
Admission: RE | Disposition: A | Discharge status: Home / Self Care | Payer: Self-pay | Source: Ambulatory Visit | Attending: Obstetrics and Gynecology

## 2021-11-23 ENCOUNTER — Ambulatory Visit (HOSPITAL_BASED_OUTPATIENT_CLINIC_OR_DEPARTMENT_OTHER): Payer: BC Managed Care – PPO | Admitting: Anesthesiology

## 2021-11-23 ENCOUNTER — Encounter (HOSPITAL_BASED_OUTPATIENT_CLINIC_OR_DEPARTMENT_OTHER): Payer: Self-pay | Admitting: Obstetrics and Gynecology

## 2021-11-23 DIAGNOSIS — Z9049 Acquired absence of other specified parts of digestive tract: Secondary | ICD-10-CM | POA: Insufficient documentation

## 2021-11-23 DIAGNOSIS — M5126 Other intervertebral disc displacement, lumbar region: Secondary | ICD-10-CM | POA: Diagnosis not present

## 2021-11-23 DIAGNOSIS — Z86718 Personal history of other venous thrombosis and embolism: Secondary | ICD-10-CM

## 2021-11-23 DIAGNOSIS — N83201 Unspecified ovarian cyst, right side: Secondary | ICD-10-CM | POA: Insufficient documentation

## 2021-11-23 DIAGNOSIS — D500 Iron deficiency anemia secondary to blood loss (chronic): Secondary | ICD-10-CM

## 2021-11-23 HISTORY — PX: OVARIAN CYST REMOVAL: SHX89

## 2021-11-23 LAB — CBC
HCT: 37.3 % (ref 36.0–46.0)
Hemoglobin: 11.6 g/dL — ABNORMAL LOW (ref 12.0–15.0)
MCH: 27.1 pg (ref 26.0–34.0)
MCHC: 31.1 g/dL (ref 30.0–36.0)
MCV: 87.1 fL (ref 80.0–100.0)
Platelets: 281 10*3/uL (ref 150–400)
RBC: 4.28 MIL/uL (ref 3.87–5.11)
RDW: 13.7 % (ref 11.5–15.5)
WBC: 4.2 10*3/uL (ref 4.0–10.5)
nRBC: 0 % (ref 0.0–0.2)

## 2021-11-23 LAB — POCT PREGNANCY, URINE: Preg Test, Ur: NEGATIVE

## 2021-11-23 LAB — TYPE AND SCREEN
ABO/RH(D): B NEG
Antibody Screen: NEGATIVE

## 2021-11-23 SURGERY — EXCISION, CYST, OVARY
Anesthesia: General | Site: Pelvis | Laterality: Right

## 2021-11-23 MED ORDER — HYDROMORPHONE HCL 1 MG/ML IJ SOLN
INTRAMUSCULAR | Status: AC
  Filled 2021-11-23: qty 1

## 2021-11-23 MED ORDER — SILVER NITRATE-POT NITRATE 75-25 % EX MISC
CUTANEOUS | Status: DC | PRN
  Administered 2021-11-23: 2

## 2021-11-23 MED ORDER — CEFAZOLIN SODIUM-DEXTROSE 2-3 GM-%(50ML) IV SOLR
INTRAVENOUS | Status: DC | PRN
  Administered 2021-11-23: 2 g via INTRAVENOUS

## 2021-11-23 MED ORDER — OXYCODONE HCL 5 MG PO TABS
5.0000 mg | ORAL_TABLET | Freq: Once | ORAL | Status: AC | PRN
  Administered 2021-11-23: 5 mg via ORAL

## 2021-11-23 MED ORDER — OXYCODONE HCL 5 MG PO TABS
ORAL_TABLET | ORAL | Status: AC
  Filled 2021-11-23: qty 1

## 2021-11-23 MED ORDER — ACETAMINOPHEN 500 MG PO TABS
1000.0000 mg | ORAL_TABLET | Freq: Once | ORAL | Status: AC
  Administered 2021-11-23: 1000 mg via ORAL

## 2021-11-23 MED ORDER — FENTANYL CITRATE (PF) 250 MCG/5ML IJ SOLN
INTRAMUSCULAR | Status: DC | PRN
  Administered 2021-11-23 (×3): 50 ug via INTRAVENOUS
  Administered 2021-11-23: 25 ug via INTRAVENOUS
  Administered 2021-11-23: 50 ug via INTRAVENOUS
  Administered 2021-11-23: 25 ug via INTRAVENOUS

## 2021-11-23 MED ORDER — ONDANSETRON HCL 4 MG/2ML IJ SOLN
INTRAMUSCULAR | Status: DC | PRN
  Administered 2021-11-23: 4 mg via INTRAVENOUS

## 2021-11-23 MED ORDER — DEXAMETHASONE SODIUM PHOSPHATE 10 MG/ML IJ SOLN
INTRAMUSCULAR | Status: AC
  Filled 2021-11-23: qty 2

## 2021-11-23 MED ORDER — PROPOFOL 10 MG/ML IV BOLUS
INTRAVENOUS | Status: DC | PRN
  Administered 2021-11-23: 120 mg via INTRAVENOUS

## 2021-11-23 MED ORDER — EPHEDRINE 5 MG/ML INJ
INTRAVENOUS | Status: AC
  Filled 2021-11-23: qty 5

## 2021-11-23 MED ORDER — PHENYLEPHRINE HCL (PRESSORS) 10 MG/ML IV SOLN
INTRAVENOUS | Status: AC
  Filled 2021-11-23: qty 1

## 2021-11-23 MED ORDER — OXYCODONE HCL 5 MG/5ML PO SOLN: 5.0000 mg | Freq: Once | ORAL | Status: AC | PRN

## 2021-11-23 MED ORDER — LIDOCAINE HCL (PF) 2 % IJ SOLN
INTRAMUSCULAR | Status: AC
  Filled 2021-11-23: qty 10

## 2021-11-23 MED ORDER — IBUPROFEN 600 MG PO TABS: 600.0000 mg | ORAL_TABLET | Freq: Four times a day (QID) | ORAL | 0 refills | Status: AC | PRN

## 2021-11-23 MED ORDER — KETOROLAC TROMETHAMINE 30 MG/ML IJ SOLN
INTRAMUSCULAR | Status: AC
  Filled 2021-11-23: qty 2

## 2021-11-23 MED ORDER — LACTATED RINGERS IV SOLN: INTRAVENOUS | Status: DC

## 2021-11-23 MED ORDER — HYDROMORPHONE HCL 1 MG/ML IJ SOLN
0.2500 mg | INTRAMUSCULAR | Status: DC | PRN
  Administered 2021-11-23 (×4): 0.5 mg via INTRAVENOUS

## 2021-11-23 MED ORDER — DIPHENHYDRAMINE HCL 25 MG PO CAPS
25.0000 mg | ORAL_CAPSULE | Freq: Once | ORAL | Status: AC
  Administered 2021-11-23: 25 mg via ORAL

## 2021-11-23 MED ORDER — MIDAZOLAM HCL 2 MG/2ML IJ SOLN
INTRAMUSCULAR | Status: AC
  Filled 2021-11-23: qty 2

## 2021-11-23 MED ORDER — PROPOFOL 10 MG/ML IV BOLUS
INTRAVENOUS | Status: AC
  Filled 2021-11-23: qty 20

## 2021-11-23 MED ORDER — PHENYLEPHRINE HCL-NACL 20-0.9 MG/250ML-% IV SOLN
INTRAVENOUS | Status: DC | PRN
  Administered 2021-11-23: 25 ug/min via INTRAVENOUS

## 2021-11-23 MED ORDER — EPHEDRINE SULFATE (PRESSORS) 50 MG/ML IJ SOLN
INTRAMUSCULAR | Status: DC | PRN
  Administered 2021-11-23: 5 mg via INTRAVENOUS

## 2021-11-23 MED ORDER — DIPHENHYDRAMINE HCL 25 MG PO CAPS
ORAL_CAPSULE | ORAL | Status: AC
  Filled 2021-11-23: qty 1

## 2021-11-23 MED ORDER — KETOROLAC TROMETHAMINE 30 MG/ML IJ SOLN: 30.0000 mg | Freq: Once | INTRAMUSCULAR | Status: DC | PRN

## 2021-11-23 MED ORDER — ENOXAPARIN SODIUM 40 MG/0.4ML IJ SOSY: 40.0000 mg | PREFILLED_SYRINGE | INTRAMUSCULAR | 0 refills | Status: DC

## 2021-11-23 MED ORDER — LIDOCAINE 2% (20 MG/ML) 5 ML SYRINGE
INTRAMUSCULAR | Status: DC | PRN
  Administered 2021-11-23: 100 mg via INTRAVENOUS

## 2021-11-23 MED ORDER — PHENYLEPHRINE 80 MCG/ML (10ML) SYRINGE FOR IV PUSH (FOR BLOOD PRESSURE SUPPORT)
PREFILLED_SYRINGE | INTRAVENOUS | Status: AC
  Filled 2021-11-23: qty 10

## 2021-11-23 MED ORDER — CEFAZOLIN SODIUM 1 G IJ SOLR
INTRAMUSCULAR | Status: AC
  Filled 2021-11-23: qty 20

## 2021-11-23 MED ORDER — FENTANYL CITRATE (PF) 250 MCG/5ML IJ SOLN
INTRAMUSCULAR | Status: AC
  Filled 2021-11-23: qty 5

## 2021-11-23 MED ORDER — ROCURONIUM BROMIDE 10 MG/ML (PF) SYRINGE
PREFILLED_SYRINGE | INTRAVENOUS | Status: AC
  Filled 2021-11-23: qty 20

## 2021-11-23 MED ORDER — PHENYLEPHRINE 80 MCG/ML (10ML) SYRINGE FOR IV PUSH (FOR BLOOD PRESSURE SUPPORT)
PREFILLED_SYRINGE | INTRAVENOUS | Status: DC | PRN
  Administered 2021-11-23: 80 ug via INTRAVENOUS
  Administered 2021-11-23 (×2): 160 ug via INTRAVENOUS

## 2021-11-23 MED ORDER — SUGAMMADEX SODIUM 200 MG/2ML IV SOLN
INTRAVENOUS | Status: DC | PRN
  Administered 2021-11-23: 200 mg via INTRAVENOUS

## 2021-11-23 MED ORDER — DEXMEDETOMIDINE HCL IN NACL 80 MCG/20ML IV SOLN
INTRAVENOUS | Status: DC | PRN
  Administered 2021-11-23: 8 ug via BUCCAL

## 2021-11-23 MED ORDER — 0.9 % SODIUM CHLORIDE (POUR BTL) OPTIME
TOPICAL | Status: DC | PRN
  Administered 2021-11-23: 500 mL

## 2021-11-23 MED ORDER — BUPIVACAINE HCL (PF) 0.25 % IJ SOLN
INTRAMUSCULAR | Status: DC | PRN
  Administered 2021-11-23: 30 mL

## 2021-11-23 MED ORDER — ACETAMINOPHEN 500 MG PO TABS
ORAL_TABLET | ORAL | Status: AC
  Filled 2021-11-23: qty 2

## 2021-11-23 MED ORDER — ONDANSETRON HCL 4 MG/2ML IJ SOLN
INTRAMUSCULAR | Status: AC
  Filled 2021-11-23: qty 4

## 2021-11-23 MED ORDER — ROCURONIUM BROMIDE 10 MG/ML (PF) SYRINGE
PREFILLED_SYRINGE | INTRAVENOUS | Status: DC | PRN
  Administered 2021-11-23: 50 mg via INTRAVENOUS

## 2021-11-23 MED ORDER — MIDAZOLAM HCL 2 MG/2ML IJ SOLN
INTRAMUSCULAR | Status: DC | PRN
  Administered 2021-11-23: 2 mg via INTRAVENOUS

## 2021-11-23 MED ORDER — ONDANSETRON HCL 4 MG/2ML IJ SOLN: 4.0000 mg | Freq: Once | INTRAMUSCULAR | Status: DC | PRN

## 2021-11-23 MED ORDER — HEMOSTATIC AGENTS (NO CHARGE) OPTIME
TOPICAL | Status: DC | PRN
  Administered 2021-11-23: 1

## 2021-11-23 MED ORDER — DEXAMETHASONE SODIUM PHOSPHATE 10 MG/ML IJ SOLN
INTRAMUSCULAR | Status: DC | PRN
  Administered 2021-11-23: 10 mg via INTRAVENOUS

## 2021-11-23 MED ORDER — KETOROLAC TROMETHAMINE 30 MG/ML IJ SOLN
INTRAMUSCULAR | Status: DC | PRN
  Administered 2021-11-23: 30 mg via INTRAVENOUS

## 2021-11-23 SURGICAL SUPPLY — 33 items
ADH SKN CLS APL DERMABOND .7 (GAUZE/BANDAGES/DRESSINGS) ×2
APL SRG 38 LTWT LNG FL B (MISCELLANEOUS) ×2
APPLICATOR ARISTA FLEXITIP XL (MISCELLANEOUS) ×1 IMPLANT
CABLE HIGH FREQUENCY MONO STRZ (ELECTRODE) IMPLANT
DERMABOND ADVANCED .7 DNX12 (GAUZE/BANDAGES/DRESSINGS) ×1 IMPLANT
DRSG OPSITE POSTOP 3X4 (GAUZE/BANDAGES/DRESSINGS) IMPLANT
GAUZE 4X4 16PLY ~~LOC~~+RFID DBL (SPONGE) ×2 IMPLANT
GLOVE BIO SURGEON STRL SZ 6.5 (GLOVE) ×4 IMPLANT
GLOVE BIOGEL PI IND STRL 6.5 (GLOVE) ×2 IMPLANT
GLOVE BIOGEL PI IND STRL 7.0 (GLOVE) ×4 IMPLANT
GOWN STRL REUS W/TWL LRG LVL3 (GOWN DISPOSABLE) ×4 IMPLANT
HEMOSTAT ARISTA ABSORB 3G PWDR (HEMOSTASIS) ×1 IMPLANT
KIT TURNOVER CYSTO (KITS) ×2 IMPLANT
LIGASURE VESSEL 5MM BLUNT TIP (ELECTROSURGICAL) IMPLANT
NS IRRIG 1000ML POUR BTL (IV SOLUTION) ×2 IMPLANT
PACK LAPAROSCOPY BASIN (CUSTOM PROCEDURE TRAY) ×2 IMPLANT
PACK TRENDGUARD 450 HYBRID PRO (MISCELLANEOUS) IMPLANT
PROTECTOR NERVE ULNAR (MISCELLANEOUS) ×4 IMPLANT
SET IRRIG TUBING LAPAROSCOPIC (IRRIGATION / IRRIGATOR) IMPLANT
SET TUBE SMOKE EVAC HIGH FLOW (TUBING) ×2 IMPLANT
SHEARS HARMONIC ACE PLUS 36CM (ENDOMECHANICALS) ×1 IMPLANT
SLEEVE ADV FIXATION 5X100MM (TROCAR) ×2 IMPLANT
SOLUTION ELECTROLUBE (MISCELLANEOUS) IMPLANT
SUT VIC AB 4-0 PS2 18 (SUTURE) ×1 IMPLANT
SUT VICRYL 0 UR6 27IN ABS (SUTURE) IMPLANT
SUT VICRYL 4-0 PS2 18IN ABS (SUTURE) ×2 IMPLANT
SYS BAG RETRIEVAL 10MM (BASKET)
SYSTEM BAG RETRIEVAL 10MM (BASKET) IMPLANT
TRAY FOLEY W/BAG SLVR 14FR (SET/KITS/TRAYS/PACK) ×2 IMPLANT
TRENDGUARD 450 HYBRID PRO PACK (MISCELLANEOUS)
TROCAR Z-THREAD FIOS 11X100 BL (TROCAR) IMPLANT
TROCAR Z-THREAD FIOS 5X100MM (TROCAR) ×2 IMPLANT
WARMER LAPAROSCOPE (MISCELLANEOUS) ×2 IMPLANT

## 2021-11-23 NOTE — Transfer of Care (Signed)
Immediate Anesthesia Transfer of Care Note  Patient: Laura Shah  Procedure(s) Performed: LAPAROSCOPIC OVARIAN CYSTECTOMY (Right: Pelvis)  Patient Location: PACU  Anesthesia Type:General  Level of Consciousness: awake, alert  and oriented  Airway & Oxygen Therapy: Patient Spontanous Breathing  Post-op Assessment: Report given to RN and Post -op Vital signs reviewed and stable  Post vital signs: Reviewed and stable  Last Vitals:  Vitals Value Taken Time  BP 119/75 11/23/21 0909  Temp    Pulse 65 11/23/21 0912  Resp 16 11/23/21 0913  SpO2 96 % 11/23/21 0912  Vitals shown include unvalidated device data.  Last Pain:  Vitals:   11/23/21 0609  TempSrc:   PainSc: 7          Complications: No notable events documented.

## 2021-11-23 NOTE — Anesthesia Postprocedure Evaluation (Signed)
Anesthesia Post Note  Patient: Laura Shah  Procedure(s) Performed: LAPAROSCOPIC OVARIAN CYSTECTOMY (Right: Pelvis)     Patient location during evaluation: PACU Anesthesia Type: General Level of consciousness: awake and alert Pain management: pain level controlled Vital Signs Assessment: post-procedure vital signs reviewed and stable Respiratory status: spontaneous breathing, nonlabored ventilation, respiratory function stable and patient connected to nasal cannula oxygen Cardiovascular status: blood pressure returned to baseline and stable Postop Assessment: no apparent nausea or vomiting Anesthetic complications: no   No notable events documented.  Last Vitals:  Vitals:   11/23/21 1000 11/23/21 1030  BP: 108/61 106/66  Pulse: 77 73  Resp: (!) 24 16  Temp:  (!) 36.4 C  SpO2: 96% 100%    Last Pain:  Vitals:   11/23/21 1030  TempSrc:   PainSc: 0-No pain                 Barnet Glasgow

## 2021-11-23 NOTE — Op Note (Addendum)
Pre Op Dx:   1. Right ovarian cyst 2. Pelvic pain  Post Op Dx:   Same as pre-operative diagnoses  Procedure:   Laparoscopic right ovarian cystectomy  Surgeon:  Dr. Wellington Hampshire. Delora Fuel Assistants:  Dr. Christophe Louis Anesthesia:  General  EBL:  5cc  IVF:  800cc UOP:  50cc clear yellow urine  Drains:  Foley catheter Specimen removed:  Right ovarian cyst Device(s) implanted:  None Case Type:  Clean-contaminated Findings: Normal-appearing uterus, bilateral fallopian tubes and left ovary. Right ovary with a ~4cm hemorraghic cyst. No pelvic or abdominal adhesions. Liver contours appeared normal. Absence of gallbladder consistent with prior cholecystectomy.  Complications: None Indications:  41 y.o. G4P3 with a 7.3cm complex appearing ovarian cyst which decreased in size to 4.8cm with significant pelvic pain who desired removal.   Description of procedure:  After informed consent the patient was taken to the operating room and placed in dorsal supine position where general endotracheal anesthesia was administered and found to be adequate. A pre-operative time-out completed. She was placed in dorsal lithotomy position.  She was prepped and draped in the usual sterile fashion.  A Hulka uterine manipulator with and a Foley catheter were placed. The abdomen was entered through a 57mm incision at Palmer's point under direct visualization and a pneumoperitoneum was established.  Right and left lower quadrant ports were placed under visualization. Local anesthetic (10cc) was placed at each port site prior to entry. The entry point was visualized from an inferior port and atraumatic entry confirmed. The findings as above was noted. Pelvic washings obtained. The right ovarian cyst was divided and removed from normal ovarian tissue using the Harmonic scalpel. The specimen was removed. Hemostasis achieved using the Harmonic scalpel on the ovary. Irrigation was performed and hemostasis confirmed. Arista hemostatic  agent placed on the ovary for additional hemostasis. The ports were withdrawn under direct visualization.  The pneumoperitoneum was reduced completely and the camera port withdrawn under visualization.  The 76mm port site was closed in an open technique using 0-Vicryl.  The skin was closed with 4-0 Vicryl in subcuticular fashion. The Hulka tenaculum was removed and the tenaculum site was made hemostatic with silver nitrate and pressure. The foley catheter was removed. The patient was returned to dorsal supine position, awakened and extubated in the OR having appeared to tolerate the procedure well.  All sponge, needle, and instrument counts were correct x 2 at the end of the case.    Disposition:  PACU  Drema Dallas, DO

## 2021-11-23 NOTE — Discharge Instructions (Addendum)
     No acetaminophen/Tylenol until after 12:30 pm today if needed.   No ibuprofen, Advil, Aleve, Motrin, ketorolac, meloxicam, naproxen, or other NSAIDS until after 2:30 pm today if needed.      Post Anesthesia Home Care Instructions  Activity: Get plenty of rest for the remainder of the day. A responsible individual must stay with you for 24 hours following the procedure.  For the next 24 hours, DO NOT: -Drive a car -Paediatric nurse -Drink alcoholic beverages -Take any medication unless instructed by your physician -Make any legal decisions or sign important papers.  Meals: Start with liquid foods such as gelatin or soup. Progress to regular foods as tolerated. Avoid greasy, spicy, heavy foods. If nausea and/or vomiting occur, drink only clear liquids until the nausea and/or vomiting subsides. Call your physician if vomiting continues.  Special Instructions/Symptoms: Your throat may feel dry or sore from the anesthesia or the breathing tube placed in your throat during surgery. If this causes discomfort, gargle with warm salt water. The discomfort should disappear within 24 hours.

## 2021-11-23 NOTE — Anesthesia Procedure Notes (Signed)
Procedure Name: Intubation Date/Time: 11/23/2021 7:39 AM  Performed by: Clearnce Sorrel, CRNAPre-anesthesia Checklist: Patient identified, Emergency Drugs available, Suction available and Patient being monitored Patient Re-evaluated:Patient Re-evaluated prior to induction Oxygen Delivery Method: Circle System Utilized Preoxygenation: Pre-oxygenation with 100% oxygen Induction Type: IV induction Ventilation: Mask ventilation without difficulty Laryngoscope Size: Mac and 3 Grade View: Grade I Tube type: Oral Tube size: 7.0 mm Number of attempts: 1 Airway Equipment and Method: Stylet and Oral airway Placement Confirmation: ETT inserted through vocal cords under direct vision, positive ETCO2 and breath sounds checked- equal and bilateral Secured at: 22 cm Tube secured with: Tape Dental Injury: Teeth and Oropharynx as per pre-operative assessment

## 2021-11-23 NOTE — Interval H&P Note (Signed)
History and Physical Interval Note:  11/23/2021 7:22 AM  Laura Shah  has presented today for surgery, with the diagnosis of Cyst of Right Ovary.  The various methods of treatment have been discussed with the patient and family. After consideration of risks, benefits and other options for treatment, the patient has consented to  Procedure(s): LAPAROSCOPIC OVARIAN CYSTECTOMY (Right) POSSIBLE LAPAROSCOPIC OOPHORECTOMY (Right) as a surgical intervention. She understands that there is possibility for salpingectomy as well. She does not desire future pregnancies. The patient's history has been reviewed, patient examined, no change in status, stable for surgery.  I have reviewed the patient's chart and labs.  Questions were answered to the patient's satisfaction.     Drema Dallas

## 2021-11-24 LAB — CYTOLOGY - NON PAP

## 2021-11-27 ENCOUNTER — Encounter (HOSPITAL_BASED_OUTPATIENT_CLINIC_OR_DEPARTMENT_OTHER): Payer: Self-pay | Admitting: Obstetrics and Gynecology

## 2021-11-27 LAB — SURGICAL PATHOLOGY

## 2021-11-28 ENCOUNTER — Other Ambulatory Visit: Payer: Self-pay

## 2021-11-28 ENCOUNTER — Emergency Department (HOSPITAL_COMMUNITY): Payer: BC Managed Care – PPO

## 2021-11-28 ENCOUNTER — Emergency Department (HOSPITAL_COMMUNITY)
Admission: EM | Admit: 2021-11-28 | Discharge: 2021-11-28 | Disposition: A | Discharge status: Home / Self Care | Payer: BC Managed Care – PPO | Attending: Emergency Medicine | Admitting: Emergency Medicine

## 2021-11-28 ENCOUNTER — Encounter (HOSPITAL_COMMUNITY): Payer: Self-pay

## 2021-11-28 ENCOUNTER — Emergency Department (HOSPITAL_BASED_OUTPATIENT_CLINIC_OR_DEPARTMENT_OTHER): Payer: BC Managed Care – PPO

## 2021-11-28 DIAGNOSIS — R0602 Shortness of breath: Secondary | ICD-10-CM | POA: Diagnosis not present

## 2021-11-28 DIAGNOSIS — R072 Precordial pain: Secondary | ICD-10-CM | POA: Diagnosis not present

## 2021-11-28 DIAGNOSIS — R609 Edema, unspecified: Secondary | ICD-10-CM | POA: Diagnosis not present

## 2021-11-28 DIAGNOSIS — R1084 Generalized abdominal pain: Secondary | ICD-10-CM | POA: Insufficient documentation

## 2021-11-28 DIAGNOSIS — R0789 Other chest pain: Secondary | ICD-10-CM | POA: Diagnosis present

## 2021-11-28 LAB — COMPREHENSIVE METABOLIC PANEL
ALT: 21 U/L (ref 0–44)
AST: 20 U/L (ref 15–41)
Albumin: 4 g/dL (ref 3.5–5.0)
Alkaline Phosphatase: 78 U/L (ref 38–126)
Anion gap: 6 (ref 5–15)
BUN: 10 mg/dL (ref 6–20)
CO2: 26 mmol/L (ref 22–32)
Calcium: 9.5 mg/dL (ref 8.9–10.3)
Chloride: 107 mmol/L (ref 98–111)
Creatinine, Ser: 0.87 mg/dL (ref 0.44–1.00)
GFR, Estimated: >60 mL/min (ref >60)
Glucose, Bld: 92 mg/dL (ref 70–99)
Potassium: 3.8 mmol/L (ref 3.5–5.1)
Sodium: 139 mmol/L (ref 135–145)
Total Bilirubin: 0.6 mg/dL (ref 0.3–1.2)
Total Protein: 7.7 g/dL (ref 6.5–8.1)

## 2021-11-28 LAB — CBC WITH DIFFERENTIAL/PLATELET
Abs Immature Granulocytes: 0.02 10*3/uL (ref 0.00–0.07)
Basophils Absolute: 0 10*3/uL (ref 0.0–0.1)
Basophils Relative: 0 %
Eosinophils Absolute: 0.3 10*3/uL (ref 0.0–0.5)
Eosinophils Relative: 5 %
HCT: 38.9 % (ref 36.0–46.0)
Hemoglobin: 12.2 g/dL (ref 12.0–15.0)
Immature Granulocytes: 0 %
Lymphocytes Relative: 19 %
Lymphs Abs: 1.2 10*3/uL (ref 0.7–4.0)
MCH: 27.2 pg (ref 26.0–34.0)
MCHC: 31.4 g/dL (ref 30.0–36.0)
MCV: 86.8 fL (ref 80.0–100.0)
Monocytes Absolute: 0.5 10*3/uL (ref 0.1–1.0)
Monocytes Relative: 8 %
Neutro Abs: 4.2 10*3/uL (ref 1.7–7.7)
Neutrophils Relative %: 68 %
Platelets: 283 10*3/uL (ref 150–400)
RBC: 4.48 MIL/uL (ref 3.87–5.11)
RDW: 13.5 % (ref 11.5–15.5)
WBC: 6.2 10*3/uL (ref 4.0–10.5)
nRBC: 0 % (ref 0.0–0.2)

## 2021-11-28 LAB — PROTIME-INR
INR: 1.1 (ref 0.8–1.2)
Prothrombin Time: 13.8 seconds (ref 11.4–15.2)

## 2021-11-28 LAB — LIPASE, BLOOD: Lipase: 49 U/L (ref 11–51)

## 2021-11-28 LAB — I-STAT BETA HCG BLOOD, ED (MC, WL, AP ONLY): I-stat hCG, quantitative: <5 m[IU]/mL (ref <5)

## 2021-11-28 LAB — TROPONIN I (HIGH SENSITIVITY)
Troponin I (High Sensitivity): <2 ng/L (ref <18)
Troponin I (High Sensitivity): <2 ng/L (ref <18)

## 2021-11-28 MED ORDER — ONDANSETRON 4 MG PO TBDP: 4.0000 mg | ORAL_TABLET | Freq: Three times a day (TID) | ORAL | 0 refills | Status: DC | PRN

## 2021-11-28 MED ORDER — SODIUM CHLORIDE (PF) 0.9 % IJ SOLN
INTRAMUSCULAR | Status: AC
  Filled 2021-11-28: qty 50

## 2021-11-28 MED ORDER — SENNOSIDES-DOCUSATE SODIUM 8.6-50 MG PO TABS: 1.0000 | ORAL_TABLET | Freq: Every evening | ORAL | 0 refills | Status: DC | PRN

## 2021-11-28 MED ORDER — OXYCODONE-ACETAMINOPHEN 5-325 MG PO TABS: 1.0000 | ORAL_TABLET | Freq: Four times a day (QID) | ORAL | 0 refills | Status: DC | PRN

## 2021-11-28 MED ORDER — FENTANYL CITRATE PF 50 MCG/ML IJ SOSY
50.0000 ug | PREFILLED_SYRINGE | INTRAMUSCULAR | Status: DC | PRN
  Administered 2021-11-28: 50 ug via INTRAVENOUS
  Filled 2021-11-28: qty 1

## 2021-11-28 MED ORDER — SODIUM CHLORIDE 0.9 % IV BOLUS
1000.0000 mL | Freq: Once | INTRAVENOUS | Status: AC
  Administered 2021-11-28: 1000 mL via INTRAVENOUS

## 2021-11-28 MED ORDER — IOHEXOL 350 MG/ML SOLN
80.0000 mL | Freq: Once | INTRAVENOUS | Status: AC | PRN
  Administered 2021-11-28: 80 mL via INTRAVENOUS

## 2021-11-28 MED ORDER — ONDANSETRON HCL 4 MG/2ML IJ SOLN
4.0000 mg | Freq: Once | INTRAMUSCULAR | Status: AC
  Administered 2021-11-28: 4 mg via INTRAVENOUS
  Filled 2021-11-28: qty 2

## 2021-11-28 NOTE — ED Triage Notes (Addendum)
Patient said she had surgery 10/26 that removed a cyst on her right ovary. Last night she said she woke up having trouble breathing and then when she went to urinate she had long stringy blood clots. She said she had a history of having blood clots. Giving herself lovenox shots since her surgery.

## 2021-11-28 NOTE — ED Provider Notes (Signed)
Emergency Department Provider Note   I have reviewed the triage vital signs and the nursing notes.   HISTORY  Chief Complaint Shortness of Breath (/)   HPI Laura Shah is a 41 y.o. female with past history reviewed below including ovarian cyst status post laparoscopic right ovarian cystectomy (Dr. Delora Fuel) presents to the ED with acute onset chest discomfort and shortness of breath.  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  She has had no prior history of PE.  She has been on Lovenox since her surgery and states that her postoperative course has been complicated by inflammation to her incision sites.  She is currently on outpatient doxycycline.  Denies fevers.  She is having some worsening abdominal pain.  Patient awoke this morning with central to left-sided chest pain, cough, shortness of breath.  No hemoptysis.  She did have some stringy clots which she passed per vagina but denies discharge or other foul-smelling lochia. No UTI symptoms.   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 Ruptured.   OSA (obstructive sleep apnea) 03/16/2015   had sleep study done and didnt require a CPAP machine. 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 walks  independently. 11/17/2021   Shortness of breath 10/27/2018   Tachycardia     Review of Systems  Constitutional: No fever/chills Eyes: No visual changes. ENT: No sore throat. Cardiovascular: Positive chest pain. Respiratory: Positive shortness of breath. Gastrointestinal: Positive abdominal pain. Positive nausea and vomiting.  No diarrhea.  No constipation. Genitourinary: Negative for dysuria. Musculoskeletal: Negative for back pain. Skin: Negative for rash. Neurological: Negative for headaches, focal weakness or numbness.   ____________________________________________   PHYSICAL EXAM:  VITAL SIGNS: ED Triage Vitals  Enc Vitals Group     BP 11/28/21 1012 137/80     Pulse Rate 11/28/21 1012 89     Resp 11/28/21 1012 18     Temp 11/28/21 1012 98.1 F (36.7 C)     Temp Source 11/28/21 1012 Oral     SpO2 11/28/21 1012 100 %     Weight 11/28/21 1012 150 lb (68 kg)     Height 11/28/21 1012 5\' 2"  (1.575 m)   Constitutional: Alert and oriented. Well appearing and in no acute distress. Eyes: Conjunctivae are normal.  Head: Atraumatic. Nose: No congestion/rhinnorhea. Mouth/Throat: Mucous membranes are moist.  Neck: No stridor.   Cardiovascular: Normal rate, regular rhythm. Good peripheral circulation. Grossly normal heart sounds.   Respiratory: Normal respiratory effort.  No retractions. Lungs CTAB. Gastrointestinal: Soft with diffuse tenderness without peritonitis. No distention.  Musculoskeletal: No lower extremity tenderness nor edema. No gross deformities of  extremities. Neurologic:  Normal speech and language. No gross focal neurologic deficits are appreciated.  Skin:  Skin is warm, dry and intact. No rash noted.  ____________________________________________   LABS (all labs ordered are listed, but only abnormal results are displayed)  Labs Reviewed  COMPREHENSIVE METABOLIC PANEL  LIPASE, BLOOD  CBC WITH DIFFERENTIAL/PLATELET  PROTIME-INR  URINALYSIS, ROUTINE W  REFLEX MICROSCOPIC  I-STAT BETA HCG BLOOD, ED ([facility], [facility], [facility] ONLY)  TROPONIN I (HIGH SENSITIVITY)   ____________________________________________  EKG   EKG Interpretation  Date/Time:  Tuesday November 28 2021 10:43:59 EDT Ventricular Rate:  78 PR Interval:  136 QRS Duration: 89 QT Interval:  363 QTC Calculation: 414 R Axis:   12 Text Interpretation: Sinus rhythm Confirmed by Alona Bene [phone]) on 11/28/2021 11:25:35 AM        ____________________________________________  RADIOLOGY  DG Chest 2 View  Result Date: 11/28/2021 CLINICAL DATA:  Cough and shortness of breath, history of blood clots and recent surgery EXAM: CHEST - 2 VIEW COMPARISON:  11/27/2021 FINDINGS: Normal heart size, mediastinal contours, and pulmonary vascularity. Lungs clear. No pulmonary infiltrate, pleural effusion, or pneumothorax. Osseous structures unremarkable. IMPRESSION: No acute abnormalities. Electronically Signed   By: Ulyses Southward M.D.   On: 11/28/2021 10:38    ____________________________________________   PROCEDURES  Procedure(s) performed:   Procedures   ____________________________________________   INITIAL IMPRESSION / ASSESSMENT AND PLAN / ED COURSE  Pertinent labs & imaging results that were available during my care of the patient were reviewed by me and considered in my medical decision making (see chart for details).   This patient is Presenting for Evaluation of CP, which does require a range of treatment options, and is a complaint that involves a high risk of morbidity and mortality.  The Differential Diagnoses includes but is not exclusive to acute coronary syndrome, aortic dissection, pulmonary embolism, cardiac tamponade, community-acquired pneumonia, pericarditis, musculoskeletal chest wall pain, etc.   Critical Interventions-    Medications  sodium chloride 0.9 % bolus 1,000 mL (has no administration in time range)  ondansetron (ZOFRAN) injection 4 mg (has no  administration in time range)    Reassessment after intervention:     I did obtain Additional Historical Information from husband at bedside.   I decided to review pertinent External Data, and in summary patient with cystectomy on 10/26 with Dr. Connye Burkitt. Outpatient follow up notes not available Deboraha Sprang).    Clinical Laboratory Tests Ordered, included ***  Radiologic Tests Ordered, included CXR and vascular US. I independently interpreted the images and agree with radiology interpretation.   Cardiac Monitor Tracing which shows NSR.   Social Determinants of Health Risk patient is a non-smoker.   Consult complete with  Medical Decision Making: Summary:  Patient presents to the emergency department for evaluation of chest pain, cough, shortness of breath after surgery.  She has been on Lovenox since the procedure.  DVT ultrasound negative for DVT and chest x-ray is clear.  Patient also describing worsening pain in her abdomen.  She has been on doxycycline for concern for wound inflammation.  Given elevated PE risk plan for CTA PE and will obtain CT abdomen pelvis given vomiting yesterday and worsening post-operative pain.   Reevaluation with update and discussion with   ***Considered admission***  Disposition:   ____________________________________________  FINAL CLINICAL IMPRESSION(S) / ED DIAGNOSES  Final diagnoses:  None     NEW OUTPATIENT MEDICATIONS STARTED DURING THIS VISIT:  New Prescriptions   No medications on file  Note:  This document was prepared using Dragon voice recognition software and may include unintentional dictation errors.  Nanda Quinton, MD, [facility] Emergency Medicine

## 2021-11-28 NOTE — ED Provider Triage Note (Signed)
Emergency Medicine Provider Triage Evaluation Note  Laura Shah , a 41 y.o. female  was evaluated in triage.  Pt complains of SOB and cough s/p right ovarian cystectomy (10/26). Passing stringy vaginal clots this AM as well. Patient with a history for DVT but not on anticoagulation prior to surgery. She has been on lovenox since the surgery as well as doxycycline with wound irritation.   Review of Systems  Positive: CP/SOB; positive vomiting last night.  Negative: Diarrhea. Fever.   Physical Exam  BP 137/80   Pulse 89   Temp 98.1 F (36.7 C) (Oral)   Resp 18   Ht 5\' 2"  (1.575 m)   Wt 68 kg   LMP 10/23/2021 (Exact Date)   SpO2 100%   BMI 27.44 kg/m  Gen:   Awake, no distress   Resp:  Normal effort  MSK:   Moves extremities without difficulty  Other:  Bruising to the abdomen at location of lovenox shots.  Medical Decision Making  Medically screening exam initiated at 10:20 AM.  Appropriate orders placed.  Laura Shah was informed that the remainder of the evaluation will be completed by another provider, this initial triage assessment does not replace that evaluation, and the importance of remaining in the ED until their evaluation is complete.  Ordered initial workup but patient may ultimately require CT imaging for PE evaluation. Not low enough risk to prompt d dimer.    Margette Fast, MD 11/28/21 1027

## 2021-11-28 NOTE — Progress Notes (Signed)
BLE venous duplex has been completed.  Preliminary results given to Dr. Laverta Baltimore.  Results can be found under chart review under CV PROC. 11/28/2021 11:38 AM Laura Shah RVT, RDMS

## 2021-11-28 NOTE — Discharge Instructions (Signed)
Your CT scan of the chest, abdomen, and pelvis along with blood work are reassuring. Please continue your antibiotics as prescribed and follow closely with your OB/Gyn. Return to the ED with any new or worsening symptoms.

## 2021-12-15 ENCOUNTER — Other Ambulatory Visit: Payer: Self-pay | Admitting: Orthopaedic Surgery

## 2021-12-15 DIAGNOSIS — M961 Postlaminectomy syndrome, not elsewhere classified: Secondary | ICD-10-CM

## 2021-12-20 ENCOUNTER — Other Ambulatory Visit: Payer: Self-pay | Admitting: Orthopaedic Surgery

## 2021-12-20 DIAGNOSIS — M5412 Radiculopathy, cervical region: Secondary | ICD-10-CM

## 2021-12-20 DIAGNOSIS — M961 Postlaminectomy syndrome, not elsewhere classified: Secondary | ICD-10-CM

## 2021-12-31 ENCOUNTER — Ambulatory Visit
Admission: RE | Admit: 2021-12-31 | Discharge: 2021-12-31 | Disposition: A | Discharge status: Home / Self Care | Payer: BC Managed Care – PPO | Source: Ambulatory Visit | Attending: Orthopaedic Surgery | Admitting: Orthopaedic Surgery

## 2021-12-31 DIAGNOSIS — M961 Postlaminectomy syndrome, not elsewhere classified: Secondary | ICD-10-CM

## 2021-12-31 DIAGNOSIS — M5412 Radiculopathy, cervical region: Secondary | ICD-10-CM

## 2022-01-01 ENCOUNTER — Other Ambulatory Visit: Payer: BC Managed Care – PPO

## 2022-05-29 ENCOUNTER — Encounter (HOSPITAL_BASED_OUTPATIENT_CLINIC_OR_DEPARTMENT_OTHER): Payer: Self-pay | Admitting: Pediatrics

## 2022-05-29 ENCOUNTER — Other Ambulatory Visit: Payer: Self-pay

## 2022-05-29 ENCOUNTER — Emergency Department (HOSPITAL_BASED_OUTPATIENT_CLINIC_OR_DEPARTMENT_OTHER)
Admission: EM | Admit: 2022-05-29 | Discharge: 2022-05-29 | Disposition: A | Discharge status: Home / Self Care | Payer: BC Managed Care – PPO | Attending: Emergency Medicine | Admitting: Emergency Medicine

## 2022-05-29 ENCOUNTER — Emergency Department (HOSPITAL_BASED_OUTPATIENT_CLINIC_OR_DEPARTMENT_OTHER): Payer: BC Managed Care – PPO

## 2022-05-29 DIAGNOSIS — R197 Diarrhea, unspecified: Secondary | ICD-10-CM | POA: Insufficient documentation

## 2022-05-29 DIAGNOSIS — R109 Unspecified abdominal pain: Secondary | ICD-10-CM | POA: Insufficient documentation

## 2022-05-29 DIAGNOSIS — R112 Nausea with vomiting, unspecified: Secondary | ICD-10-CM | POA: Diagnosis not present

## 2022-05-29 LAB — URINALYSIS, ROUTINE W REFLEX MICROSCOPIC
Glucose, UA: NEGATIVE mg/dL
Ketones, ur: NEGATIVE mg/dL
Leukocytes,Ua: NEGATIVE
Nitrite: NEGATIVE
Protein, ur: 30 mg/dL — AB
Specific Gravity, Urine: >=1.03 (ref 1.005–1.030)
pH: 5.5 (ref 5.0–8.0)

## 2022-05-29 LAB — COMPREHENSIVE METABOLIC PANEL
ALT: 17 U/L (ref 0–44)
AST: 18 U/L (ref 15–41)
Albumin: 3.8 g/dL (ref 3.5–5.0)
Alkaline Phosphatase: 81 U/L (ref 38–126)
Anion gap: 4 — ABNORMAL LOW (ref 5–15)
BUN: 8 mg/dL (ref 6–20)
CO2: 28 mmol/L (ref 22–32)
Calcium: 8.8 mg/dL — ABNORMAL LOW (ref 8.9–10.3)
Chloride: 106 mmol/L (ref 98–111)
Creatinine, Ser: 0.88 mg/dL (ref 0.44–1.00)
GFR, Estimated: >60 mL/min (ref >60)
Glucose, Bld: 100 mg/dL — ABNORMAL HIGH (ref 70–99)
Potassium: 3.7 mmol/L (ref 3.5–5.1)
Sodium: 138 mmol/L (ref 135–145)
Total Bilirubin: 0.7 mg/dL (ref 0.3–1.2)
Total Protein: 7.4 g/dL (ref 6.5–8.1)

## 2022-05-29 LAB — CBC
HCT: 38.2 % (ref 36.0–46.0)
Hemoglobin: 12.2 g/dL (ref 12.0–15.0)
MCH: 27.2 pg (ref 26.0–34.0)
MCHC: 31.9 g/dL (ref 30.0–36.0)
MCV: 85.3 fL (ref 80.0–100.0)
Platelets: 281 10*3/uL (ref 150–400)
RBC: 4.48 MIL/uL (ref 3.87–5.11)
RDW: 13.7 % (ref 11.5–15.5)
WBC: 5 10*3/uL (ref 4.0–10.5)
nRBC: 0 % (ref 0.0–0.2)

## 2022-05-29 LAB — URINALYSIS, MICROSCOPIC (REFLEX)

## 2022-05-29 LAB — LIPASE, BLOOD: Lipase: 53 U/L — ABNORMAL HIGH (ref 11–51)

## 2022-05-29 LAB — TROPONIN I (HIGH SENSITIVITY): Troponin I (High Sensitivity): <2 ng/L (ref <18)

## 2022-05-29 LAB — MAGNESIUM: Magnesium: 2 mg/dL (ref 1.7–2.4)

## 2022-05-29 LAB — PREGNANCY, URINE: Preg Test, Ur: NEGATIVE

## 2022-05-29 MED ORDER — DICYCLOMINE HCL 10 MG/ML IM SOLN
20.0000 mg | Freq: Once | INTRAMUSCULAR | Status: AC
  Administered 2022-05-29: 20 mg via INTRAMUSCULAR
  Filled 2022-05-29: qty 2

## 2022-05-29 MED ORDER — HYDROMORPHONE HCL 1 MG/ML IJ SOLN
1.0000 mg | Freq: Once | INTRAMUSCULAR | Status: AC
  Administered 2022-05-29: 1 mg via INTRAVENOUS
  Filled 2022-05-29: qty 1

## 2022-05-29 MED ORDER — DIPHENHYDRAMINE HCL 50 MG/ML IJ SOLN
25.0000 mg | Freq: Once | INTRAMUSCULAR | Status: AC
  Administered 2022-05-29: 25 mg via INTRAVENOUS
  Filled 2022-05-29: qty 1

## 2022-05-29 MED ORDER — ONDANSETRON HCL 4 MG/2ML IJ SOLN
4.0000 mg | Freq: Once | INTRAMUSCULAR | Status: AC
  Administered 2022-05-29: 4 mg via INTRAVENOUS
  Filled 2022-05-29: qty 2

## 2022-05-29 MED ORDER — PROCHLORPERAZINE EDISYLATE 10 MG/2ML IJ SOLN
10.0000 mg | Freq: Once | INTRAMUSCULAR | Status: AC
  Administered 2022-05-29: 10 mg via INTRAVENOUS
  Filled 2022-05-29: qty 2

## 2022-05-29 MED ORDER — IOHEXOL 300 MG/ML  SOLN
100.0000 mL | Freq: Once | INTRAMUSCULAR | Status: AC | PRN
  Administered 2022-05-29: 100 mL via INTRAVENOUS

## 2022-05-29 MED ORDER — LACTATED RINGERS IV BOLUS
1000.0000 mL | Freq: Once | INTRAVENOUS | Status: AC
  Administered 2022-05-29: 1000 mL via INTRAVENOUS

## 2022-05-29 MED ORDER — ONDANSETRON 4 MG PO TBDP: 4.0000 mg | ORAL_TABLET | Freq: Three times a day (TID) | ORAL | 0 refills | Status: AC | PRN

## 2022-05-29 MED ORDER — PANTOPRAZOLE SODIUM 40 MG IV SOLR
40.0000 mg | Freq: Once | INTRAVENOUS | Status: AC
  Administered 2022-05-29: 40 mg via INTRAVENOUS
  Filled 2022-05-29: qty 10

## 2022-05-29 MED ORDER — DICYCLOMINE HCL 20 MG PO TABS: 20.0000 mg | ORAL_TABLET | Freq: Two times a day (BID) | ORAL | 0 refills | Status: DC

## 2022-05-29 NOTE — ED Provider Notes (Signed)
[facility] Provider Note   CSN: [id number] Arrival date & time: 05/29/22  [id number]     History {Add pertinent medical, surgical, social history, OB history to HPI:1} Chief Complaint  Patient presents with   Diarrhea    Laura Shah is a 42 y.o. female.  HPI      Sunday diarrhea every 5 minutes, improving, now every 20 minutes instead of every .Marland Kitchen Has not had appetite, has had nausea and vomiting. Watery diarrhea, no blood Epigastric pain, chest tightness No fever, no chills Abdominal cramping, feels like pancreatitis but diarrhea not No dyspnea  Urge to urinate but only a little bit Has urgery, chronic leg and back pain, numbness in legs, no weakness,  one time was not expecting to have diarrhea and had stool  No recent travel, abx, suspicious foods Feeling lightheaded, epigastric pain  Home Medications Prior to Admission medications   Medication Sig Start Date End Date Taking? Authorizing Provider  amitriptyline (ELAVIL) 10 MG tablet Take 10 mg by mouth at bedtime.    [provider]  enoxaparin (LOVENOX) 40 MG/0.4ML injection Inject 0.4 mLs (40 mg total) into the skin daily for 10 days. 11/23/21 12/03/21  Steva Ready, DO  folic acid (FOLVITE) 1 MG tablet Take 1 tablet (1 mg total) by mouth daily. 04/14/20   Erenest Blank, NP  gabapentin (NEURONTIN) 100 MG capsule Take 100 mg by mouth at bedtime. 1-2 tablets at [facility]    [provider]  HYDROcodone-acetaminophen (NORCO) 10-325 MG tablet Take 1 tablet by mouth 3 (three) times daily as needed for moderate pain.    [provider]  ondansetron (ZOFRAN-ODT) 4 MG disintegrating tablet Take 1 tablet (4 mg total) by mouth every 8 (eight) hours as needed for nausea or vomiting. 11/28/21   Long, Arlyss Repress, MD  oxyCODONE-acetaminophen (PERCOCET/ROXICET) 5-325 MG tablet Take 1 tablet by mouth every 6 (six) hours as needed for severe pain. 11/28/21   Long,  Arlyss Repress, MD  pantoprazole (PROTONIX) 40 MG tablet Take 1 tablet (40 mg total) by mouth daily. 08/28/14   Dione Booze, MD  senna-docusate (SENOKOT-S) 8.6-50 MG tablet Take 1 tablet by mouth at bedtime as needed for mild constipation. 11/28/21   Long, Arlyss Repress, MD  vitamin B-12 (CYANOCOBALAMIN) 100 MCG tablet every 30 (thirty) days. Injection she takes monthly last dose was 11/10/2021. 11/17/2021.    [provider]  Vitamin D, Ergocalciferol, (DRISDOL) 1.25 MG (50000 UNIT) CAPS capsule Take 50,000 Units by mouth every 7 (seven) days.    [provider]      Allergies    Metoclopramide, Morphine and related, Morphine sulfate, Other, and Tape    Review of Systems   Review of Systems  Physical Exam Updated Vital Signs BP (!) 141/82 (BP Location: Left Arm)   Pulse 79   Temp 98.2 F (36.8 C) (Oral)   Resp 15   Ht 5\' 2"  (1.575 m)   Wt 68 kg   LMP 05/24/2022   SpO2 99%   BMI 27.44 kg/m  Physical Exam  ED Results / Procedures / Treatments   Labs (all labs ordered are listed, but only abnormal results are displayed) Labs Reviewed  URINALYSIS, ROUTINE W REFLEX MICROSCOPIC - Abnormal; Notable for the following components:      Result Value   Hgb urine dipstick TRACE (*)    Bilirubin Urine SMALL (*)    Protein, ur 30 (*)    All other components  within normal limits  URINALYSIS, MICROSCOPIC (REFLEX) - Abnormal; Notable for the following components:   Bacteria, UA FEW (*)    All other components within normal limits  CBC  PREGNANCY, URINE  LIPASE, BLOOD  COMPREHENSIVE METABOLIC PANEL    EKG None  Radiology No results found.  Procedures Procedures  {Document cardiac monitor, telemetry assessment procedure when appropriate:1}  Medications Ordered in ED Medications  lactated ringers bolus 1,000 mL (1,000 mLs Intravenous New Bag/Given 05/29/22 1005)    ED Course/ Medical Decision Making/ A&P   {   Click here for ABCD2, HEART and other calculatorsREFRESH  Note before signing :1}                          Medical Decision Making Amount and/or Complexity of Data Reviewed Labs: ordered.   ***  {Document critical care time when appropriate:1} {Document review of labs and clinical decision tools ie heart score, Chads2Vasc2 etc:1}  {Document your independent review of radiology images, and any outside records:1} {Document your discussion with family members, caretakers, and with consultants:1} {Document social determinants of health affecting pt's care:1} {Document your decision making why or why not admission, treatments were needed:1} Final Clinical Impression(s) / ED Diagnoses Final diagnoses:  None    Rx / DC Orders ED Discharge Orders     None

## 2022-05-29 NOTE — ED Triage Notes (Signed)
C/O diarrhea along with nausea since Sunday. Reports started Trulance last Monday for IBS-C, Reported felt like she passed out while she was asleep, stated she feels her heart fluttered during the episodes. Feels very dehydrated, unable to tolerate water. Stated has herniated disk with chronic leg and back pain and unable to tolerate pain meds.

## 2022-06-08 DIAGNOSIS — F331 Major depressive disorder, recurrent, moderate: Secondary | ICD-10-CM | POA: Diagnosis not present

## 2022-06-22 DIAGNOSIS — Z79899 Other long term (current) drug therapy: Secondary | ICD-10-CM | POA: Diagnosis not present

## 2022-06-22 DIAGNOSIS — Z3200 Encounter for pregnancy test, result unknown: Secondary | ICD-10-CM | POA: Diagnosis not present

## 2022-06-22 DIAGNOSIS — M5416 Radiculopathy, lumbar region: Secondary | ICD-10-CM | POA: Diagnosis not present

## 2022-06-22 DIAGNOSIS — G8929 Other chronic pain: Secondary | ICD-10-CM | POA: Diagnosis not present

## 2022-06-22 DIAGNOSIS — Z6828 Body mass index (BMI) 28.0-28.9, adult: Secondary | ICD-10-CM | POA: Diagnosis not present

## 2022-06-22 DIAGNOSIS — F1120 Opioid dependence, uncomplicated: Secondary | ICD-10-CM | POA: Diagnosis not present

## 2022-06-28 DIAGNOSIS — E538 Deficiency of other specified B group vitamins: Secondary | ICD-10-CM | POA: Diagnosis not present

## 2022-06-28 DIAGNOSIS — M545 Low back pain, unspecified: Secondary | ICD-10-CM | POA: Diagnosis not present

## 2022-06-28 DIAGNOSIS — F418 Other specified anxiety disorders: Secondary | ICD-10-CM | POA: Diagnosis not present

## 2022-07-06 DIAGNOSIS — F331 Major depressive disorder, recurrent, moderate: Secondary | ICD-10-CM | POA: Diagnosis not present

## 2022-07-12 DIAGNOSIS — F331 Major depressive disorder, recurrent, moderate: Secondary | ICD-10-CM | POA: Diagnosis not present

## 2022-07-17 DIAGNOSIS — F331 Major depressive disorder, recurrent, moderate: Secondary | ICD-10-CM | POA: Diagnosis not present

## 2022-07-17 DIAGNOSIS — F1120 Opioid dependence, uncomplicated: Secondary | ICD-10-CM | POA: Diagnosis not present

## 2022-07-17 DIAGNOSIS — Z79899 Other long term (current) drug therapy: Secondary | ICD-10-CM | POA: Diagnosis not present

## 2022-07-17 DIAGNOSIS — M5416 Radiculopathy, lumbar region: Secondary | ICD-10-CM | POA: Diagnosis not present

## 2022-07-17 DIAGNOSIS — Z3200 Encounter for pregnancy test, result unknown: Secondary | ICD-10-CM | POA: Diagnosis not present

## 2022-07-17 DIAGNOSIS — Z6828 Body mass index (BMI) 28.0-28.9, adult: Secondary | ICD-10-CM | POA: Diagnosis not present

## 2022-07-20 DIAGNOSIS — Z79899 Other long term (current) drug therapy: Secondary | ICD-10-CM | POA: Diagnosis not present

## 2022-07-25 ENCOUNTER — Other Ambulatory Visit: Payer: Self-pay

## 2022-07-25 ENCOUNTER — Emergency Department (HOSPITAL_BASED_OUTPATIENT_CLINIC_OR_DEPARTMENT_OTHER): Payer: BC Managed Care – PPO

## 2022-07-25 ENCOUNTER — Encounter (HOSPITAL_BASED_OUTPATIENT_CLINIC_OR_DEPARTMENT_OTHER): Payer: Self-pay | Admitting: Emergency Medicine

## 2022-07-25 ENCOUNTER — Encounter: Payer: Self-pay | Admitting: Family

## 2022-07-25 ENCOUNTER — Emergency Department (HOSPITAL_BASED_OUTPATIENT_CLINIC_OR_DEPARTMENT_OTHER)
Admission: EM | Admit: 2022-07-25 | Discharge: 2022-07-25 | Disposition: A | Discharge status: Home / Self Care | Payer: BC Managed Care – PPO | Attending: Emergency Medicine | Admitting: Emergency Medicine

## 2022-07-25 ENCOUNTER — Other Ambulatory Visit (HOSPITAL_BASED_OUTPATIENT_CLINIC_OR_DEPARTMENT_OTHER): Payer: Self-pay | Admitting: Emergency Medicine

## 2022-07-25 ENCOUNTER — Ambulatory Visit (HOSPITAL_BASED_OUTPATIENT_CLINIC_OR_DEPARTMENT_OTHER)
Admission: RE | Admit: 2022-07-25 | Discharge: 2022-07-25 | Disposition: A | Discharge status: Home / Self Care | Payer: BC Managed Care – PPO | Source: Ambulatory Visit | Attending: Emergency Medicine | Admitting: Emergency Medicine

## 2022-07-25 DIAGNOSIS — M25461 Effusion, right knee: Secondary | ICD-10-CM

## 2022-07-25 DIAGNOSIS — M79661 Pain in right lower leg: Secondary | ICD-10-CM | POA: Diagnosis not present

## 2022-07-25 DIAGNOSIS — Z86718 Personal history of other venous thrombosis and embolism: Secondary | ICD-10-CM | POA: Insufficient documentation

## 2022-07-25 DIAGNOSIS — M25561 Pain in right knee: Secondary | ICD-10-CM | POA: Diagnosis not present

## 2022-07-25 DIAGNOSIS — M47816 Spondylosis without myelopathy or radiculopathy, lumbar region: Secondary | ICD-10-CM | POA: Diagnosis not present

## 2022-07-25 DIAGNOSIS — M79604 Pain in right leg: Secondary | ICD-10-CM | POA: Insufficient documentation

## 2022-07-25 LAB — BASIC METABOLIC PANEL
Anion gap: 8 (ref 5–15)
BUN: 11 mg/dL (ref 6–20)
CO2: 25 mmol/L (ref 22–32)
Calcium: 8.7 mg/dL — ABNORMAL LOW (ref 8.9–10.3)
Chloride: 102 mmol/L (ref 98–111)
Creatinine, Ser: 0.79 mg/dL (ref 0.44–1.00)
GFR, Estimated: >60 mL/min (ref >60)
Glucose, Bld: 95 mg/dL (ref 70–99)
Potassium: 3.8 mmol/L (ref 3.5–5.1)
Sodium: 135 mmol/L (ref 135–145)

## 2022-07-25 LAB — CBC WITH DIFFERENTIAL/PLATELET
Abs Immature Granulocytes: 0.04 10*3/uL (ref 0.00–0.07)
Basophils Absolute: 0 10*3/uL (ref 0.0–0.1)
Basophils Relative: 1 %
Eosinophils Absolute: 0.2 10*3/uL (ref 0.0–0.5)
Eosinophils Relative: 2 %
HCT: 33.9 % — ABNORMAL LOW (ref 36.0–46.0)
Hemoglobin: 10.8 g/dL — ABNORMAL LOW (ref 12.0–15.0)
Immature Granulocytes: 1 %
Lymphocytes Relative: 24 %
Lymphs Abs: 1.9 10*3/uL (ref 0.7–4.0)
MCH: 26.9 pg (ref 26.0–34.0)
MCHC: 31.9 g/dL (ref 30.0–36.0)
MCV: 84.5 fL (ref 80.0–100.0)
Monocytes Absolute: 0.9 10*3/uL (ref 0.1–1.0)
Monocytes Relative: 11 %
Neutro Abs: 4.9 10*3/uL (ref 1.7–7.7)
Neutrophils Relative %: 61 %
Platelets: 276 10*3/uL (ref 150–400)
RBC: 4.01 MIL/uL (ref 3.87–5.11)
RDW: 14.4 % (ref 11.5–15.5)
WBC: 8 10*3/uL (ref 4.0–10.5)
nRBC: 0 % (ref 0.0–0.2)

## 2022-07-25 LAB — HCG, QUANTITATIVE, PREGNANCY: hCG, Beta Chain, Quant, S: <1 m[IU]/mL (ref <5)

## 2022-07-25 MED ORDER — PREDNISONE 50 MG PO TABS
60.0000 mg | ORAL_TABLET | Freq: Once | ORAL | Status: DC
  Filled 2022-07-25: qty 1

## 2022-07-25 MED ORDER — HYDROMORPHONE HCL 1 MG/ML IJ SOLN
0.5000 mg | Freq: Once | INTRAMUSCULAR | Status: AC
  Administered 2022-07-25: 0.5 mg via INTRAVENOUS
  Filled 2022-07-25: qty 1

## 2022-07-25 MED ORDER — KETOROLAC TROMETHAMINE 15 MG/ML IJ SOLN
15.0000 mg | Freq: Once | INTRAMUSCULAR | Status: DC
  Filled 2022-07-25: qty 1

## 2022-07-25 NOTE — ED Provider Notes (Signed)
[place] [facility]MENT AT [facility] Provider Note   CSN: [id number] Arrival date & time: 07/25/22  0131     History  Chief Complaint  Patient presents with   Knee Pain    Laura Shah is a 42 y.o. female.  The history is provided by the patient.   Patient with extensive history including pancreatitis, portal vein thrombosis, VTE presents with right leg pain.  Patient reports she was on a [place] cruise last week.  Towards the end of the cruise she started having right knee pain and swelling.  No trauma or falls.  She reports since that time the pain is worsening.  She is now having numbness throughout her leg.  However she does report a history of chronic back pain that does trigger pain into her leg and numbness.  She reports the pain is now in her thigh and into her calf.  No previous surgery to the right leg.  She is no longer on anticoagulation. 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 Ruptured.   OSA (obstructive sleep apnea) 03/16/2015   had sleep study done and didnt require a CPAP machine. 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 walks independently. 11/17/2021   Shortness of breath 10/27/2018   Tachycardia     Home  Medications Prior to Admission medications   Medication Sig Start Date End Date Taking? Authorizing Provider  amitriptyline (ELAVIL) 10 MG tablet Take 10 mg by mouth at bedtime.    [provider]  dicyclomine (BENTYL) 20 MG tablet Take 1 tablet (20 mg total) by mouth 2 (two) times daily. 05/29/22   Alvira Monday, MD  folic acid (FOLVITE) 1 MG tablet Take 1 tablet (1 mg total) by mouth daily. 04/14/20   Erenest Blank, NP  gabapentin (NEURONTIN) 100 MG capsule Take 100 mg by mouth at bedtime. 1-2 tablets at [facility]    [provider]  ondansetron (ZOFRAN-ODT) 4 MG disintegrating tablet Take 1 tablet (4 mg total) by mouth every 8 (eight) hours as needed for nausea or vomiting. 05/29/22   Alvira Monday, MD  pantoprazole (PROTONIX) 40 MG tablet Take 1 tablet (40 mg total) by mouth daily. 08/28/14   Dione Booze, MD  senna-docusate (SENOKOT-S) 8.6-50 MG tablet Take 1 tablet by mouth at bedtime as needed for mild constipation. 11/28/21   Long, Arlyss Repress, MD  vitamin B-12 (CYANOCOBALAMIN) 100 MCG tablet every 30 (thirty) days. Injection she takes monthly last dose was 11/10/2021. 11/17/2021.    [provider]  Vitamin D, Ergocalciferol, (DRISDOL) 1.25 MG (50000 UNIT) CAPS capsule Take 50,000 Units by mouth every 7 (seven) days.  [provider]      Allergies    Metoclopramide, Morphine and codeine, Morphine sulfate, Other, and Tape    Review of Systems   Review of Systems  Constitutional:  Negative for fever.  Cardiovascular:  Negative for chest pain.  Musculoskeletal:  Positive for arthralgias and joint swelling.    Physical Exam Updated Vital Signs BP 123/68   Pulse 85   Temp 98.1 F (36.7 C) (Oral)   Resp 18   Ht 1.575 m (5\' 2" )   Wt 68 kg   LMP 07/12/2022 (Exact Date)   SpO2 100%   BMI 27.44 kg/m  Physical Exam CONSTITUTIONAL: Well developed/well nourished, anxious HEAD: Normocephalic/atraumatic EYES: EOMI/PERRL NEURO: Pt is  awake/alert/appropriate, moves all extremitiesx4.  No facial droop.  Patient is able to lift her right lower extremity without difficulty.  She is able to flex the right knee but limited due to pain.  Patient has right ankle dorsi and plantarflexion EXTREMITIES: pulses normal/equal, full ROM No thrill noted to the popliteal fossa Patient appears to have a small effusion on exam to the right knee.  No overlying erythema or crepitus.  She has diffuse tenderness noted to the right thigh and the right calf.  Distal pulses are equal and intact.  No bruising or deformities SKIN: warm, color normal, no erythema, no rash to her right lower extremity No rash noted to the right popliteal fossa PSYCH: Anxious  ED Results / Procedures / Treatments   Labs (all labs ordered are listed, but only abnormal results are displayed) Labs Reviewed  CBC WITH DIFFERENTIAL/PLATELET - Abnormal; Notable for the following components:      Result Value   Hemoglobin 10.8 (*)    HCT 33.9 (*)    All other components within normal limits  BASIC METABOLIC PANEL - Abnormal; Notable for the following components:   Calcium 8.7 (*)    All other components within normal limits  HCG, QUANTITATIVE, PREGNANCY    EKG None  Radiology DG Knee Complete 4 Views Right  Result Date: 07/25/2022 CLINICAL DATA:  Right knee pain. EXAM: RIGHT KNEE - COMPLETE 4+ VIEW COMPARISON:  None Available. FINDINGS: No evidence of an acute fracture or dislocation. No evidence of arthropathy or other focal bone abnormality. A moderate sized joint effusion is noted. IMPRESSION: Moderate sized joint effusion. Electronically Signed   By: Aram Candela M.D.   On: 07/25/2022 02:23    Procedures Procedures    Medications Ordered in ED Medications  HYDROmorphone (DILAUDID) injection 0.5 mg (0.5 mg Intravenous Given 07/25/22 0346)    ED Course/ Medical Decision Making/ A&P Clinical Course as of 07/25/22 0441  Wed Jul 25, 2022  0401 Patient with  right knee pain and swelling.  Strong suspicion this represents gouty arthritis given recent history of food and alcohol intake on cruise.  However on exam, the effusion does not appear large enough for me to aspirate at this time.  I have low suspicion for septic arthritis [DW]  0401 Unfortunate patient has a significant history including VTE as well as previous history of gastric sleeve.  This limits her NSAID and corticosteroid use. [DW]  0401 Will plan for crutches, she already has pain meds at home, follow-up with sports medicine.  Will also obtain next day DVT ultrasound [DW]  0402 Y [DW]    Clinical Course User Index [DW] Zadie Rhine, MD  Medical Decision Making Amount and/or Complexity of Data Reviewed Labs: ordered. Radiology: ordered.  Risk Prescription drug management.   This patient presents to the ED for concern of leg pain, this involves an extensive number of treatment options, and is a complaint that carries with it a high risk of complications and morbidity.  The differential diagnosis includes but is not limited to gout, septic arthritis, cellulitis, DVT, necrotizing fasciitis, muscle strain, radiculopathy  Comorbidities that complicate the patient evaluation: Patient's presentation is complicated by their history of VTE  Social Determinants of Health: Patient was on a cruise recently admits to alcohol use  Additional history obtained: Additional history obtained from significant other   Lab Tests: I Ordered, and personally interpreted labs.  The pertinent results include: Anemia  Imaging Studies ordered: I ordered imaging studies including X-ray right knee   I independently visualized and interpreted imaging which showed knee effusion I agree with the radiologist interpretation  Medicines ordered and prescription drug management: I ordered medication including dilaudid for pain Reevaluation of the patient after these  medicines showed that the patient    improved  Reevaluation: After the interventions noted above, I reevaluated the patient and found that they have :improved  Complexity of problems addressed: Patient's presentation is most consistent with  acute presentation with potential threat to life or bodily function  Disposition: After consideration of the diagnostic results and the patient's response to treatment,  I feel that the patent would benefit from discharge   .           Final Clinical Impression(s) / ED Diagnoses Final diagnoses:  Effusion of right knee    Rx / DC Orders ED Discharge Orders          Ordered    US Venous Img Lower Unilateral Right        07/25/22 0341              Zadie Rhine, MD 07/25/22 [phone]

## 2022-07-25 NOTE — ED Triage Notes (Signed)
Pt reports right knee pain and swelling, warm to touch since returning from cruise on Thursday, denies injury, reports pain radiates down right leg to foot with decreased ROM to right foot

## 2022-07-25 NOTE — Discharge Instructions (Addendum)
Please come back at 8am for your blood clot ultrasound  As we discussed, you may have developed gout in the right knee.  Please use the crutches is much as possible, keep the leg elevated and you can ice it.  Please follow-up later this week as you may need to have your knee drained.  At this time however there is not enough fluid to take out of the knee

## 2022-07-26 DIAGNOSIS — F331 Major depressive disorder, recurrent, moderate: Secondary | ICD-10-CM | POA: Diagnosis not present

## 2022-08-04 DIAGNOSIS — M5117 Intervertebral disc disorders with radiculopathy, lumbosacral region: Secondary | ICD-10-CM | POA: Diagnosis not present

## 2022-08-04 DIAGNOSIS — M9953 Intervertebral disc stenosis of neural canal of lumbar region: Secondary | ICD-10-CM | POA: Diagnosis not present

## 2022-08-22 DIAGNOSIS — Z79899 Other long term (current) drug therapy: Secondary | ICD-10-CM | POA: Diagnosis not present

## 2022-08-22 DIAGNOSIS — Z131 Encounter for screening for diabetes mellitus: Secondary | ICD-10-CM | POA: Diagnosis not present

## 2022-08-22 DIAGNOSIS — Z6828 Body mass index (BMI) 28.0-28.9, adult: Secondary | ICD-10-CM | POA: Diagnosis not present

## 2022-08-22 DIAGNOSIS — F1120 Opioid dependence, uncomplicated: Secondary | ICD-10-CM | POA: Diagnosis not present

## 2022-08-22 DIAGNOSIS — G8929 Other chronic pain: Secondary | ICD-10-CM | POA: Diagnosis not present

## 2022-08-22 DIAGNOSIS — M5416 Radiculopathy, lumbar region: Secondary | ICD-10-CM | POA: Diagnosis not present

## 2022-08-22 DIAGNOSIS — Z3200 Encounter for pregnancy test, result unknown: Secondary | ICD-10-CM | POA: Diagnosis not present

## 2022-08-23 DIAGNOSIS — M47816 Spondylosis without myelopathy or radiculopathy, lumbar region: Secondary | ICD-10-CM | POA: Diagnosis not present

## 2022-08-23 DIAGNOSIS — M961 Postlaminectomy syndrome, not elsewhere classified: Secondary | ICD-10-CM | POA: Diagnosis not present

## 2022-08-27 DIAGNOSIS — Z6829 Body mass index (BMI) 29.0-29.9, adult: Secondary | ICD-10-CM | POA: Diagnosis not present

## 2022-08-27 DIAGNOSIS — E538 Deficiency of other specified B group vitamins: Secondary | ICD-10-CM | POA: Diagnosis not present

## 2022-08-27 DIAGNOSIS — F418 Other specified anxiety disorders: Secondary | ICD-10-CM | POA: Diagnosis not present

## 2022-08-31 DIAGNOSIS — F331 Major depressive disorder, recurrent, moderate: Secondary | ICD-10-CM | POA: Diagnosis not present

## 2022-09-03 DIAGNOSIS — E538 Deficiency of other specified B group vitamins: Secondary | ICD-10-CM | POA: Diagnosis not present

## 2022-09-10 DIAGNOSIS — E538 Deficiency of other specified B group vitamins: Secondary | ICD-10-CM | POA: Diagnosis not present

## 2022-09-11 DIAGNOSIS — M5416 Radiculopathy, lumbar region: Secondary | ICD-10-CM | POA: Diagnosis not present

## 2022-09-13 DIAGNOSIS — M47812 Spondylosis without myelopathy or radiculopathy, cervical region: Secondary | ICD-10-CM | POA: Diagnosis not present

## 2022-09-13 DIAGNOSIS — M5030 Other cervical disc degeneration, unspecified cervical region: Secondary | ICD-10-CM | POA: Diagnosis not present

## 2022-09-13 DIAGNOSIS — M5412 Radiculopathy, cervical region: Secondary | ICD-10-CM | POA: Diagnosis not present

## 2022-09-17 DIAGNOSIS — E611 Iron deficiency: Secondary | ICD-10-CM | POA: Diagnosis not present

## 2022-09-17 DIAGNOSIS — F418 Other specified anxiety disorders: Secondary | ICD-10-CM | POA: Diagnosis not present

## 2022-09-17 DIAGNOSIS — Z0000 Encounter for general adult medical examination without abnormal findings: Secondary | ICD-10-CM | POA: Diagnosis not present

## 2022-09-17 DIAGNOSIS — E538 Deficiency of other specified B group vitamins: Secondary | ICD-10-CM | POA: Diagnosis not present

## 2022-09-21 DIAGNOSIS — Z6828 Body mass index (BMI) 28.0-28.9, adult: Secondary | ICD-10-CM | POA: Diagnosis not present

## 2022-09-21 DIAGNOSIS — Z3200 Encounter for pregnancy test, result unknown: Secondary | ICD-10-CM | POA: Diagnosis not present

## 2022-09-21 DIAGNOSIS — F1120 Opioid dependence, uncomplicated: Secondary | ICD-10-CM | POA: Diagnosis not present

## 2022-09-21 DIAGNOSIS — M5416 Radiculopathy, lumbar region: Secondary | ICD-10-CM | POA: Diagnosis not present

## 2022-09-21 DIAGNOSIS — Z6829 Body mass index (BMI) 29.0-29.9, adult: Secondary | ICD-10-CM | POA: Diagnosis not present

## 2022-09-21 DIAGNOSIS — M129 Arthropathy, unspecified: Secondary | ICD-10-CM | POA: Diagnosis not present

## 2022-09-21 DIAGNOSIS — Z79899 Other long term (current) drug therapy: Secondary | ICD-10-CM | POA: Diagnosis not present

## 2022-09-24 DIAGNOSIS — M961 Postlaminectomy syndrome, not elsewhere classified: Secondary | ICD-10-CM | POA: Diagnosis not present

## 2022-09-24 DIAGNOSIS — M47816 Spondylosis without myelopathy or radiculopathy, lumbar region: Secondary | ICD-10-CM | POA: Diagnosis not present

## 2022-09-25 DIAGNOSIS — Z79899 Other long term (current) drug therapy: Secondary | ICD-10-CM | POA: Diagnosis not present

## 2022-09-25 DIAGNOSIS — E538 Deficiency of other specified B group vitamins: Secondary | ICD-10-CM | POA: Diagnosis not present

## 2022-09-28 DIAGNOSIS — F331 Major depressive disorder, recurrent, moderate: Secondary | ICD-10-CM | POA: Diagnosis not present

## 2022-09-29 DIAGNOSIS — Z888 Allergy status to other drugs, medicaments and biological substances status: Secondary | ICD-10-CM | POA: Diagnosis not present

## 2022-09-29 DIAGNOSIS — M5441 Lumbago with sciatica, right side: Secondary | ICD-10-CM | POA: Diagnosis not present

## 2022-09-29 DIAGNOSIS — G8929 Other chronic pain: Secondary | ICD-10-CM | POA: Diagnosis not present

## 2022-09-29 DIAGNOSIS — R519 Headache, unspecified: Secondary | ICD-10-CM | POA: Diagnosis not present

## 2022-09-29 DIAGNOSIS — R200 Anesthesia of skin: Secondary | ICD-10-CM | POA: Diagnosis not present

## 2022-09-29 DIAGNOSIS — M6281 Muscle weakness (generalized): Secondary | ICD-10-CM | POA: Diagnosis not present

## 2022-10-02 DIAGNOSIS — E538 Deficiency of other specified B group vitamins: Secondary | ICD-10-CM | POA: Diagnosis not present

## 2022-10-03 DIAGNOSIS — M21371 Foot drop, right foot: Secondary | ICD-10-CM | POA: Diagnosis not present

## 2022-10-03 DIAGNOSIS — Z981 Arthrodesis status: Secondary | ICD-10-CM | POA: Diagnosis not present

## 2022-10-03 DIAGNOSIS — M5136 Other intervertebral disc degeneration, lumbar region: Secondary | ICD-10-CM | POA: Diagnosis not present

## 2022-10-09 DIAGNOSIS — E538 Deficiency of other specified B group vitamins: Secondary | ICD-10-CM | POA: Diagnosis not present

## 2022-10-12 DIAGNOSIS — Z79899 Other long term (current) drug therapy: Secondary | ICD-10-CM | POA: Diagnosis not present

## 2022-10-16 DIAGNOSIS — E538 Deficiency of other specified B group vitamins: Secondary | ICD-10-CM | POA: Diagnosis not present

## 2022-10-18 DIAGNOSIS — Z981 Arthrodesis status: Secondary | ICD-10-CM | POA: Diagnosis not present

## 2022-10-18 DIAGNOSIS — M5116 Intervertebral disc disorders with radiculopathy, lumbar region: Secondary | ICD-10-CM | POA: Diagnosis not present

## 2022-10-26 DIAGNOSIS — F331 Major depressive disorder, recurrent, moderate: Secondary | ICD-10-CM | POA: Diagnosis not present

## 2022-11-12 DIAGNOSIS — G8929 Other chronic pain: Secondary | ICD-10-CM | POA: Diagnosis not present

## 2022-11-12 DIAGNOSIS — M545 Low back pain, unspecified: Secondary | ICD-10-CM | POA: Diagnosis not present

## 2022-11-12 DIAGNOSIS — Z79899 Other long term (current) drug therapy: Secondary | ICD-10-CM | POA: Diagnosis not present

## 2022-11-12 DIAGNOSIS — M21372 Foot drop, left foot: Secondary | ICD-10-CM | POA: Diagnosis not present

## 2022-11-12 DIAGNOSIS — M5432 Sciatica, left side: Secondary | ICD-10-CM | POA: Diagnosis not present

## 2022-12-10 DIAGNOSIS — R21 Rash and other nonspecific skin eruption: Secondary | ICD-10-CM | POA: Diagnosis not present

## 2022-12-14 DIAGNOSIS — F331 Major depressive disorder, recurrent, moderate: Secondary | ICD-10-CM | POA: Diagnosis not present

## 2022-12-17 DIAGNOSIS — M545 Low back pain, unspecified: Secondary | ICD-10-CM | POA: Diagnosis not present

## 2022-12-17 DIAGNOSIS — G8929 Other chronic pain: Secondary | ICD-10-CM | POA: Diagnosis not present

## 2022-12-17 DIAGNOSIS — M5431 Sciatica, right side: Secondary | ICD-10-CM | POA: Diagnosis not present

## 2022-12-17 DIAGNOSIS — Z79899 Other long term (current) drug therapy: Secondary | ICD-10-CM | POA: Diagnosis not present

## 2022-12-17 DIAGNOSIS — M21371 Foot drop, right foot: Secondary | ICD-10-CM | POA: Diagnosis not present

## 2022-12-21 DIAGNOSIS — F331 Major depressive disorder, recurrent, moderate: Secondary | ICD-10-CM | POA: Diagnosis not present

## 2022-12-24 DIAGNOSIS — M5116 Intervertebral disc disorders with radiculopathy, lumbar region: Secondary | ICD-10-CM | POA: Diagnosis not present

## 2022-12-24 DIAGNOSIS — Z981 Arthrodesis status: Secondary | ICD-10-CM | POA: Diagnosis not present

## 2022-12-24 DIAGNOSIS — Z419 Encounter for procedure for purposes other than remedying health state, unspecified: Secondary | ICD-10-CM | POA: Diagnosis not present

## 2022-12-24 DIAGNOSIS — M4726 Other spondylosis with radiculopathy, lumbar region: Secondary | ICD-10-CM | POA: Diagnosis not present

## 2022-12-24 DIAGNOSIS — Z6829 Body mass index (BMI) 29.0-29.9, adult: Secondary | ICD-10-CM | POA: Diagnosis not present

## 2022-12-31 DIAGNOSIS — M4807 Spinal stenosis, lumbosacral region: Secondary | ICD-10-CM | POA: Diagnosis not present

## 2022-12-31 DIAGNOSIS — M4727 Other spondylosis with radiculopathy, lumbosacral region: Secondary | ICD-10-CM | POA: Diagnosis not present

## 2022-12-31 DIAGNOSIS — Z981 Arthrodesis status: Secondary | ICD-10-CM | POA: Diagnosis not present

## 2022-12-31 DIAGNOSIS — Z91040 Latex allergy status: Secondary | ICD-10-CM | POA: Diagnosis not present

## 2022-12-31 DIAGNOSIS — M4726 Other spondylosis with radiculopathy, lumbar region: Secondary | ICD-10-CM | POA: Diagnosis not present

## 2022-12-31 DIAGNOSIS — Z86718 Personal history of other venous thrombosis and embolism: Secondary | ICD-10-CM | POA: Diagnosis not present

## 2022-12-31 DIAGNOSIS — M5117 Intervertebral disc disorders with radiculopathy, lumbosacral region: Secondary | ICD-10-CM | POA: Diagnosis not present

## 2022-12-31 DIAGNOSIS — Z9109 Other allergy status, other than to drugs and biological substances: Secondary | ICD-10-CM | POA: Diagnosis not present

## 2022-12-31 DIAGNOSIS — M545 Low back pain, unspecified: Secondary | ICD-10-CM | POA: Diagnosis not present

## 2022-12-31 DIAGNOSIS — Z885 Allergy status to narcotic agent status: Secondary | ICD-10-CM | POA: Diagnosis not present

## 2022-12-31 DIAGNOSIS — Z9689 Presence of other specified functional implants: Secondary | ICD-10-CM | POA: Diagnosis not present

## 2022-12-31 DIAGNOSIS — M5116 Intervertebral disc disorders with radiculopathy, lumbar region: Secondary | ICD-10-CM | POA: Diagnosis not present

## 2022-12-31 DIAGNOSIS — M48062 Spinal stenosis, lumbar region with neurogenic claudication: Secondary | ICD-10-CM | POA: Diagnosis not present

## 2022-12-31 DIAGNOSIS — M532X6 Spinal instabilities, lumbar region: Secondary | ICD-10-CM | POA: Diagnosis not present

## 2022-12-31 DIAGNOSIS — Z79899 Other long term (current) drug therapy: Secondary | ICD-10-CM | POA: Diagnosis not present

## 2022-12-31 DIAGNOSIS — M960 Pseudarthrosis after fusion or arthrodesis: Secondary | ICD-10-CM | POA: Diagnosis not present

## 2022-12-31 DIAGNOSIS — Z793 Long term (current) use of hormonal contraceptives: Secondary | ICD-10-CM | POA: Diagnosis not present

## 2022-12-31 DIAGNOSIS — Z888 Allergy status to other drugs, medicaments and biological substances status: Secondary | ICD-10-CM | POA: Diagnosis not present

## 2022-12-31 HISTORY — PX: POSTERIOR LUMBAR FUSION: SHX6036

## 2023-01-01 DIAGNOSIS — Z885 Allergy status to narcotic agent status: Secondary | ICD-10-CM | POA: Diagnosis not present

## 2023-01-01 DIAGNOSIS — Z91040 Latex allergy status: Secondary | ICD-10-CM | POA: Diagnosis not present

## 2023-01-01 DIAGNOSIS — M532X6 Spinal instabilities, lumbar region: Secondary | ICD-10-CM | POA: Diagnosis not present

## 2023-01-01 DIAGNOSIS — Z86718 Personal history of other venous thrombosis and embolism: Secondary | ICD-10-CM | POA: Diagnosis not present

## 2023-01-01 DIAGNOSIS — Z793 Long term (current) use of hormonal contraceptives: Secondary | ICD-10-CM | POA: Diagnosis not present

## 2023-01-01 DIAGNOSIS — M960 Pseudarthrosis after fusion or arthrodesis: Secondary | ICD-10-CM | POA: Diagnosis not present

## 2023-01-01 DIAGNOSIS — Z79899 Other long term (current) drug therapy: Secondary | ICD-10-CM | POA: Diagnosis not present

## 2023-01-01 DIAGNOSIS — M5117 Intervertebral disc disorders with radiculopathy, lumbosacral region: Secondary | ICD-10-CM | POA: Diagnosis not present

## 2023-01-01 DIAGNOSIS — Z981 Arthrodesis status: Secondary | ICD-10-CM | POA: Diagnosis not present

## 2023-01-01 DIAGNOSIS — M4727 Other spondylosis with radiculopathy, lumbosacral region: Secondary | ICD-10-CM | POA: Diagnosis not present

## 2023-01-01 DIAGNOSIS — Z9109 Other allergy status, other than to drugs and biological substances: Secondary | ICD-10-CM | POA: Diagnosis not present

## 2023-01-01 DIAGNOSIS — Z888 Allergy status to other drugs, medicaments and biological substances status: Secondary | ICD-10-CM | POA: Diagnosis not present

## 2023-01-01 DIAGNOSIS — M4807 Spinal stenosis, lumbosacral region: Secondary | ICD-10-CM | POA: Diagnosis not present

## 2023-01-01 DIAGNOSIS — M545 Low back pain, unspecified: Secondary | ICD-10-CM | POA: Diagnosis not present

## 2023-01-02 DIAGNOSIS — Z79899 Other long term (current) drug therapy: Secondary | ICD-10-CM | POA: Diagnosis not present

## 2023-01-02 DIAGNOSIS — M532X6 Spinal instabilities, lumbar region: Secondary | ICD-10-CM | POA: Diagnosis not present

## 2023-01-02 DIAGNOSIS — Z9109 Other allergy status, other than to drugs and biological substances: Secondary | ICD-10-CM | POA: Diagnosis not present

## 2023-01-02 DIAGNOSIS — Z86718 Personal history of other venous thrombosis and embolism: Secondary | ICD-10-CM | POA: Diagnosis not present

## 2023-01-02 DIAGNOSIS — M5117 Intervertebral disc disorders with radiculopathy, lumbosacral region: Secondary | ICD-10-CM | POA: Diagnosis not present

## 2023-01-02 DIAGNOSIS — Z793 Long term (current) use of hormonal contraceptives: Secondary | ICD-10-CM | POA: Diagnosis not present

## 2023-01-02 DIAGNOSIS — Z885 Allergy status to narcotic agent status: Secondary | ICD-10-CM | POA: Diagnosis not present

## 2023-01-02 DIAGNOSIS — Z888 Allergy status to other drugs, medicaments and biological substances status: Secondary | ICD-10-CM | POA: Diagnosis not present

## 2023-01-02 DIAGNOSIS — M4807 Spinal stenosis, lumbosacral region: Secondary | ICD-10-CM | POA: Diagnosis not present

## 2023-01-02 DIAGNOSIS — Z981 Arthrodesis status: Secondary | ICD-10-CM | POA: Diagnosis not present

## 2023-01-02 DIAGNOSIS — M960 Pseudarthrosis after fusion or arthrodesis: Secondary | ICD-10-CM | POA: Diagnosis not present

## 2023-01-02 DIAGNOSIS — M545 Low back pain, unspecified: Secondary | ICD-10-CM | POA: Diagnosis not present

## 2023-01-02 DIAGNOSIS — M4727 Other spondylosis with radiculopathy, lumbosacral region: Secondary | ICD-10-CM | POA: Diagnosis not present

## 2023-01-02 DIAGNOSIS — Z91040 Latex allergy status: Secondary | ICD-10-CM | POA: Diagnosis not present

## 2023-01-03 DIAGNOSIS — R2243 Localized swelling, mass and lump, lower limb, bilateral: Secondary | ICD-10-CM | POA: Diagnosis not present

## 2023-01-03 DIAGNOSIS — Z888 Allergy status to other drugs, medicaments and biological substances status: Secondary | ICD-10-CM | POA: Diagnosis not present

## 2023-01-03 DIAGNOSIS — M5117 Intervertebral disc disorders with radiculopathy, lumbosacral region: Secondary | ICD-10-CM | POA: Diagnosis not present

## 2023-01-03 DIAGNOSIS — Z91040 Latex allergy status: Secondary | ICD-10-CM | POA: Diagnosis not present

## 2023-01-03 DIAGNOSIS — Z793 Long term (current) use of hormonal contraceptives: Secondary | ICD-10-CM | POA: Diagnosis not present

## 2023-01-03 DIAGNOSIS — M545 Low back pain, unspecified: Secondary | ICD-10-CM | POA: Diagnosis not present

## 2023-01-03 DIAGNOSIS — M4807 Spinal stenosis, lumbosacral region: Secondary | ICD-10-CM | POA: Diagnosis not present

## 2023-01-03 DIAGNOSIS — Z981 Arthrodesis status: Secondary | ICD-10-CM | POA: Diagnosis not present

## 2023-01-03 DIAGNOSIS — Z885 Allergy status to narcotic agent status: Secondary | ICD-10-CM | POA: Diagnosis not present

## 2023-01-03 DIAGNOSIS — M4727 Other spondylosis with radiculopathy, lumbosacral region: Secondary | ICD-10-CM | POA: Diagnosis not present

## 2023-01-03 DIAGNOSIS — Z79899 Other long term (current) drug therapy: Secondary | ICD-10-CM | POA: Diagnosis not present

## 2023-01-03 DIAGNOSIS — Z86718 Personal history of other venous thrombosis and embolism: Secondary | ICD-10-CM | POA: Diagnosis not present

## 2023-01-03 DIAGNOSIS — M960 Pseudarthrosis after fusion or arthrodesis: Secondary | ICD-10-CM | POA: Diagnosis not present

## 2023-01-03 DIAGNOSIS — M532X6 Spinal instabilities, lumbar region: Secondary | ICD-10-CM | POA: Diagnosis not present

## 2023-01-03 DIAGNOSIS — Z9109 Other allergy status, other than to drugs and biological substances: Secondary | ICD-10-CM | POA: Diagnosis not present

## 2023-01-05 DIAGNOSIS — R079 Chest pain, unspecified: Secondary | ICD-10-CM | POA: Diagnosis not present

## 2023-01-05 DIAGNOSIS — R1310 Dysphagia, unspecified: Secondary | ICD-10-CM | POA: Diagnosis not present

## 2023-01-05 DIAGNOSIS — R0989 Other specified symptoms and signs involving the circulatory and respiratory systems: Secondary | ICD-10-CM | POA: Diagnosis not present

## 2023-01-05 DIAGNOSIS — M549 Dorsalgia, unspecified: Secondary | ICD-10-CM | POA: Diagnosis not present

## 2023-01-05 DIAGNOSIS — R09A2 Foreign body sensation, throat: Secondary | ICD-10-CM | POA: Diagnosis not present

## 2023-01-05 DIAGNOSIS — F458 Other somatoform disorders: Secondary | ICD-10-CM | POA: Diagnosis not present

## 2023-01-05 DIAGNOSIS — J029 Acute pharyngitis, unspecified: Secondary | ICD-10-CM | POA: Diagnosis not present

## 2023-01-06 DIAGNOSIS — R079 Chest pain, unspecified: Secondary | ICD-10-CM | POA: Diagnosis not present

## 2023-01-16 DIAGNOSIS — K5909 Other constipation: Secondary | ICD-10-CM | POA: Diagnosis not present

## 2023-01-17 DIAGNOSIS — G8929 Other chronic pain: Secondary | ICD-10-CM | POA: Diagnosis not present

## 2023-01-17 DIAGNOSIS — M21371 Foot drop, right foot: Secondary | ICD-10-CM | POA: Diagnosis not present

## 2023-01-17 DIAGNOSIS — M545 Low back pain, unspecified: Secondary | ICD-10-CM | POA: Diagnosis not present

## 2023-01-17 DIAGNOSIS — M5431 Sciatica, right side: Secondary | ICD-10-CM | POA: Diagnosis not present

## 2023-01-17 DIAGNOSIS — Z79899 Other long term (current) drug therapy: Secondary | ICD-10-CM | POA: Diagnosis not present

## 2023-01-18 DIAGNOSIS — F331 Major depressive disorder, recurrent, moderate: Secondary | ICD-10-CM | POA: Diagnosis not present

## 2023-01-25 DIAGNOSIS — F331 Major depressive disorder, recurrent, moderate: Secondary | ICD-10-CM | POA: Diagnosis not present

## 2023-01-28 ENCOUNTER — Encounter: Payer: Self-pay | Admitting: Family

## 2023-01-29 DIAGNOSIS — M5416 Radiculopathy, lumbar region: Secondary | ICD-10-CM | POA: Diagnosis not present

## 2023-02-15 DIAGNOSIS — F331 Major depressive disorder, recurrent, moderate: Secondary | ICD-10-CM | POA: Diagnosis not present

## 2023-02-18 DIAGNOSIS — Z79899 Other long term (current) drug therapy: Secondary | ICD-10-CM | POA: Diagnosis not present

## 2023-02-18 DIAGNOSIS — G8929 Other chronic pain: Secondary | ICD-10-CM | POA: Diagnosis not present

## 2023-02-18 DIAGNOSIS — M545 Low back pain, unspecified: Secondary | ICD-10-CM | POA: Diagnosis not present

## 2023-02-18 DIAGNOSIS — M5431 Sciatica, right side: Secondary | ICD-10-CM | POA: Diagnosis not present

## 2023-02-20 DIAGNOSIS — K6289 Other specified diseases of anus and rectum: Secondary | ICD-10-CM | POA: Diagnosis not present

## 2023-02-20 DIAGNOSIS — K5909 Other constipation: Secondary | ICD-10-CM | POA: Diagnosis not present

## 2023-02-21 ENCOUNTER — Other Ambulatory Visit: Payer: Self-pay | Admitting: Nurse Practitioner

## 2023-02-21 DIAGNOSIS — Z113 Encounter for screening for infections with a predominantly sexual mode of transmission: Secondary | ICD-10-CM | POA: Diagnosis not present

## 2023-02-21 DIAGNOSIS — N949 Unspecified condition associated with female genital organs and menstrual cycle: Secondary | ICD-10-CM | POA: Diagnosis not present

## 2023-02-21 DIAGNOSIS — Z1231 Encounter for screening mammogram for malignant neoplasm of breast: Secondary | ICD-10-CM

## 2023-02-21 DIAGNOSIS — N9412 Deep dyspareunia: Secondary | ICD-10-CM | POA: Diagnosis not present

## 2023-02-21 DIAGNOSIS — R102 Pelvic and perineal pain: Secondary | ICD-10-CM | POA: Diagnosis not present

## 2023-02-21 DIAGNOSIS — Z01419 Encounter for gynecological examination (general) (routine) without abnormal findings: Secondary | ICD-10-CM | POA: Diagnosis not present

## 2023-02-22 DIAGNOSIS — F331 Major depressive disorder, recurrent, moderate: Secondary | ICD-10-CM | POA: Diagnosis not present

## 2023-02-28 DIAGNOSIS — Z30430 Encounter for insertion of intrauterine contraceptive device: Secondary | ICD-10-CM | POA: Diagnosis not present

## 2023-02-28 DIAGNOSIS — D259 Leiomyoma of uterus, unspecified: Secondary | ICD-10-CM | POA: Diagnosis not present

## 2023-02-28 DIAGNOSIS — N83201 Unspecified ovarian cyst, right side: Secondary | ICD-10-CM | POA: Diagnosis not present

## 2023-02-28 DIAGNOSIS — N949 Unspecified condition associated with female genital organs and menstrual cycle: Secondary | ICD-10-CM | POA: Diagnosis not present

## 2023-02-28 DIAGNOSIS — N9412 Deep dyspareunia: Secondary | ICD-10-CM | POA: Diagnosis not present

## 2023-03-04 DIAGNOSIS — F526 Dyspareunia not due to a substance or known physiological condition: Secondary | ICD-10-CM | POA: Diagnosis not present

## 2023-03-04 DIAGNOSIS — D219 Benign neoplasm of connective and other soft tissue, unspecified: Secondary | ICD-10-CM | POA: Diagnosis not present

## 2023-03-04 DIAGNOSIS — N920 Excessive and frequent menstruation with regular cycle: Secondary | ICD-10-CM | POA: Diagnosis not present

## 2023-03-04 DIAGNOSIS — N946 Dysmenorrhea, unspecified: Secondary | ICD-10-CM | POA: Diagnosis not present

## 2023-03-05 DIAGNOSIS — Z1211 Encounter for screening for malignant neoplasm of colon: Secondary | ICD-10-CM | POA: Diagnosis not present

## 2023-03-05 DIAGNOSIS — K6289 Other specified diseases of anus and rectum: Secondary | ICD-10-CM | POA: Diagnosis not present

## 2023-03-07 ENCOUNTER — Ambulatory Visit
Admission: RE | Admit: 2023-03-07 | Discharge: 2023-03-07 | Disposition: A | Discharge status: Home / Self Care | Payer: BC Managed Care – PPO | Source: Ambulatory Visit | Attending: Nurse Practitioner | Admitting: Nurse Practitioner

## 2023-03-07 DIAGNOSIS — Z1231 Encounter for screening mammogram for malignant neoplasm of breast: Secondary | ICD-10-CM | POA: Diagnosis not present

## 2023-03-09 DIAGNOSIS — R0981 Nasal congestion: Secondary | ICD-10-CM | POA: Diagnosis not present

## 2023-03-09 DIAGNOSIS — R0789 Other chest pain: Secondary | ICD-10-CM | POA: Diagnosis not present

## 2023-03-09 DIAGNOSIS — R52 Pain, unspecified: Secondary | ICD-10-CM | POA: Diagnosis not present

## 2023-03-09 DIAGNOSIS — R051 Acute cough: Secondary | ICD-10-CM | POA: Diagnosis not present

## 2023-03-09 DIAGNOSIS — R509 Fever, unspecified: Secondary | ICD-10-CM | POA: Diagnosis not present

## 2023-03-14 DIAGNOSIS — K529 Noninfective gastroenteritis and colitis, unspecified: Secondary | ICD-10-CM | POA: Diagnosis not present

## 2023-03-20 DIAGNOSIS — R030 Elevated blood-pressure reading, without diagnosis of hypertension: Secondary | ICD-10-CM | POA: Diagnosis not present

## 2023-03-20 DIAGNOSIS — M5416 Radiculopathy, lumbar region: Secondary | ICD-10-CM | POA: Diagnosis not present

## 2023-03-20 DIAGNOSIS — H10023 Other mucopurulent conjunctivitis, bilateral: Secondary | ICD-10-CM | POA: Diagnosis not present

## 2023-03-20 DIAGNOSIS — Z6828 Body mass index (BMI) 28.0-28.9, adult: Secondary | ICD-10-CM | POA: Diagnosis not present

## 2023-03-22 ENCOUNTER — Other Ambulatory Visit: Payer: Self-pay | Admitting: Orthopaedic Surgery

## 2023-03-22 DIAGNOSIS — M5412 Radiculopathy, cervical region: Secondary | ICD-10-CM

## 2023-03-25 ENCOUNTER — Encounter: Payer: Self-pay | Admitting: Family

## 2023-03-25 ENCOUNTER — Other Ambulatory Visit (HOSPITAL_BASED_OUTPATIENT_CLINIC_OR_DEPARTMENT_OTHER): Payer: Self-pay

## 2023-03-25 MED ORDER — OXYCODONE HCL 15 MG PO TABS
15.0000 mg | ORAL_TABLET | ORAL | 0 refills | Status: DC | PRN
  Filled 2023-03-25 – 2023-07-19 (×3): qty 55, 10d supply, fill #0

## 2023-03-29 ENCOUNTER — Ambulatory Visit
Admission: RE | Admit: 2023-03-29 | Discharge: 2023-03-29 | Disposition: A | Discharge status: Home / Self Care | Payer: BC Managed Care – PPO | Source: Ambulatory Visit | Attending: Orthopaedic Surgery | Admitting: Orthopaedic Surgery

## 2023-03-29 ENCOUNTER — Other Ambulatory Visit (HOSPITAL_COMMUNITY): Payer: Self-pay

## 2023-03-29 DIAGNOSIS — R42 Dizziness and giddiness: Secondary | ICD-10-CM | POA: Diagnosis not present

## 2023-03-29 DIAGNOSIS — M5431 Sciatica, right side: Secondary | ICD-10-CM | POA: Diagnosis not present

## 2023-03-29 DIAGNOSIS — M47812 Spondylosis without myelopathy or radiculopathy, cervical region: Secondary | ICD-10-CM | POA: Diagnosis not present

## 2023-03-29 DIAGNOSIS — M50222 Other cervical disc displacement at C5-C6 level: Secondary | ICD-10-CM | POA: Diagnosis not present

## 2023-03-29 DIAGNOSIS — M545 Low back pain, unspecified: Secondary | ICD-10-CM | POA: Diagnosis not present

## 2023-03-29 DIAGNOSIS — M5021 Other cervical disc displacement,  high cervical region: Secondary | ICD-10-CM | POA: Diagnosis not present

## 2023-03-29 DIAGNOSIS — R002 Palpitations: Secondary | ICD-10-CM | POA: Diagnosis not present

## 2023-03-29 DIAGNOSIS — M5412 Radiculopathy, cervical region: Secondary | ICD-10-CM

## 2023-03-29 DIAGNOSIS — M50221 Other cervical disc displacement at C4-C5 level: Secondary | ICD-10-CM | POA: Diagnosis not present

## 2023-03-29 DIAGNOSIS — Z79899 Other long term (current) drug therapy: Secondary | ICD-10-CM | POA: Diagnosis not present

## 2023-03-29 DIAGNOSIS — G8929 Other chronic pain: Secondary | ICD-10-CM | POA: Diagnosis not present

## 2023-03-29 DIAGNOSIS — I959 Hypotension, unspecified: Secondary | ICD-10-CM | POA: Diagnosis not present

## 2023-03-29 MED ORDER — OXYCODONE HCL 15 MG PO TABS
7.5000 mg | ORAL_TABLET | Freq: Four times a day (QID) | ORAL | 0 refills | Status: DC | PRN
  Filled 2023-03-29: qty 120, 30d supply, fill #0

## 2023-03-30 DIAGNOSIS — R55 Syncope and collapse: Secondary | ICD-10-CM | POA: Diagnosis not present

## 2023-03-30 DIAGNOSIS — J9859 Other diseases of mediastinum, not elsewhere classified: Secondary | ICD-10-CM | POA: Diagnosis not present

## 2023-03-30 DIAGNOSIS — R0789 Other chest pain: Secondary | ICD-10-CM | POA: Diagnosis not present

## 2023-03-30 DIAGNOSIS — K2900 Acute gastritis without bleeding: Secondary | ICD-10-CM | POA: Diagnosis not present

## 2023-03-30 DIAGNOSIS — R791 Abnormal coagulation profile: Secondary | ICD-10-CM | POA: Diagnosis not present

## 2023-03-30 DIAGNOSIS — T40415A Adverse effect of fentanyl or fentanyl analogs, initial encounter: Secondary | ICD-10-CM | POA: Diagnosis not present

## 2023-03-30 DIAGNOSIS — R7989 Other specified abnormal findings of blood chemistry: Secondary | ICD-10-CM | POA: Diagnosis not present

## 2023-03-30 DIAGNOSIS — R251 Tremor, unspecified: Secondary | ICD-10-CM | POA: Diagnosis not present

## 2023-03-30 DIAGNOSIS — L299 Pruritus, unspecified: Secondary | ICD-10-CM | POA: Diagnosis not present

## 2023-03-30 DIAGNOSIS — R079 Chest pain, unspecified: Secondary | ICD-10-CM | POA: Diagnosis not present

## 2023-03-30 DIAGNOSIS — Y92239 Unspecified place in hospital as the place of occurrence of the external cause: Secondary | ICD-10-CM | POA: Diagnosis not present

## 2023-03-30 DIAGNOSIS — R110 Nausea: Secondary | ICD-10-CM | POA: Diagnosis not present

## 2023-03-30 DIAGNOSIS — K209 Esophagitis, unspecified without bleeding: Secondary | ICD-10-CM | POA: Diagnosis not present

## 2023-03-31 DIAGNOSIS — R072 Precordial pain: Secondary | ICD-10-CM | POA: Diagnosis not present

## 2023-03-31 DIAGNOSIS — Z9049 Acquired absence of other specified parts of digestive tract: Secondary | ICD-10-CM | POA: Diagnosis not present

## 2023-03-31 DIAGNOSIS — R0789 Other chest pain: Secondary | ICD-10-CM | POA: Diagnosis not present

## 2023-03-31 DIAGNOSIS — R1013 Epigastric pain: Secondary | ICD-10-CM | POA: Diagnosis not present

## 2023-03-31 DIAGNOSIS — K2900 Acute gastritis without bleeding: Secondary | ICD-10-CM | POA: Diagnosis not present

## 2023-03-31 DIAGNOSIS — R079 Chest pain, unspecified: Secondary | ICD-10-CM | POA: Diagnosis not present

## 2023-03-31 DIAGNOSIS — R1011 Right upper quadrant pain: Secondary | ICD-10-CM | POA: Diagnosis not present

## 2023-04-01 DIAGNOSIS — R079 Chest pain, unspecified: Secondary | ICD-10-CM | POA: Diagnosis not present

## 2023-04-05 ENCOUNTER — Other Ambulatory Visit (HOSPITAL_BASED_OUTPATIENT_CLINIC_OR_DEPARTMENT_OTHER): Payer: Self-pay

## 2023-04-08 DIAGNOSIS — R030 Elevated blood-pressure reading, without diagnosis of hypertension: Secondary | ICD-10-CM | POA: Diagnosis not present

## 2023-04-08 DIAGNOSIS — Z30431 Encounter for routine checking of intrauterine contraceptive device: Secondary | ICD-10-CM | POA: Diagnosis not present

## 2023-04-24 ENCOUNTER — Other Ambulatory Visit (HOSPITAL_COMMUNITY): Payer: Self-pay

## 2023-04-24 ENCOUNTER — Other Ambulatory Visit (HOSPITAL_BASED_OUTPATIENT_CLINIC_OR_DEPARTMENT_OTHER): Payer: Self-pay

## 2023-04-24 DIAGNOSIS — G8929 Other chronic pain: Secondary | ICD-10-CM | POA: Diagnosis not present

## 2023-04-24 DIAGNOSIS — M5431 Sciatica, right side: Secondary | ICD-10-CM | POA: Diagnosis not present

## 2023-04-24 DIAGNOSIS — M545 Low back pain, unspecified: Secondary | ICD-10-CM | POA: Diagnosis not present

## 2023-04-24 DIAGNOSIS — Z79899 Other long term (current) drug therapy: Secondary | ICD-10-CM | POA: Diagnosis not present

## 2023-04-24 MED ORDER — GABAPENTIN 100 MG PO CAPS
100.0000 mg | ORAL_CAPSULE | Freq: Four times a day (QID) | ORAL | 1 refills | Status: AC
  Filled 2023-04-24: qty 120, 30d supply, fill #0

## 2023-04-24 MED ORDER — OXYCODONE HCL 15 MG PO TABS
7.5000 mg | ORAL_TABLET | Freq: Four times a day (QID) | ORAL | 0 refills | Status: DC | PRN
  Filled 2023-04-24 – 2023-04-25 (×4): qty 120, 30d supply, fill #0

## 2023-04-24 MED ORDER — METHOCARBAMOL 500 MG PO TABS
500.0000 mg | ORAL_TABLET | Freq: Three times a day (TID) | ORAL | 1 refills | Status: AC | PRN
  Filled 2023-04-24: qty 90, 30d supply, fill #0

## 2023-04-25 ENCOUNTER — Other Ambulatory Visit (HOSPITAL_COMMUNITY): Payer: Self-pay

## 2023-04-25 ENCOUNTER — Other Ambulatory Visit: Payer: Self-pay

## 2023-04-25 ENCOUNTER — Other Ambulatory Visit (HOSPITAL_BASED_OUTPATIENT_CLINIC_OR_DEPARTMENT_OTHER): Payer: Self-pay

## 2023-04-25 ENCOUNTER — Encounter: Payer: Self-pay | Admitting: Family

## 2023-05-08 DIAGNOSIS — G5622 Lesion of ulnar nerve, left upper limb: Secondary | ICD-10-CM | POA: Diagnosis not present

## 2023-05-08 DIAGNOSIS — G5621 Lesion of ulnar nerve, right upper limb: Secondary | ICD-10-CM | POA: Diagnosis not present

## 2023-05-08 DIAGNOSIS — M4326 Fusion of spine, lumbar region: Secondary | ICD-10-CM | POA: Diagnosis not present

## 2023-05-08 DIAGNOSIS — M5412 Radiculopathy, cervical region: Secondary | ICD-10-CM | POA: Diagnosis not present

## 2023-05-21 ENCOUNTER — Other Ambulatory Visit (HOSPITAL_COMMUNITY): Payer: Self-pay

## 2023-05-21 ENCOUNTER — Other Ambulatory Visit (HOSPITAL_BASED_OUTPATIENT_CLINIC_OR_DEPARTMENT_OTHER): Payer: Self-pay

## 2023-05-22 ENCOUNTER — Other Ambulatory Visit (HOSPITAL_COMMUNITY): Payer: Self-pay

## 2023-05-22 DIAGNOSIS — M545 Low back pain, unspecified: Secondary | ICD-10-CM | POA: Diagnosis not present

## 2023-05-22 DIAGNOSIS — G8929 Other chronic pain: Secondary | ICD-10-CM | POA: Diagnosis not present

## 2023-05-22 DIAGNOSIS — Z79899 Other long term (current) drug therapy: Secondary | ICD-10-CM | POA: Diagnosis not present

## 2023-05-22 DIAGNOSIS — M5431 Sciatica, right side: Secondary | ICD-10-CM | POA: Diagnosis not present

## 2023-05-22 MED ORDER — OXYCODONE HCL 15 MG PO TABS
7.5000 mg | ORAL_TABLET | Freq: Four times a day (QID) | ORAL | 0 refills | Status: DC | PRN
  Filled 2023-05-24: qty 120, 30d supply, fill #0

## 2023-05-23 ENCOUNTER — Other Ambulatory Visit (HOSPITAL_COMMUNITY): Payer: Self-pay

## 2023-05-24 ENCOUNTER — Other Ambulatory Visit (HOSPITAL_COMMUNITY): Payer: Self-pay

## 2023-06-20 ENCOUNTER — Other Ambulatory Visit (HOSPITAL_COMMUNITY): Payer: Self-pay

## 2023-06-20 DIAGNOSIS — R251 Tremor, unspecified: Secondary | ICD-10-CM | POA: Diagnosis not present

## 2023-06-20 DIAGNOSIS — G8929 Other chronic pain: Secondary | ICD-10-CM | POA: Diagnosis not present

## 2023-06-20 DIAGNOSIS — M79642 Pain in left hand: Secondary | ICD-10-CM | POA: Diagnosis not present

## 2023-06-20 DIAGNOSIS — G5623 Lesion of ulnar nerve, bilateral upper limbs: Secondary | ICD-10-CM | POA: Diagnosis not present

## 2023-06-20 DIAGNOSIS — M79641 Pain in right hand: Secondary | ICD-10-CM | POA: Diagnosis not present

## 2023-06-20 MED ORDER — OXYCODONE HCL 15 MG PO TABS
7.5000 mg | ORAL_TABLET | Freq: Four times a day (QID) | ORAL | 0 refills | Status: DC | PRN
Start: 2023-06-20 — End: 2023-10-08
  Filled 2023-06-21: qty 120, 30d supply, fill #0

## 2023-06-21 ENCOUNTER — Other Ambulatory Visit (HOSPITAL_COMMUNITY): Payer: Self-pay

## 2023-06-21 DIAGNOSIS — F331 Major depressive disorder, recurrent, moderate: Secondary | ICD-10-CM | POA: Diagnosis not present

## 2023-06-30 HISTORY — PX: NEUROPLASTY / TRANSPOSITION ULNAR NERVE AT ELBOW: SUR895

## 2023-07-08 DIAGNOSIS — M4326 Fusion of spine, lumbar region: Secondary | ICD-10-CM | POA: Diagnosis not present

## 2023-07-08 DIAGNOSIS — M5416 Radiculopathy, lumbar region: Secondary | ICD-10-CM | POA: Diagnosis not present

## 2023-07-16 ENCOUNTER — Other Ambulatory Visit (HOSPITAL_BASED_OUTPATIENT_CLINIC_OR_DEPARTMENT_OTHER): Payer: Self-pay | Admitting: Emergency Medicine

## 2023-07-16 ENCOUNTER — Emergency Department (HOSPITAL_BASED_OUTPATIENT_CLINIC_OR_DEPARTMENT_OTHER)
Admission: EM | Admit: 2023-07-16 | Discharge: 2023-07-16 | Disposition: A | Discharge status: Home / Self Care | Attending: Emergency Medicine | Admitting: Emergency Medicine

## 2023-07-16 ENCOUNTER — Other Ambulatory Visit: Payer: Self-pay

## 2023-07-16 ENCOUNTER — Encounter (HOSPITAL_BASED_OUTPATIENT_CLINIC_OR_DEPARTMENT_OTHER): Payer: Self-pay

## 2023-07-16 ENCOUNTER — Ambulatory Visit (HOSPITAL_BASED_OUTPATIENT_CLINIC_OR_DEPARTMENT_OTHER)
Admission: RE | Admit: 2023-07-16 | Discharge: 2023-07-16 | Disposition: A | Discharge status: Home / Self Care | Source: Ambulatory Visit | Attending: Emergency Medicine | Admitting: Emergency Medicine

## 2023-07-16 DIAGNOSIS — M79601 Pain in right arm: Secondary | ICD-10-CM | POA: Diagnosis not present

## 2023-07-16 DIAGNOSIS — M7989 Other specified soft tissue disorders: Secondary | ICD-10-CM | POA: Insufficient documentation

## 2023-07-16 DIAGNOSIS — R200 Anesthesia of skin: Secondary | ICD-10-CM | POA: Diagnosis not present

## 2023-07-16 DIAGNOSIS — M4326 Fusion of spine, lumbar region: Secondary | ICD-10-CM | POA: Diagnosis not present

## 2023-07-16 DIAGNOSIS — M6281 Muscle weakness (generalized): Secondary | ICD-10-CM | POA: Diagnosis not present

## 2023-07-16 DIAGNOSIS — M79602 Pain in left arm: Secondary | ICD-10-CM

## 2023-07-16 DIAGNOSIS — I82409 Acute embolism and thrombosis of unspecified deep veins of unspecified lower extremity: Secondary | ICD-10-CM | POA: Diagnosis not present

## 2023-07-16 DIAGNOSIS — M545 Low back pain, unspecified: Secondary | ICD-10-CM | POA: Diagnosis not present

## 2023-07-16 MED ORDER — DOXYCYCLINE HYCLATE 100 MG PO TABS
100.0000 mg | ORAL_TABLET | Freq: Once | ORAL | Status: AC
  Administered 2023-07-16: 100 mg via ORAL
  Filled 2023-07-16: qty 1

## 2023-07-16 MED ORDER — DOXYCYCLINE HYCLATE 100 MG PO CAPS: 100.0000 mg | ORAL_CAPSULE | Freq: Two times a day (BID) | ORAL | 0 refills | Status: DC

## 2023-07-16 MED ORDER — HYDROMORPHONE HCL 1 MG/ML IJ SOLN
2.0000 mg | Freq: Once | INTRAMUSCULAR | Status: AC
  Administered 2023-07-16: 2 mg via INTRAMUSCULAR
  Filled 2023-07-16: qty 2

## 2023-07-16 NOTE — ED Triage Notes (Signed)
 Pt was dx with cubital tunnel syndrome and has sx planned at the end the month.   Has taken Robaxin , gabapentin  (2100) and 1/2 of a percocet (1900) without relief

## 2023-07-16 NOTE — ED Provider Notes (Signed)
 [place] [facility] AT [facility] Provider Note   CSN: [id number] Arrival date & time: 07/16/23  0114     Patient presents with: rt. arm pain   Laura Shah is a 43 y.o. female.   Pain is a 43 year old female with past medical history of pancreatitis, irritable bowel, iron deficiency anemia.  Patient diagnosed with cubital tunnel syndrome recently and is due to have surgery on her right arm the end of the month.  Patient presenting today with worsening pain in the arm over the past 2 days.  She feels as though her arm is swollen.  She has taken Robaxin , gabapentin , and one half of a 15 mg oxycodone  without relief.       Prior to Admission medications   Medication Sig Start Date End Date Taking? Authorizing Provider  amitriptyline (ELAVIL) 10 MG tablet Take 10 mg by mouth at bedtime.    [provider]  dicyclomine  (BENTYL ) 20 MG tablet Take 1 tablet (20 mg total) by mouth 2 (two) times daily. 05/29/22   Scarlette Currier, MD  folic acid  (FOLVITE ) 1 MG tablet Take 1 tablet (1 mg total) by mouth daily. 04/14/20   Tish Forge, NP  gabapentin  (NEURONTIN ) 100 MG capsule Take 100 mg by mouth at bedtime. 1-2 tablets at HS    [provider]  gabapentin  (NEURONTIN ) 100 MG capsule Take 1 capsule (100 mg total) by mouth3 (three) to 4 (four) times daily. 04/24/23     methocarbamol  (ROBAXIN ) 500 MG tablet Take 1 tablet (500 mg total) by mouth 3 (three) times daily as needed for spasms. Max daily dose of 3 tabs daily. 04/24/23     ondansetron  (ZOFRAN -ODT) 4 MG disintegrating tablet Take 1 tablet (4 mg total) by mouth every 8 (eight) hours as needed for nausea or vomiting. 05/29/22   Scarlette Currier, MD  oxyCODONE  (ROXICODONE ) 15 MG immediate release tablet Take 1 tablet (15 mg total) by mouth every 4 (four) to 6 (six) hours as needed   No more than 5 tabs per day 03/25/23     oxyCODONE  (ROXICODONE ) 15 MG immediate release tablet Take 1/2-1 tablet (7.5-15  mg total) by mouth 4 (four) times daily as needed for pain. 03/29/23     oxyCODONE  (ROXICODONE ) 15 MG immediate release tablet Take 0.5-1 tablets (7.5-15 mg total) by mouth 4 (four) times daily as needed for pain. 04/24/23     oxyCODONE  (ROXICODONE ) 15 MG immediate release tablet Take 1/2-1 tablets (7.5-15 mg total) by mouth 4 (four) times daily as needed for pain 05/24/23     oxyCODONE  (ROXICODONE ) 15 MG immediate release tablet Take 1/2-1 tablet (7.5-15 mg total) by mouth 4 (four) times daily as needed for pain. 06/20/23     pantoprazole  (PROTONIX ) 40 MG tablet Take 1 tablet (40 mg total) by mouth daily. 08/28/14   Alissa April, MD  senna-docusate (SENOKOT-S) 8.6-50 MG tablet Take 1 tablet by mouth at bedtime as needed for mild constipation. 11/28/21   Long, Joshua G, MD  vitamin B-12 (CYANOCOBALAMIN ) 100 MCG tablet every 30 (thirty) days. Injection she takes monthly last dose was 11/10/2021. 11/17/2021.    [provider]  Vitamin D, Ergocalciferol, (DRISDOL) 1.25 MG (50000 UNIT) CAPS capsule Take 50,000 Units by mouth every 7 (seven) days.    [provider]    Allergies: Metoclopramide , Morphine  and codeine, Morphine  sulfate, Other, and Tape    Review of Systems  All other systems reviewed and are negative.   Updated Vital  Signs BP (!) 158/93   Pulse 88   Temp (!) 97 F (36.1 C) (Temporal)   Resp 18   Ht 5' 2 (1.575 m)   Wt 68.5 kg   SpO2 94%   BMI 27.62 kg/m   Physical Exam Vitals and nursing note reviewed.  Constitutional:      Appearance: Normal appearance.  HENT:     Head: Normocephalic.  Pulmonary:     Effort: Pulmonary effort is normal.   Musculoskeletal:     Comments: The right arm is grossly normal in appearance, but does have perhaps mild swelling just proximal to the elbow.  She is able to flex, extend, and oppose all fingers, but does seem to have some weakness to the fingers.  Ulnar and radial pulses are palpable.  There is also a small area of  erythema with a central puncture noted as well.   Skin:    General: Skin is warm and dry.   Neurological:     Mental Status: She is alert and oriented to person, place, and time.     (all labs ordered are listed, but only abnormal results are displayed) Labs Reviewed - No data to display  EKG: None  Radiology: No results found.   Procedures   Medications Ordered in the ED  HYDROmorphone  (DILAUDID ) injection 2 mg (has no administration in time range)                                    Medical Decision Making Risk Prescription drug management.   Patient is a 43 year old female presenting with right arm pain.  She was diagnosed with cubital tunnel syndrome and is awaiting surgery the end of the month.  Patient presents here stating that her arm pain is worsening and that it now feels swollen.  Vital signs are stable and patient is afebrile.  Physical examination reveals perhaps mild swelling of the elbow and bicep and a possible insect bite to the back of the elbow.  Because of patient's pain and swelling unclear, but appears neurovascularly intact with soft compartments.  Will treat with doxycycline  in case there was a tick or insect bite.  I will also arrange an ultrasound for the morning to rule out a blood clot.  Patient will be placed in a sling and advised to follow-up with orthopedic doctor if not improving.     Final diagnoses:  None    ED Discharge Orders     None          Orvilla Blander, MD 07/16/23 [phone]

## 2023-07-16 NOTE — Discharge Instructions (Signed)
 Begin taking doxycycline  as prescribed.  Wear arm sling for comfort and support.  Return in the morning at the given time for an ultrasound to rule out a blood clot.  Follow-up with your orthopedist if symptoms or not improving in the next few days, and return to the ER if symptoms worsen or change.

## 2023-07-18 ENCOUNTER — Other Ambulatory Visit (HOSPITAL_BASED_OUTPATIENT_CLINIC_OR_DEPARTMENT_OTHER): Payer: Self-pay

## 2023-07-18 ENCOUNTER — Other Ambulatory Visit (HOSPITAL_COMMUNITY): Payer: Self-pay

## 2023-07-18 DIAGNOSIS — M4326 Fusion of spine, lumbar region: Secondary | ICD-10-CM | POA: Diagnosis not present

## 2023-07-18 DIAGNOSIS — M545 Low back pain, unspecified: Secondary | ICD-10-CM | POA: Diagnosis not present

## 2023-07-18 DIAGNOSIS — M6281 Muscle weakness (generalized): Secondary | ICD-10-CM | POA: Diagnosis not present

## 2023-07-19 ENCOUNTER — Other Ambulatory Visit (HOSPITAL_BASED_OUTPATIENT_CLINIC_OR_DEPARTMENT_OTHER): Payer: Self-pay

## 2023-07-19 DIAGNOSIS — M5431 Sciatica, right side: Secondary | ICD-10-CM | POA: Diagnosis not present

## 2023-07-19 DIAGNOSIS — F1120 Opioid dependence, uncomplicated: Secondary | ICD-10-CM | POA: Diagnosis not present

## 2023-07-19 DIAGNOSIS — Z79899 Other long term (current) drug therapy: Secondary | ICD-10-CM | POA: Diagnosis not present

## 2023-07-19 DIAGNOSIS — M545 Low back pain, unspecified: Secondary | ICD-10-CM | POA: Diagnosis not present

## 2023-07-19 DIAGNOSIS — G8929 Other chronic pain: Secondary | ICD-10-CM | POA: Diagnosis not present

## 2023-07-19 MED ORDER — NALOXONE HCL 4 MG/0.1ML NA LIQD
1.0000 | NASAL | 6 refills | Status: AC | PRN
  Filled 2023-07-19: qty 2, 30d supply, fill #0

## 2023-07-19 MED ORDER — OXYCODONE HCL 15 MG PO TABS
7.5000 mg | ORAL_TABLET | Freq: Four times a day (QID) | ORAL | 0 refills | Status: DC | PRN
  Filled 2023-08-01: qty 120, 30d supply, fill #0

## 2023-07-22 ENCOUNTER — Other Ambulatory Visit (HOSPITAL_BASED_OUTPATIENT_CLINIC_OR_DEPARTMENT_OTHER): Payer: Self-pay

## 2023-07-23 DIAGNOSIS — M6281 Muscle weakness (generalized): Secondary | ICD-10-CM | POA: Diagnosis not present

## 2023-07-23 DIAGNOSIS — M545 Low back pain, unspecified: Secondary | ICD-10-CM | POA: Diagnosis not present

## 2023-07-23 DIAGNOSIS — M4326 Fusion of spine, lumbar region: Secondary | ICD-10-CM | POA: Diagnosis not present

## 2023-07-26 DIAGNOSIS — G8918 Other acute postprocedural pain: Secondary | ICD-10-CM | POA: Diagnosis not present

## 2023-07-26 DIAGNOSIS — G5621 Lesion of ulnar nerve, right upper limb: Secondary | ICD-10-CM | POA: Diagnosis not present

## 2023-07-30 DIAGNOSIS — M4326 Fusion of spine, lumbar region: Secondary | ICD-10-CM | POA: Diagnosis not present

## 2023-07-30 DIAGNOSIS — M6281 Muscle weakness (generalized): Secondary | ICD-10-CM | POA: Diagnosis not present

## 2023-07-30 DIAGNOSIS — M545 Low back pain, unspecified: Secondary | ICD-10-CM | POA: Diagnosis not present

## 2023-08-01 ENCOUNTER — Other Ambulatory Visit (HOSPITAL_BASED_OUTPATIENT_CLINIC_OR_DEPARTMENT_OTHER): Payer: Self-pay

## 2023-08-01 DIAGNOSIS — M6281 Muscle weakness (generalized): Secondary | ICD-10-CM | POA: Diagnosis not present

## 2023-08-01 DIAGNOSIS — M545 Low back pain, unspecified: Secondary | ICD-10-CM | POA: Diagnosis not present

## 2023-08-01 DIAGNOSIS — M4326 Fusion of spine, lumbar region: Secondary | ICD-10-CM | POA: Diagnosis not present

## 2023-08-05 DIAGNOSIS — G5621 Lesion of ulnar nerve, right upper limb: Secondary | ICD-10-CM | POA: Diagnosis not present

## 2023-08-06 DIAGNOSIS — M6281 Muscle weakness (generalized): Secondary | ICD-10-CM | POA: Diagnosis not present

## 2023-08-06 DIAGNOSIS — M4326 Fusion of spine, lumbar region: Secondary | ICD-10-CM | POA: Diagnosis not present

## 2023-08-06 DIAGNOSIS — M545 Low back pain, unspecified: Secondary | ICD-10-CM | POA: Diagnosis not present

## 2023-08-09 DIAGNOSIS — F331 Major depressive disorder, recurrent, moderate: Secondary | ICD-10-CM | POA: Diagnosis not present

## 2023-08-22 DIAGNOSIS — M545 Low back pain, unspecified: Secondary | ICD-10-CM | POA: Diagnosis not present

## 2023-08-22 DIAGNOSIS — M6281 Muscle weakness (generalized): Secondary | ICD-10-CM | POA: Diagnosis not present

## 2023-08-22 DIAGNOSIS — M4326 Fusion of spine, lumbar region: Secondary | ICD-10-CM | POA: Diagnosis not present

## 2023-08-23 DIAGNOSIS — F331 Major depressive disorder, recurrent, moderate: Secondary | ICD-10-CM | POA: Diagnosis not present

## 2023-08-26 DIAGNOSIS — M4326 Fusion of spine, lumbar region: Secondary | ICD-10-CM | POA: Diagnosis not present

## 2023-08-26 DIAGNOSIS — M6281 Muscle weakness (generalized): Secondary | ICD-10-CM | POA: Diagnosis not present

## 2023-08-26 DIAGNOSIS — M545 Low back pain, unspecified: Secondary | ICD-10-CM | POA: Diagnosis not present

## 2023-08-27 DIAGNOSIS — T7840XD Allergy, unspecified, subsequent encounter: Secondary | ICD-10-CM | POA: Diagnosis not present

## 2023-08-27 DIAGNOSIS — Z6826 Body mass index (BMI) 26.0-26.9, adult: Secondary | ICD-10-CM | POA: Diagnosis not present

## 2023-08-27 DIAGNOSIS — D509 Iron deficiency anemia, unspecified: Secondary | ICD-10-CM | POA: Diagnosis not present

## 2023-08-27 DIAGNOSIS — A09 Infectious gastroenteritis and colitis, unspecified: Secondary | ICD-10-CM | POA: Diagnosis not present

## 2023-08-28 DIAGNOSIS — D509 Iron deficiency anemia, unspecified: Secondary | ICD-10-CM | POA: Diagnosis not present

## 2023-08-28 DIAGNOSIS — Q678 Other congenital deformities of chest: Secondary | ICD-10-CM | POA: Diagnosis not present

## 2023-08-28 DIAGNOSIS — E559 Vitamin D deficiency, unspecified: Secondary | ICD-10-CM | POA: Diagnosis not present

## 2023-08-28 DIAGNOSIS — E049 Nontoxic goiter, unspecified: Secondary | ICD-10-CM | POA: Diagnosis not present

## 2023-08-28 DIAGNOSIS — E538 Deficiency of other specified B group vitamins: Secondary | ICD-10-CM | POA: Diagnosis not present

## 2023-08-29 ENCOUNTER — Other Ambulatory Visit: Payer: Self-pay | Admitting: Family Medicine

## 2023-08-29 DIAGNOSIS — E010 Iodine-deficiency related diffuse (endemic) goiter: Secondary | ICD-10-CM

## 2023-08-29 DIAGNOSIS — Q678 Other congenital deformities of chest: Secondary | ICD-10-CM

## 2023-08-30 ENCOUNTER — Ambulatory Visit
Admission: RE | Admit: 2023-08-30 | Discharge: 2023-08-30 | Disposition: A | Discharge status: Home / Self Care | Source: Ambulatory Visit | Attending: Family Medicine | Admitting: Family Medicine

## 2023-08-30 DIAGNOSIS — R222 Localized swelling, mass and lump, trunk: Secondary | ICD-10-CM | POA: Diagnosis not present

## 2023-08-30 DIAGNOSIS — M6281 Muscle weakness (generalized): Secondary | ICD-10-CM | POA: Diagnosis not present

## 2023-08-30 DIAGNOSIS — Q678 Other congenital deformities of chest: Secondary | ICD-10-CM

## 2023-08-30 DIAGNOSIS — E010 Iodine-deficiency related diffuse (endemic) goiter: Secondary | ICD-10-CM

## 2023-08-30 DIAGNOSIS — M4326 Fusion of spine, lumbar region: Secondary | ICD-10-CM | POA: Diagnosis not present

## 2023-08-30 DIAGNOSIS — M545 Low back pain, unspecified: Secondary | ICD-10-CM | POA: Diagnosis not present

## 2023-09-02 DIAGNOSIS — M4326 Fusion of spine, lumbar region: Secondary | ICD-10-CM | POA: Diagnosis not present

## 2023-09-02 DIAGNOSIS — G5621 Lesion of ulnar nerve, right upper limb: Secondary | ICD-10-CM | POA: Diagnosis not present

## 2023-09-02 DIAGNOSIS — M6281 Muscle weakness (generalized): Secondary | ICD-10-CM | POA: Diagnosis not present

## 2023-09-02 DIAGNOSIS — M545 Low back pain, unspecified: Secondary | ICD-10-CM | POA: Diagnosis not present

## 2023-09-03 ENCOUNTER — Other Ambulatory Visit (HOSPITAL_BASED_OUTPATIENT_CLINIC_OR_DEPARTMENT_OTHER): Payer: Self-pay

## 2023-09-03 DIAGNOSIS — D219 Benign neoplasm of connective and other soft tissue, unspecified: Secondary | ICD-10-CM | POA: Diagnosis not present

## 2023-09-03 DIAGNOSIS — N949 Unspecified condition associated with female genital organs and menstrual cycle: Secondary | ICD-10-CM | POA: Diagnosis not present

## 2023-09-03 MED ORDER — OXYCODONE HCL 15 MG PO TABS
7.5000 mg | ORAL_TABLET | Freq: Four times a day (QID) | ORAL | 0 refills | Status: DC | PRN
  Filled 2023-09-03: qty 120, 30d supply, fill #0

## 2023-09-06 ENCOUNTER — Telehealth: Payer: Self-pay

## 2023-09-06 ENCOUNTER — Other Ambulatory Visit: Payer: Self-pay | Admitting: Family Medicine

## 2023-09-06 NOTE — Telephone Encounter (Signed)
 Auth Submission: NO AUTH NEEDED Site of care: Site of care: [facility] Payer: [employer] [place]  commercial Medication & CPT/J Code(s) submitted: Feraheme (ferumoxytol) [id number] Diagnosis Code:  Route of submission (phone, fax, portal): phone Phone # [phone] Fax # Auth type: Buy/Bill PB Units/visits requested: 510mg  x 2 doses Reference number: [id number] Approval from: 09/06/23 to 01/06/24

## 2023-09-09 DIAGNOSIS — M6281 Muscle weakness (generalized): Secondary | ICD-10-CM | POA: Diagnosis not present

## 2023-09-09 DIAGNOSIS — M545 Low back pain, unspecified: Secondary | ICD-10-CM | POA: Diagnosis not present

## 2023-09-09 DIAGNOSIS — M4326 Fusion of spine, lumbar region: Secondary | ICD-10-CM | POA: Diagnosis not present

## 2023-09-11 DIAGNOSIS — M4326 Fusion of spine, lumbar region: Secondary | ICD-10-CM | POA: Diagnosis not present

## 2023-09-11 DIAGNOSIS — M545 Low back pain, unspecified: Secondary | ICD-10-CM | POA: Diagnosis not present

## 2023-09-11 DIAGNOSIS — M6281 Muscle weakness (generalized): Secondary | ICD-10-CM | POA: Diagnosis not present

## 2023-09-16 DIAGNOSIS — M4326 Fusion of spine, lumbar region: Secondary | ICD-10-CM | POA: Diagnosis not present

## 2023-09-16 DIAGNOSIS — M545 Low back pain, unspecified: Secondary | ICD-10-CM | POA: Diagnosis not present

## 2023-09-16 DIAGNOSIS — M6281 Muscle weakness (generalized): Secondary | ICD-10-CM | POA: Diagnosis not present

## 2023-09-19 DIAGNOSIS — D219 Benign neoplasm of connective and other soft tissue, unspecified: Secondary | ICD-10-CM | POA: Diagnosis not present

## 2023-09-19 DIAGNOSIS — N949 Unspecified condition associated with female genital organs and menstrual cycle: Secondary | ICD-10-CM | POA: Diagnosis not present

## 2023-09-19 DIAGNOSIS — Z0000 Encounter for general adult medical examination without abnormal findings: Secondary | ICD-10-CM | POA: Diagnosis not present

## 2023-09-19 DIAGNOSIS — E611 Iron deficiency: Secondary | ICD-10-CM | POA: Diagnosis not present

## 2023-09-19 DIAGNOSIS — Z6828 Body mass index (BMI) 28.0-28.9, adult: Secondary | ICD-10-CM | POA: Diagnosis not present

## 2023-09-19 DIAGNOSIS — Q678 Other congenital deformities of chest: Secondary | ICD-10-CM | POA: Diagnosis not present

## 2023-09-19 DIAGNOSIS — E538 Deficiency of other specified B group vitamins: Secondary | ICD-10-CM | POA: Diagnosis not present

## 2023-09-19 DIAGNOSIS — F411 Generalized anxiety disorder: Secondary | ICD-10-CM | POA: Diagnosis not present

## 2023-09-23 DIAGNOSIS — M4326 Fusion of spine, lumbar region: Secondary | ICD-10-CM | POA: Diagnosis not present

## 2023-09-23 DIAGNOSIS — M545 Low back pain, unspecified: Secondary | ICD-10-CM | POA: Diagnosis not present

## 2023-09-23 DIAGNOSIS — M6281 Muscle weakness (generalized): Secondary | ICD-10-CM | POA: Diagnosis not present

## 2023-09-25 DIAGNOSIS — M6281 Muscle weakness (generalized): Secondary | ICD-10-CM | POA: Diagnosis not present

## 2023-09-25 DIAGNOSIS — M545 Low back pain, unspecified: Secondary | ICD-10-CM | POA: Diagnosis not present

## 2023-09-25 DIAGNOSIS — M4326 Fusion of spine, lumbar region: Secondary | ICD-10-CM | POA: Diagnosis not present

## 2023-09-26 ENCOUNTER — Other Ambulatory Visit: Payer: Self-pay | Admitting: Family Medicine

## 2023-09-26 DIAGNOSIS — Q678 Other congenital deformities of chest: Secondary | ICD-10-CM

## 2023-09-27 DIAGNOSIS — F331 Major depressive disorder, recurrent, moderate: Secondary | ICD-10-CM | POA: Diagnosis not present

## 2023-09-28 DIAGNOSIS — G8929 Other chronic pain: Secondary | ICD-10-CM | POA: Diagnosis not present

## 2023-09-28 DIAGNOSIS — F1120 Opioid dependence, uncomplicated: Secondary | ICD-10-CM | POA: Diagnosis not present

## 2023-09-28 DIAGNOSIS — M5431 Sciatica, right side: Secondary | ICD-10-CM | POA: Diagnosis not present

## 2023-09-28 DIAGNOSIS — M545 Low back pain, unspecified: Secondary | ICD-10-CM | POA: Diagnosis not present

## 2023-09-28 DIAGNOSIS — Z79899 Other long term (current) drug therapy: Secondary | ICD-10-CM | POA: Diagnosis not present

## 2023-09-29 ENCOUNTER — Other Ambulatory Visit (HOSPITAL_BASED_OUTPATIENT_CLINIC_OR_DEPARTMENT_OTHER): Payer: Self-pay

## 2023-09-30 ENCOUNTER — Other Ambulatory Visit (HOSPITAL_BASED_OUTPATIENT_CLINIC_OR_DEPARTMENT_OTHER): Payer: Self-pay

## 2023-09-30 MED ORDER — OXYCODONE HCL 15 MG PO TABS
7.5000 mg | ORAL_TABLET | Freq: Four times a day (QID) | ORAL | 0 refills | Status: AC | PRN
  Filled 2023-10-01 – 2023-10-02 (×2): qty 120, 30d supply, fill #0

## 2023-10-01 ENCOUNTER — Other Ambulatory Visit (HOSPITAL_BASED_OUTPATIENT_CLINIC_OR_DEPARTMENT_OTHER): Payer: Self-pay

## 2023-10-02 ENCOUNTER — Other Ambulatory Visit (HOSPITAL_BASED_OUTPATIENT_CLINIC_OR_DEPARTMENT_OTHER): Payer: Self-pay

## 2023-10-02 ENCOUNTER — Ambulatory Visit

## 2023-10-02 VITALS — BP 136/84 | HR 83 | Temp 97.9°F | Resp 12 | Ht 62.0 in | Wt 150.2 lb

## 2023-10-02 DIAGNOSIS — D509 Iron deficiency anemia, unspecified: Secondary | ICD-10-CM

## 2023-10-02 DIAGNOSIS — M4326 Fusion of spine, lumbar region: Secondary | ICD-10-CM | POA: Diagnosis not present

## 2023-10-02 DIAGNOSIS — M545 Low back pain, unspecified: Secondary | ICD-10-CM | POA: Diagnosis not present

## 2023-10-02 DIAGNOSIS — D500 Iron deficiency anemia secondary to blood loss (chronic): Secondary | ICD-10-CM

## 2023-10-02 DIAGNOSIS — M6281 Muscle weakness (generalized): Secondary | ICD-10-CM | POA: Diagnosis not present

## 2023-10-02 MED ORDER — SODIUM CHLORIDE 0.9 % IV SOLN
510.0000 mg | Freq: Once | INTRAVENOUS | Status: AC
  Administered 2023-10-02: 510 mg via INTRAVENOUS
  Filled 2023-10-02: qty 17

## 2023-10-02 NOTE — Progress Notes (Signed)
 Diagnosis: Iron Deficiency Anemia  Provider:  Praveen Mannam MD  Procedure: IV Infusion  IV Type: Peripheral, IV Location: L Antecubital  Feraheme (Ferumoxytol ), Dose: 510 mg  Infusion Start Time: 1548  Infusion Stop Time: 83919  Post Infusion IV Care: Observation period completed and Peripheral IV Discontinued  Discharge: Condition: Good, Destination: Home . AVS Provided  Performed by:  Mitchell Epling G Pilkington-Burchett, RN

## 2023-10-03 DIAGNOSIS — Z79899 Other long term (current) drug therapy: Secondary | ICD-10-CM | POA: Diagnosis not present

## 2023-10-09 ENCOUNTER — Encounter (HOSPITAL_COMMUNITY): Payer: Self-pay | Admitting: Obstetrics and Gynecology

## 2023-10-09 ENCOUNTER — Ambulatory Visit (INDEPENDENT_AMBULATORY_CARE_PROVIDER_SITE_OTHER)

## 2023-10-09 ENCOUNTER — Encounter (HOSPITAL_COMMUNITY): Payer: Self-pay

## 2023-10-09 VITALS — BP 149/90 | HR 84 | Temp 98.5°F | Resp 16 | Ht 62.0 in | Wt 149.6 lb

## 2023-10-09 DIAGNOSIS — M4326 Fusion of spine, lumbar region: Secondary | ICD-10-CM | POA: Diagnosis not present

## 2023-10-09 DIAGNOSIS — D509 Iron deficiency anemia, unspecified: Secondary | ICD-10-CM

## 2023-10-09 DIAGNOSIS — M6281 Muscle weakness (generalized): Secondary | ICD-10-CM | POA: Diagnosis not present

## 2023-10-09 DIAGNOSIS — M545 Low back pain, unspecified: Secondary | ICD-10-CM | POA: Diagnosis not present

## 2023-10-09 DIAGNOSIS — D500 Iron deficiency anemia secondary to blood loss (chronic): Secondary | ICD-10-CM

## 2023-10-09 MED ORDER — SODIUM CHLORIDE 0.9 % IV SOLN
510.0000 mg | Freq: Once | INTRAVENOUS | Status: AC
  Administered 2023-10-09: 510 mg via INTRAVENOUS
  Filled 2023-10-09: qty 17

## 2023-10-09 NOTE — Progress Notes (Signed)
 Diagnosis: Iron Deficiency Anemia  Provider:  Praveen Mannam MD  Procedure: IV Infusion  IV Type: Peripheral, IV Location: L Antecubital  Feraheme (Ferumoxytol ), Dose: 510 mg  Infusion Start Time: 1607  Infusion Stop Time: 1625  Post Infusion IV Care: Observation period completed and Peripheral IV Discontinued  Discharge: Condition: Good, Destination: Home . AVS Declined  Performed by:  Maximiano JONELLE Pouch, LPN

## 2023-10-09 NOTE — Progress Notes (Signed)
 Spoke w/ via phone for pre-op interview--- pt Lab needs dos----   cbc/ t&s/ upt (per anes)      Lab results------ no COVID test -----patient states asymptomatic no test needed Arrive at ------- 1045 pn 10-15-2023 NPO after MN NO Solid Food.  Clear liquids from MN until---  0945 Pre-Surgery Ensure or G2:  n/a  Med rec completed Medications to take morning of surgery -----  gabapentin / protonix / wellbutrin Diabetic medication -----  n/a  GLP1 agonist last dose:  n/a GLP1 instructions:  Patient instructed no nail polish to be worn day of surgery Patient instructed to bring photo id and insurance card day of surgery Patient aware to have Driver (ride ) / caregiver    for 24 hours after surgery - fiance, jeffrey mcclain Patient Special Instructions -----  sent surgical instructions to pt in her my chart account. Pt verbalized understanding of instructions without questions but given number to call prior to surgery if any questions.  Pt will get hiblcens soap at any pharmancy Pre-Op special Instructions -----  sent inbox message to Dr Rosalva in epic on 10-08-2023,  requested pre- orders   Patient verbalized understanding of instructions that were given at this phone interview. Patient denies chest pain, sob, fever, cough at the interview.

## 2023-10-14 ENCOUNTER — Other Ambulatory Visit: Payer: Self-pay | Admitting: Obstetrics and Gynecology

## 2023-10-14 DIAGNOSIS — G5621 Lesion of ulnar nerve, right upper limb: Secondary | ICD-10-CM | POA: Diagnosis not present

## 2023-10-14 DIAGNOSIS — D219 Benign neoplasm of connective and other soft tissue, unspecified: Secondary | ICD-10-CM

## 2023-10-14 NOTE — H&P (Signed)
   The note originally documented on this encounter has been moved the the encounter in which it belongs.

## 2023-10-14 NOTE — H&P (Signed)
 Subjective:    Chief Complaint(s): *Preop vt    HPI:      General 43 y/o presents for pre-op visit. Pt is schedule for a robotic assisted laparoscopic hysterectomy with bilateral salpingo-oophorectomy on 10/15/2023 for the management of adnexal mass and fibroids.   IN REVIEW:  An ultrasound performed on 09/03/2023, showed a uterus measuring 7.96 cm x 3.98 cm x 5.85 cm. Five fibroids were measured, with the largest being 2.7 cm. All fibroids are either stable or decreased in size from her previous ultrasound on 02/28/2023. She has a Mirena  IUD, which is in the proper place on ultrasound. The right ovary has some follicles but appears normal. The left ovary has a large cyst measuring 8.7 cm. --- She reported experiencing pain during intercourse for the past month, primarily on the left side. Initially, she experienced spotting, but currently, she does not have any menstrual periods.  She has a history of recurrent cysts since the age of 49, with the first one rupturing at that time. She also mentions a past liver cyst,  however believes liver enzymes were normal.    CONTRACEPTION: - Type: Mirena  IUD.      Current Medication:     Taking buPROPion  HCl ER (XL) 150 MG Tablet Extended Release 24 Hour 1 tablet in the morning Orally Once a day. EpiPen  2-Pak(EPINEPHrine ) 0.3 MG/0.3ML Solution Auto-injector as directed Injection up to twice a day. Ondansetron  HCl 4 MG Tablet 1 tablet Orally Once a day. Pantoprazole  Sodium 40 MG Tablet Delayed Release 1 tablet Orally Once a day. Zofran  ODT 8 MG Tablet Disintegrating 1 tablet on the tongue and allow to dissolve as needed Orally every 8 hours as needed for nausea. Gabapentin  100 MG Capsule 1 capsule Oral 4 times a day , Notes to Pharmacist: RODGERS MALLORY. Methocarbamol  500 MG Tablet 1 tablets Oral 3 times daily. Trulance(Plecanatide) 3 MG Tablet 1 tablet Orally Once a day As needed. oxyCODONE  HCl 15 MG Tablet 1 tablet Orally every 6 hrs As  needed. Cyanocobalamin  1000 MCG/ML Solution 1 mL Injection once a month , Notes to Pharmacist: WHARTON. Mirena  (52 MG)(Levonorgestrel ) 20 MCG/DAY Intrauterine Device as directed Intrauterine , Notes to Pharmacist: GYN. Medication List reviewed and reconciled with the patient.    Medical History: Interstitial Cystitis Dr.Nesi atypical chest pain, following with Dr. Shlomo papular eczema mild anemia acute pancreatitis, referred to GI in 03/2014 obesity, bariatric surgical center [facility] [place] - Dr Cara portal vein thrombosis; s/p bariatric surgery 01/2014 - follow up evaluation with Dr Madison, hematology **Due to history, pt may require extended anticoagulation prophylaxis up to 30 days for surgery if needed in the future (per vascular)** LBP - s/p anterior lumbar decompression - Dr Colon 09/2018 Unilateral DVT (after pregnancy) Vit D deficiency Iron deficiency    Allergies/Intolerance: Morphine  Sulfate: Side Effects - itching Reglan : Allergy - Hallucinations Surgical Glue: Allergy - rash/itching    Gyn History: Sexual activity currently sexually active.  Periods : irregular.  LMP 02/20/2023.  Birth control Mirena , 02/28/2023.  Last pap smear date 09/10/2019 NILM, HRHPV neg.  Last mammogram date 03/2023.  Denies Abnormal pap smear.  Denies STD.     OB History: Number of pregnancies  3, 1 c/s, 1 vaginal delivery, 1 EAB, no SAB.  Pregnancy # 1  C-section delivery.  Pregnancy # 2  vaginal delivery.  Pregnancy # 3  abortion.     Surgical History: removal of ruptured cyst 1997 appendectomy 2009 cystoscopy laporoscopy to r/u endometreosis by Dr. Olam Leonce Ada 2009  C-section bariatric surgery - gastric sleeve 12/2014 cholecystectomy 03/31/2015 normal colonoscopy - Ladora First 2017 Diskectomy L4 L5 lumbar 05/2017 Anterior lumbar decompression L4-L5 with interbody arthrodesis using titanium spacer and screw fixation- Dr. Colon 09/2018 Colonoscopy 11/21 laparoscopic ovarian cyst removal  11/23/2021 L4-L5 and L5-S fusion 12/31/2022 Cubital Tunnel Surgery 07/26/23    Hospitalization: Not since 10/10/2018 10/2018 Back Surgery 09/2018 Complications from back surgery 09/2018 spine surgery 12/2022    Family History: Father: deceased 61 yrs, diagnosed with Hypertension, Diabetes Mother: alive 22 yrs, Gi issues Paternal Grand Father: deceased, stomach ulcers Paternal Grand Mother: deceased, thyroid , Breast cancer, diagnosed with Breast cancer, Diabetes Maternal Grand Father: deceased Maternal Grand Mother: deceased, Breast cancer, diagnosed with Breast cancer Sister 1: alive 4 yrs, asthma Paternal aunt: alive, thyroid  problems:colitis 1 sister(s) . 1 son(s) , 1 daughter(s) - healthy. No colon cancer. late diagnosis of breast cancer in grandmothers. PGF with heart dsease. No Fx H/O Liver disease,colon cancer or colon polyps Family hx of breast cancer on both sides.    Social History:      General Tobacco use: cigarettes: Never smoked, Tobacco history last updated 10/10/2023, Vaping No.  EXPOSURE TO PASSIVE SMOKE: no.   Alcohol: no.   Caffeine: 1 serving daily. Recreational drug use: no.   DIET: no.   Exercise: walks. Marital Status: single.   Children: 1 son [place], has multiple medical problems including adrenal insufficiency), 1 daughter Lovett). EDUCATION: College.   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    ROS:      CONSTITUTIONAL Fatigue none. Fever none today.       CARDIOLOGY Chest pain none.       RESPIRATORY Shortness of breath no. Cough no.       GASTROENTEROLOGY Appetite change none. Change in bowel habits no.       FEMALE REPRODUCTIVE Breast lumps or discharge no. Breast pain none. Dyspareunia none. Dysuria no. Pelvic pain none. Regular menses heavy. Unusual vaginal discharge no. Vaginal itching no. Vulvar/labial lesion no.       NEUROLOGY Migraines none. Tingling/numbness none. Visual changes none.       PSYCHOLOGY Depression no.        SKIN Rash no. Suspicious lesions no.       ENDOCRINOLOGY Hot flashes none. Weight gain no unintentional. Weight loss none.       HEMATOLOGY/LYMPH Anemia no.    Objective:    Vitals: Wt: 150.0, Wt change: -6 lbs, Ht: 62.5, BMI: 27, Pulse sitting: 91, BP sitting: 149/85.    Past Results:    Examination:    Physical Examination:      Chaperone present Chaperone present Plata, Menda 10/10/2023 03:31:11 PM >, for pelvic exam.       GENERAL Patient appears in NAD, pleasant. Build: well developed. General Appearance: , well-appearing. Race: , african-american.       LUNGS Effort: no respiratory distress.       HEART Heart sounds: RRR, no murmur.       ABDOMEN General: no masses,tenderness,organomegaly, no CVAT.       FEMALE GENITOURINARY Adnexa: Left adnexal fullness and tenderness . Anus/perineum: normal, no lesions. Cervix/ cuff: normal appearance , no lesions/discharge/bleeding,good pelvic support , external os normal . External genitalia: normal, no lesions, no skin discoloration, no lymphadenopathy. Rectum: deferred. Urethra: normal external meatus. Uterus: normal size/shape/consistency, freely mobile, non tender. Vagina: pink/moist mucosa, no lesions, no abnormal discharge, odorless. Vulva: normal, no lesions, no skin discoloration, non tender.  EXTREMITIES Extremities FROM of all extremities.       NEUROLOGICAL Gait: normal. Orientation: alert and oriented x 3.    Assessment:    Assessment: Fibroids - D21.9 (Primary)    Adnexal mass - N94.9      Plan:    Treatment:     Fibroids     Notes: Planning robotic-assisted laparoscopic hysterectomy with bilateral salpingo-oophorectomy . She is advised removal of her ovaries will cause surgical menopause. She voiced udnerstanding and desires to have them removed because of recurrent ovarian cyst.. Pt advised she may stay overnight, or 2 days if conversion to larger incision. Discussed risks of hysterectomy including but  not limited to infection, bleeding, conversion to larger incision, damage to her bowel, bladder, or ureters, with the need for further surgery. Discussed risk of blood transfusion and risk of HIV or hep B&C ( with blood transfusion. Pt is aware of risks and desires blood transfusion if needed. Pt advised to avoid NSAIDs (Aspirin , Aleve, Advil , Ibuprofen , Motrin ) from now until surgery given risk of bleeding during surgery. She may take Tylenol  for pain management. She is advised to avoid eating or drinking starting midnight prior to surgery. Discussed post-surgery avoidance of driving for 1 week and avoidance of lifting weight greater than 10 lbs or intercourse for 6-8 weeks after procedure.     Adnexal mass     Notes: Planning robotic-assisted laparoscopic hysterectomy with bilateral salpingo-oophorectomy . She is advised removal of her ovaries will cause surgical menopause. She voiced udnerstanding and desires to have them removed because of recurrent ovarian cyst.. Pt advised she may stay overnight, or 2 days if conversion to larger incision. Discussed risks of hysterectomy including but not limited to infection, bleeding, conversion to larger incision, damage to her bowel, bladder, or ureters, with the need for further surgery. Discussed risk of blood transfusion and risk of HIV or hep B&C ( with blood transfusion. Pt is aware of risks and desires blood transfusion if needed. Pt advised to avoid NSAIDs (Aspirin , Aleve, Advil , Ibuprofen , Motrin ) from now until surgery given risk of bleeding during surgery. She may take Tylenol  for pain management. She is advised to avoid eating or drinking starting midnight prior to surgery. Discussed post-surgery avoidance of driving for 1 week and avoidance of lifting weight greater than 10 lbs or intercourse for 6-8 weeks after procedure.

## 2023-10-15 ENCOUNTER — Encounter (HOSPITAL_COMMUNITY): Payer: Self-pay | Admitting: Obstetrics and Gynecology

## 2023-10-15 ENCOUNTER — Other Ambulatory Visit: Payer: Self-pay

## 2023-10-15 ENCOUNTER — Encounter (HOSPITAL_COMMUNITY)
Admission: RE | Disposition: A | Discharge status: Home / Self Care | Payer: Self-pay | Source: Home / Self Care | Attending: Obstetrics and Gynecology

## 2023-10-15 ENCOUNTER — Observation Stay (HOSPITAL_COMMUNITY): Admitting: Anesthesiology

## 2023-10-15 ENCOUNTER — Observation Stay (HOSPITAL_COMMUNITY)
Admission: RE | Admit: 2023-10-15 | Discharge: 2023-10-17 | Disposition: A | Discharge status: Home / Self Care | Attending: Obstetrics and Gynecology | Admitting: Obstetrics and Gynecology

## 2023-10-15 DIAGNOSIS — D219 Benign neoplasm of connective and other soft tissue, unspecified: Secondary | ICD-10-CM | POA: Diagnosis present

## 2023-10-15 DIAGNOSIS — D259 Leiomyoma of uterus, unspecified: Secondary | ICD-10-CM | POA: Diagnosis not present

## 2023-10-15 DIAGNOSIS — N8311 Corpus luteum cyst of right ovary: Secondary | ICD-10-CM | POA: Insufficient documentation

## 2023-10-15 DIAGNOSIS — N8312 Corpus luteum cyst of left ovary: Secondary | ICD-10-CM | POA: Insufficient documentation

## 2023-10-15 DIAGNOSIS — Z90710 Acquired absence of both cervix and uterus: Secondary | ICD-10-CM | POA: Diagnosis present

## 2023-10-15 DIAGNOSIS — Z01818 Encounter for other preprocedural examination: Principal | ICD-10-CM

## 2023-10-15 DIAGNOSIS — N8003 Adenomyosis of the uterus: Secondary | ICD-10-CM | POA: Diagnosis not present

## 2023-10-15 HISTORY — PX: IUD REMOVAL: SHX5392

## 2023-10-15 HISTORY — DX: Other chronic pain: G89.29

## 2023-10-15 HISTORY — PX: HYSTERECTOMY, TOTAL, LAPAROSCOPIC, ROBOT-ASSISTED WITH SALPINGECTOMY: SHX7587

## 2023-10-15 HISTORY — DX: Lesion of ulnar nerve, bilateral upper limbs: G56.23

## 2023-10-15 HISTORY — DX: Radiculopathy, lumbar region: M54.16

## 2023-10-15 HISTORY — DX: Interstitial cystitis (chronic) without hematuria: N30.10

## 2023-10-15 HISTORY — DX: Leiomyoma of uterus, unspecified: D25.9

## 2023-10-15 HISTORY — DX: Essential tremor: G25.0

## 2023-10-15 HISTORY — PX: CYSTOSCOPY: SHX5120

## 2023-10-15 HISTORY — DX: Presence of spectacles and contact lenses: Z97.3

## 2023-10-15 LAB — TYPE AND SCREEN
ABO/RH(D): B NEG
Antibody Screen: NEGATIVE

## 2023-10-15 LAB — CBC
HCT: 34 % — ABNORMAL LOW (ref 36.0–46.0)
Hemoglobin: 10.3 g/dL — ABNORMAL LOW (ref 12.0–15.0)
MCH: 25.1 pg — ABNORMAL LOW (ref 26.0–34.0)
MCHC: 30.3 g/dL (ref 30.0–36.0)
MCV: 82.9 fL (ref 80.0–100.0)
Platelets: 264 K/uL (ref 150–400)
RBC: 4.1 MIL/uL (ref 3.87–5.11)
RDW: 18.6 % — ABNORMAL HIGH (ref 11.5–15.5)
WBC: 4.5 K/uL (ref 4.0–10.5)
nRBC: 0 % (ref 0.0–0.2)

## 2023-10-15 LAB — POCT PREGNANCY, URINE: Preg Test, Ur: NEGATIVE

## 2023-10-15 SURGERY — HYSTERECTOMY, TOTAL, LAPAROSCOPIC, ROBOT-ASSISTED WITH SALPINGECTOMY
Anesthesia: General | Site: Vagina

## 2023-10-15 MED ORDER — FENTANYL CITRATE (PF) 100 MCG/2ML IJ SOLN
INTRAMUSCULAR | Status: AC
  Filled 2023-10-15: qty 2

## 2023-10-15 MED ORDER — ACETAMINOPHEN 10 MG/ML IV SOLN
1000.0000 mg | Freq: Once | INTRAVENOUS | Status: AC
  Administered 2023-10-15: 1000 mg via INTRAVENOUS

## 2023-10-15 MED ORDER — HYDROMORPHONE HCL 1 MG/ML IJ SOLN
0.2500 mg | INTRAMUSCULAR | Status: DC | PRN
  Administered 2023-10-15 (×4): 0.5 mg via INTRAVENOUS

## 2023-10-15 MED ORDER — KETOROLAC TROMETHAMINE 30 MG/ML IJ SOLN
30.0000 mg | Freq: Four times a day (QID) | INTRAMUSCULAR | Status: AC
  Administered 2023-10-15 – 2023-10-16 (×3): 30 mg via INTRAVENOUS
  Filled 2023-10-15 (×3): qty 1

## 2023-10-15 MED ORDER — CHLORHEXIDINE GLUCONATE 0.12 % MT SOLN
15.0000 mL | Freq: Once | OROMUCOSAL | Status: AC
  Administered 2023-10-15: 15 mL via OROMUCOSAL

## 2023-10-15 MED ORDER — ONDANSETRON HCL 4 MG/2ML IJ SOLN
INTRAMUSCULAR | Status: DC | PRN
  Administered 2023-10-15: 4 mg via INTRAVENOUS

## 2023-10-15 MED ORDER — POVIDONE-IODINE 10 % EX SWAB: 2.0000 | Freq: Once | CUTANEOUS | Status: DC

## 2023-10-15 MED ORDER — OXYCODONE HCL 5 MG PO TABS
7.5000 mg | ORAL_TABLET | Freq: Four times a day (QID) | ORAL | Status: DC | PRN
  Administered 2023-10-16: 15 mg via ORAL
  Administered 2023-10-16: 10 mg via ORAL
  Administered 2023-10-16: 5 mg via ORAL
  Administered 2023-10-16: 10 mg via ORAL
  Administered 2023-10-16 – 2023-10-17 (×3): 15 mg via ORAL
  Filled 2023-10-15 (×3): qty 3
  Filled 2023-10-15: qty 2
  Filled 2023-10-15: qty 3
  Filled 2023-10-15: qty 2
  Filled 2023-10-15: qty 3

## 2023-10-15 MED ORDER — METHYLENE BLUE 20 MG/2ML IV SOSY
PREFILLED_SYRINGE | INTRAVENOUS | Status: DC | PRN
  Administered 2023-10-15: 2 mL via INTRAVENOUS

## 2023-10-15 MED ORDER — SODIUM CHLORIDE (PF) 0.9 % IJ SOLN
INTRAMUSCULAR | Status: AC
  Filled 2023-10-15: qty 50

## 2023-10-15 MED ORDER — GABAPENTIN 100 MG PO CAPS
ORAL_CAPSULE | ORAL | Status: AC
  Filled 2023-10-15: qty 2

## 2023-10-15 MED ORDER — ACETAMINOPHEN 500 MG PO TABS
ORAL_TABLET | ORAL | Status: AC
  Filled 2023-10-15: qty 2

## 2023-10-15 MED ORDER — SODIUM CHLORIDE 0.9 % IV SOLN
INTRAVENOUS | Status: DC | PRN
  Administered 2023-10-15: 50 mL
  Administered 2023-10-15: 10 mL

## 2023-10-15 MED ORDER — DEXAMETHASONE SODIUM PHOSPHATE 10 MG/ML IJ SOLN
INTRAMUSCULAR | Status: AC
  Filled 2023-10-15: qty 1

## 2023-10-15 MED ORDER — OXYCODONE HCL 5 MG PO TABS: 5.0000 mg | ORAL_TABLET | Freq: Once | ORAL | Status: DC | PRN

## 2023-10-15 MED ORDER — HYDROMORPHONE HCL 1 MG/ML IJ SOLN
INTRAMUSCULAR | Status: AC
  Filled 2023-10-15: qty 1

## 2023-10-15 MED ORDER — BUPIVACAINE LIPOSOME 1.3 % IJ SUSP
INTRAMUSCULAR | Status: AC
Start: 2023-10-15 — End: 2023-10-15
  Filled 2023-10-15: qty 20

## 2023-10-15 MED ORDER — KETOROLAC TROMETHAMINE 30 MG/ML IJ SOLN
INTRAMUSCULAR | Status: AC
  Filled 2023-10-15: qty 1

## 2023-10-15 MED ORDER — SENNA 8.6 MG PO TABS
1.0000 | ORAL_TABLET | Freq: Two times a day (BID) | ORAL | Status: DC
  Administered 2023-10-15 – 2023-10-17 (×4): 8.6 mg via ORAL
  Filled 2023-10-15 (×5): qty 1

## 2023-10-15 MED ORDER — IBUPROFEN 600 MG PO TABS
600.0000 mg | ORAL_TABLET | Freq: Four times a day (QID) | ORAL | Status: DC
  Administered 2023-10-16 – 2023-10-17 (×4): 600 mg via ORAL
  Filled 2023-10-15 (×4): qty 1

## 2023-10-15 MED ORDER — KETOROLAC TROMETHAMINE 30 MG/ML IJ SOLN
30.0000 mg | Freq: Once | INTRAMUSCULAR | Status: AC
  Administered 2023-10-15: 30 mg via INTRAVENOUS

## 2023-10-15 MED ORDER — CHLORHEXIDINE GLUCONATE 0.12 % MT SOLN
OROMUCOSAL | Status: AC
  Filled 2023-10-15: qty 15

## 2023-10-15 MED ORDER — ACETAMINOPHEN 500 MG PO TABS
1000.0000 mg | ORAL_TABLET | ORAL | Status: AC
  Administered 2023-10-15: 1000 mg via ORAL

## 2023-10-15 MED ORDER — ORAL CARE MOUTH RINSE: 15.0000 mL | Freq: Once | OROMUCOSAL | Status: AC

## 2023-10-15 MED ORDER — DEXAMETHASONE SODIUM PHOSPHATE 10 MG/ML IJ SOLN
INTRAMUSCULAR | Status: DC | PRN
Start: 2023-10-15 — End: 2023-10-15
  Administered 2023-10-15: 10 mg via INTRAVENOUS

## 2023-10-15 MED ORDER — BUPIVACAINE LIPOSOME 1.3 % IJ SUSP
INTRAMUSCULAR | Status: DC | PRN
  Administered 2023-10-15: 50 mL

## 2023-10-15 MED ORDER — ONDANSETRON HCL 4 MG/2ML IJ SOLN
INTRAMUSCULAR | Status: AC
  Filled 2023-10-15: qty 2

## 2023-10-15 MED ORDER — OXYCODONE HCL 5 MG/5ML PO SOLN: 5.0000 mg | Freq: Once | ORAL | Status: DC | PRN

## 2023-10-15 MED ORDER — ROCURONIUM BROMIDE 10 MG/ML (PF) SYRINGE
PREFILLED_SYRINGE | INTRAVENOUS | Status: AC
  Filled 2023-10-15: qty 10

## 2023-10-15 MED ORDER — FENTANYL CITRATE (PF) 250 MCG/5ML IJ SOLN
INTRAMUSCULAR | Status: AC
  Filled 2023-10-15: qty 5

## 2023-10-15 MED ORDER — ONDANSETRON HCL 4 MG PO TABS: 4.0000 mg | ORAL_TABLET | Freq: Four times a day (QID) | ORAL | Status: DC | PRN

## 2023-10-15 MED ORDER — MIDAZOLAM HCL 2 MG/2ML IJ SOLN
INTRAMUSCULAR | Status: DC | PRN
  Administered 2023-10-15: 2 mg via INTRAVENOUS

## 2023-10-15 MED ORDER — BUPROPION HCL ER (XL) 150 MG PO TB24
150.0000 mg | ORAL_TABLET | Freq: Every day | ORAL | Status: DC
  Administered 2023-10-16 – 2023-10-17 (×2): 150 mg via ORAL
  Filled 2023-10-15 (×2): qty 1

## 2023-10-15 MED ORDER — SIMETHICONE 80 MG PO CHEW
80.0000 mg | CHEWABLE_TABLET | Freq: Four times a day (QID) | ORAL | Status: DC | PRN
  Administered 2023-10-15: 80 mg via ORAL
  Filled 2023-10-15: qty 1

## 2023-10-15 MED ORDER — LACTATED RINGERS IV SOLN: INTRAVENOUS | Status: AC

## 2023-10-15 MED ORDER — SODIUM CHLORIDE 0.9 % IV SOLN: INTRAVENOUS | Status: DC | PRN

## 2023-10-15 MED ORDER — ROCURONIUM BROMIDE 10 MG/ML (PF) SYRINGE
PREFILLED_SYRINGE | INTRAVENOUS | Status: DC | PRN
Start: 2023-10-15 — End: 2023-10-15
  Administered 2023-10-15: 20 mg via INTRAVENOUS
  Administered 2023-10-15: 60 mg via INTRAVENOUS

## 2023-10-15 MED ORDER — PHENYLEPHRINE 80 MCG/ML (10ML) SYRINGE FOR IV PUSH (FOR BLOOD PRESSURE SUPPORT)
PREFILLED_SYRINGE | INTRAVENOUS | Status: DC | PRN
  Administered 2023-10-15: 80 ug via INTRAVENOUS
  Administered 2023-10-15: 160 ug via INTRAVENOUS

## 2023-10-15 MED ORDER — OXYCODONE HCL 5 MG PO TABS
10.0000 mg | ORAL_TABLET | Freq: Once | ORAL | Status: AC
  Administered 2023-10-15: 10 mg via ORAL

## 2023-10-15 MED ORDER — SODIUM CHLORIDE 0.9 % IV SOLN
2.0000 g | INTRAVENOUS | Status: AC
  Administered 2023-10-15: 2 g via INTRAVENOUS
  Filled 2023-10-15: qty 2

## 2023-10-15 MED ORDER — GABAPENTIN 300 MG PO CAPS
300.0000 mg | ORAL_CAPSULE | ORAL | Status: AC
  Administered 2023-10-15: 200 mg via ORAL

## 2023-10-15 MED ORDER — HYDROMORPHONE HCL 1 MG/ML IJ SOLN
0.2000 mg | INTRAMUSCULAR | Status: DC | PRN
  Administered 2023-10-15 – 2023-10-17 (×8): 0.6 mg via INTRAVENOUS
  Filled 2023-10-15 (×8): qty 1

## 2023-10-15 MED ORDER — LIDOCAINE 2% (20 MG/ML) 5 ML SYRINGE
INTRAMUSCULAR | Status: DC | PRN
  Administered 2023-10-15: 60 mg via INTRAVENOUS

## 2023-10-15 MED ORDER — ONDANSETRON HCL 4 MG/2ML IJ SOLN
4.0000 mg | Freq: Four times a day (QID) | INTRAMUSCULAR | Status: DC | PRN
  Administered 2023-10-16: 4 mg via INTRAVENOUS
  Filled 2023-10-15: qty 2

## 2023-10-15 MED ORDER — ACETAMINOPHEN 500 MG PO TABS
1000.0000 mg | ORAL_TABLET | Freq: Four times a day (QID) | ORAL | Status: DC
  Administered 2023-10-15 – 2023-10-17 (×7): 1000 mg via ORAL
  Filled 2023-10-15 (×7): qty 2

## 2023-10-15 MED ORDER — ALUM & MAG HYDROXIDE-SIMETH 200-200-20 MG/5ML PO SUSP
30.0000 mL | ORAL | Status: DC | PRN
  Administered 2023-10-15: 30 mL via ORAL
  Filled 2023-10-15: qty 30

## 2023-10-15 MED ORDER — FENTANYL CITRATE (PF) 100 MCG/2ML IJ SOLN
25.0000 ug | INTRAMUSCULAR | Status: DC | PRN
  Administered 2023-10-15 (×3): 50 ug via INTRAVENOUS

## 2023-10-15 MED ORDER — ACETAMINOPHEN 10 MG/ML IV SOLN
INTRAVENOUS | Status: AC
  Filled 2023-10-15: qty 100

## 2023-10-15 MED ORDER — PANTOPRAZOLE SODIUM 40 MG PO TBEC
40.0000 mg | DELAYED_RELEASE_TABLET | Freq: Every day | ORAL | Status: DC
  Administered 2023-10-15 – 2023-10-17 (×3): 40 mg via ORAL
  Filled 2023-10-15 (×3): qty 1

## 2023-10-15 MED ORDER — PROPOFOL 10 MG/ML IV BOLUS
INTRAVENOUS | Status: DC | PRN
  Administered 2023-10-15: 200 mg via INTRAVENOUS

## 2023-10-15 MED ORDER — LIDOCAINE 2% (20 MG/ML) 5 ML SYRINGE
INTRAMUSCULAR | Status: AC
  Filled 2023-10-15: qty 5

## 2023-10-15 MED ORDER — BUPIVACAINE HCL (PF) 0.25 % IJ SOLN
INTRAMUSCULAR | Status: AC
  Filled 2023-10-15: qty 30

## 2023-10-15 MED ORDER — FENTANYL CITRATE (PF) 250 MCG/5ML IJ SOLN
INTRAMUSCULAR | Status: DC | PRN
  Administered 2023-10-15: 150 ug via INTRAVENOUS
  Administered 2023-10-15 (×2): 50 ug via INTRAVENOUS

## 2023-10-15 MED ORDER — PROPOFOL 10 MG/ML IV BOLUS
INTRAVENOUS | Status: AC
  Filled 2023-10-15: qty 20

## 2023-10-15 MED ORDER — FLUORESCEIN SODIUM 10 % IV SOLN
500.0000 mg | Freq: Once | INTRAVENOUS | Status: DC
  Filled 2023-10-15: qty 5

## 2023-10-15 MED ORDER — SCOPOLAMINE 1 MG/3DAYS TD PT72
1.0000 | MEDICATED_PATCH | TRANSDERMAL | Status: DC
  Administered 2023-10-15: 1 mg via TRANSDERMAL

## 2023-10-15 MED ORDER — METHOCARBAMOL 500 MG PO TABS
500.0000 mg | ORAL_TABLET | Freq: Three times a day (TID) | ORAL | Status: DC | PRN
  Administered 2023-10-15 – 2023-10-17 (×4): 500 mg via ORAL
  Filled 2023-10-15 (×5): qty 1

## 2023-10-15 MED ORDER — GABAPENTIN 100 MG PO CAPS
100.0000 mg | ORAL_CAPSULE | Freq: Four times a day (QID) | ORAL | Status: DC
  Administered 2023-10-15 – 2023-10-17 (×8): 100 mg via ORAL
  Filled 2023-10-15 (×8): qty 1

## 2023-10-15 MED ORDER — ROPIVACAINE HCL 5 MG/ML IJ SOLN
INTRAMUSCULAR | Status: AC
Start: 2023-10-15 — End: 2023-10-15
  Filled 2023-10-15: qty 30

## 2023-10-15 MED ORDER — SCOPOLAMINE 1 MG/3DAYS TD PT72
MEDICATED_PATCH | TRANSDERMAL | Status: AC
  Filled 2023-10-15: qty 1

## 2023-10-15 MED ORDER — METHYLENE BLUE 20 MG/2ML IV SOSY
PREFILLED_SYRINGE | INTRAVENOUS | Status: AC
  Filled 2023-10-15: qty 2

## 2023-10-15 MED ORDER — SUGAMMADEX SODIUM 200 MG/2ML IV SOLN
INTRAVENOUS | Status: DC | PRN
  Administered 2023-10-15: 200 mg via INTRAVENOUS
  Administered 2023-10-15: 100 mg via INTRAVENOUS

## 2023-10-15 MED ORDER — KETOROLAC TROMETHAMINE 30 MG/ML IJ SOLN: 30.0000 mg | Freq: Once | INTRAMUSCULAR | Status: DC | PRN

## 2023-10-15 MED ORDER — MENTHOL 3 MG MT LOZG
1.0000 | LOZENGE | OROMUCOSAL | Status: DC | PRN
  Administered 2023-10-15: 3 mg via ORAL
  Filled 2023-10-15: qty 9

## 2023-10-15 MED ORDER — OXYCODONE HCL 5 MG PO TABS
ORAL_TABLET | ORAL | Status: AC
  Filled 2023-10-15: qty 2

## 2023-10-15 MED ORDER — ENOXAPARIN SODIUM 40 MG/0.4ML IJ SOSY
40.0000 mg | PREFILLED_SYRINGE | INTRAMUSCULAR | Status: DC
  Administered 2023-10-16 – 2023-10-17 (×2): 40 mg via SUBCUTANEOUS
  Filled 2023-10-15 (×2): qty 0.4

## 2023-10-15 MED ORDER — LACTATED RINGERS IV SOLN: INTRAVENOUS | Status: DC

## 2023-10-15 MED ORDER — SODIUM CHLORIDE 0.9 % IR SOLN
Status: DC | PRN
  Administered 2023-10-15: 1000 mL

## 2023-10-15 MED ORDER — AMISULPRIDE (ANTIEMETIC) 5 MG/2ML IV SOLN: 10.0000 mg | Freq: Once | INTRAVENOUS | Status: DC | PRN

## 2023-10-15 MED ORDER — MIDAZOLAM HCL 2 MG/2ML IJ SOLN
INTRAMUSCULAR | Status: AC
  Filled 2023-10-15: qty 2

## 2023-10-15 SURGICAL SUPPLY — 58 items
BARRIER ADHS 3X4 INTERCEED (GAUZE/BANDAGES/DRESSINGS) IMPLANT
CANNULA CAP OBTURATR AIRSEAL 8 (CAP) ×4 IMPLANT
CELLS DAT CNTRL 66122 CELL SVR (MISCELLANEOUS) IMPLANT
COVER BACK TABLE 60X90IN (DRAPES) ×4 IMPLANT
COVER TIP SHEARS 8 DVNC (MISCELLANEOUS) ×4 IMPLANT
DEFOGGER SCOPE WARM SEASHARP (MISCELLANEOUS) ×4 IMPLANT
DERMABOND ADVANCED .7 DNX12 (GAUZE/BANDAGES/DRESSINGS) ×4 IMPLANT
DILATOR CANAL MILEX (MISCELLANEOUS) ×4 IMPLANT
DRAPE ARM DVNC X/XI (DISPOSABLE) ×16 IMPLANT
DRAPE COLUMN DVNC XI (DISPOSABLE) ×4 IMPLANT
DRAPE SURG IRRIG POUCH 19X23 (DRAPES) ×4 IMPLANT
DRAPE UTILITY 15X26 TOWEL STRL (DRAPES) ×4 IMPLANT
DRIVER NDL MEGA 8 DVNC XI (INSTRUMENTS) ×4 IMPLANT
DRIVER NDLE MEGA DVNC XI (INSTRUMENTS) ×3 IMPLANT
DURAPREP 26ML APPLICATOR (WOUND CARE) ×4 IMPLANT
ELECTRODE REM PT RTRN 9FT ADLT (ELECTROSURGICAL) ×4 IMPLANT
FORCEPS BPLR LNG DVNC XI (INSTRUMENTS) ×4 IMPLANT
FORCEPS PROGRASP DVNC XI (FORCEP) IMPLANT
GAUZE 4X4 16PLY ~~LOC~~+RFID DBL (SPONGE) IMPLANT
GLOVE BIOGEL M 6.5 STRL (GLOVE) ×12 IMPLANT
GLOVE BIOGEL PI IND STRL 6.5 (GLOVE) ×12 IMPLANT
GRASPER COBRA DVNC RU (INSTRUMENTS) ×4 IMPLANT
HIBICLENS CHG 4% 4OZ BTL (MISCELLANEOUS) ×8 IMPLANT
IRRIGATION STRYKERFLOW (MISCELLANEOUS) ×4 IMPLANT
IRRIGATION SUCT STRKRFLW 2 WTP (MISCELLANEOUS) IMPLANT
IV NS 1000ML BAXH (IV SOLUTION) IMPLANT
KIT PINK PAD W/HEAD ARM REST (MISCELLANEOUS) ×4 IMPLANT
LEGGING LITHOTOMY PAIR STRL (DRAPES) ×4 IMPLANT
MANIPULATOR ADVINCU DEL 2.5 PL (MISCELLANEOUS) IMPLANT
MANIPULATOR ADVINCU DEL 3.0 PL (MISCELLANEOUS) IMPLANT
MANIPULATOR ADVINCU DEL 3.5 PL (MISCELLANEOUS) IMPLANT
MANIPULATOR ADVINCU DEL 4.0 PL (MISCELLANEOUS) IMPLANT
NS IRRIG 1000ML POUR BTL (IV SOLUTION) IMPLANT
OBTURATOR OPTICALSTD 8 DVNC (TROCAR) ×4 IMPLANT
OCCLUDER COLPOPNEUMO (BALLOONS) IMPLANT
PACK ROBOT WH (CUSTOM PROCEDURE TRAY) ×4 IMPLANT
PACK ROBOTIC GOWN (GOWN DISPOSABLE) ×4 IMPLANT
PAD OB MATERNITY 11 LF (PERSONAL CARE ITEMS) ×4 IMPLANT
POWDER SURGICEL 3.0 GRAM (HEMOSTASIS) IMPLANT
RETRACTOR WND ALEXIS 18 MED (MISCELLANEOUS) IMPLANT
SCISSORS LAP 5X45 EPIX DISP (ENDOMECHANICALS) ×4 IMPLANT
SCISSORS MNPLR CVD DVNC XI (INSTRUMENTS) ×4 IMPLANT
SEAL UNIV 5-12 XI (MISCELLANEOUS) ×8 IMPLANT
SEALER VESSEL EXT DVNC XI (MISCELLANEOUS) IMPLANT
SET IRRIG Y TYPE TUR BLADDER L (SET/KITS/TRAYS/PACK) IMPLANT
SET TRI-LUMEN FLTR TB AIRSEAL (TUBING) IMPLANT
SET TUBE FILTERED XL AIRSEAL (SET/KITS/TRAYS/PACK) ×4 IMPLANT
SPIKE FLUID TRANSFER (MISCELLANEOUS) ×8 IMPLANT
SUT VIC AB 0 CT1 27XBRD ANBCTR (SUTURE) ×8 IMPLANT
SUT VICRYL 0 UR6 27IN ABS (SUTURE) IMPLANT
SUT VICRYL RAPIDE 4/0 PS 2 (SUTURE) ×8 IMPLANT
SUT VLOC 180 0 9IN GS21 (SUTURE) ×4 IMPLANT
TIP ENDOSCOPIC SURGICEL (TIP) IMPLANT
TOWEL GREEN STERILE (TOWEL DISPOSABLE) ×4 IMPLANT
TRAY FOLEY W/BAG SLVR 14FR LF (SET/KITS/TRAYS/PACK) IMPLANT
TROCAR PORT AIRSEAL 8X120 (TROCAR) IMPLANT
UNDERPAD 30X36 HEAVY ABSORB (UNDERPADS AND DIAPERS) ×4 IMPLANT
WATER STERILE IRR 1000ML POUR (IV SOLUTION) ×4 IMPLANT

## 2023-10-15 NOTE — Transfer of Care (Signed)
 Immediate Anesthesia Transfer of Care Note  Patient: Laura Shah  Procedure(s) Performed: HYSTERECTOMY, TOTAL, LAPAROSCOPIC, ROBOT-ASSISTED WITH SALPINGO-OOPHORECTOMY (Bilateral) REMOVAL, INTRAUTERINE DEVICE (Vagina ) CYSTOSCOPY (Bladder)  Patient Location: PACU  Anesthesia Type:MAC  Level of Consciousness: awake, alert , patient cooperative, and responds to stimulation  Airway & Oxygen Therapy: Patient Spontanous Breathing  Post-op Assessment: Report given to RN and Post -op Vital signs reviewed and stable  Post vital signs: Reviewed and stable  Last Vitals:  Vitals Value Taken Time  BP 147/91 10/15/23 15:36  Temp 36.7 C 10/15/23 15:36  Pulse 80 10/15/23 15:38  Resp 10 10/15/23 15:38  SpO2 97 % 10/15/23 15:38  Vitals shown include unfiled device data.  Last Pain:  Vitals:   10/15/23 1131  TempSrc: Oral  PainSc: 6       Patients Stated Pain Goal: 4 (10/15/23 1131)  Complications: No notable events documented.

## 2023-10-15 NOTE — Anesthesia Procedure Notes (Signed)
 Procedure Name: Intubation Date/Time: 10/15/2023 1:38 PM  Performed by: Venezia Sargeant C, CRNAPre-anesthesia Checklist: Patient identified, Emergency Drugs available, Suction available and Patient being monitored Patient Re-evaluated:Patient Re-evaluated prior to induction Oxygen Delivery Method: Circle system utilized Preoxygenation: Pre-oxygenation with 100% oxygen Induction Type: IV induction Ventilation: Mask ventilation without difficulty Laryngoscope Size: Mac and 3 Grade View: Grade I Nasal Tubes: Nasal prep performed and Nasal Rae Number of attempts: 1 Airway Equipment and Method: Stylet and Oral airway Placement Confirmation: ETT inserted through vocal cords under direct vision, positive ETCO2 and breath sounds checked- equal and bilateral Secured at: 22 cm Tube secured with: Tape Dental Injury: Teeth and Oropharynx as per pre-operative assessment

## 2023-10-15 NOTE — Op Note (Addendum)
 10/15/2023  3:38 PM  PATIENT:  Laura Shah  43 y.o. female  PRE-OPERATIVE DIAGNOSIS:  Fibroids  POST-OPERATIVE DIAGNOSIS:  * No post-op diagnosis entered *  PROCEDURE:  Procedure(s): HYSTERECTOMY, TOTAL, LAPAROSCOPIC, ROBOT-ASSISTED WITH SALPINGO-OOPHORECTOMY (Bilateral) REMOVAL, INTRAUTERINE DEVICE (N/A) CYSTOSCOPY (N/A)  SURGEON:  Surgeons and Role:    DEWAINE Rosalva Sawyer, MD - Primary  PHYSICIAN ASSISTANT:   ASSISTANTS: Megan RNFA An experienced assistant was required given the standard of surgical care given the complexity of the case.  This assistant was needed for exposure, dissection, suctioning, retraction, instrument exchange, assisting with delivery with administration of fundal pressure, and for overall help during the procedure.    ANESTHESIA:   general  EBL:  50 cc    BLOOD ADMINISTERED:none  DRAINS: Urinary Catheter (Foley)   LOCAL MEDICATIONS USED:  OTHER Ropivicaine/ Exparel  with Marcaine    SPECIMEN:  Source of Specimen:  Uterus cervix and bilateral fallopian tubes and ovaries   DISPOSITION OF SPECIMEN:  PATHOLOGY  COUNTS:  YES  TOURNIQUET:  * No tourniquets in log *  DICTATION: .Note written in EPIC  PLAN OF CARE: Discharge to home after PACU  PATIENT DISPOSITION:  PACU - hemodynamically stable.   Delay start of Pharmacological VTE agent (>24hrs) due to surgical blood loss or risk of bleeding: not applicable  Findings: Normal external genitalia, vaginal mucosa and cervix. Enlarged fibroid uterus. Complex cyst on the left ovary. Normal appearing Right ovary   Procedure: The patient was taken to the operating room  #6 at [facility] where she was placed under general anesthesia.Time out was performed. Laura Shah She was placed in dorsal lithotomy position and prepped and draped in the usual sterile fashion. A weighted speculum was placed into the vagina. A Deaver was placed anteriorly for retraction. The anterior lip of the cervix was grasped with a  single-tooth tenaculum. The Mirena  IUD was removed without difficulty.  The vaginal mucosa was injected with 2.5 cc of exparil/marcaine  at the 2/4/ 8 and 10 o'clock positions. The uterus was sounded to 8 cm. the cervix was dilated to 6 mm . 0 vicryl suture placed at the 12  position of the cervix to facilitate placement of a uterine manipulator. The manipulator was placed without difficulty. Weighted speculum and Deaver were removed.   Attention was turned to the patient's abdomen where a 8 mm trocar was placed at the umbilicus under direct visualization . The pneumoperitoneum was achieved with PCO2 gas.  An 8 mm trocar was placed in the right upper quadrant 10 centimeters from the umbilicus.later connected to robotic arm #4). An 8 mm incision was made in the left upper quadrant 10 cm from the umbilicus and connected to robot arm #2. ( All incision sites were injected with 10cc of exparel /marcaine  prior to port placement. )   Once all ports had been placed under direct visualization.The laparoscope was removed and the da Vinci robotic system was thin right-sided docked. The robotic arms were connected to the corresponding trocars as listed above. The laparoscope was then reinserted. The long tip bipolar forceps were placed into port #1. A vessel sealerwas placed in port #3. All instruments were directed into the pelvis under direct visualization.  Attention was turned to the surgeons console.. The  left infundibulo pelvic  ligament was cauterized and transected with the vessel sealer The broad ligament was cauterized and transected with the vessel sealer .The round ligament was cauterized and transected with the vessel sealer  The anterior leaf of broad ligament  was incised along the bladder reflection to the midline.  The right infundibulo- pelvic  ligament was cauterized and transected with the vessel sealer. The right broad ligament was cauterized and transected with the vessel sealer. The right round  ligament was cauterized and transected with the vessel sealer The broad ligament was incised to the midline.   The bladder was dissected off the lower uterine segments of the cervix via sharp and blunt dissection.   The uterine arteries were skeleton bilaterally. They were  cauterized and transected with the vessel sealer The KOH ring was identified. The anterior colpotomy was performed followed by the posterior colpotomy. Once the uterus, cervix  bilateral fallopian tubes and ovaires were completely excised, they  were removed through the vagina. The  bipolar forceps and scissors were removed and cobra forceps were placed in the port #2 and the mega needle driver was placed in to port #4.  The vaginal cuff was closed with running suture if 0 v-lock. The pelvis was irrigated. All pelvic pedicles were examined and hemostasis was noted.  All instruments removed from the ports. All ports were removed under direct Visualization. The pneumoperitoneum was released. The skin incisions were closed with 4-0 Vicryl and then covered with Derma bond.   Cystoscopy was performed using a 30 Degree scope. Methylene blue  was given IV. The ureteral orifices were identified and they both expressed Methylene blue . The cystoscope was removed.     Sponge lap and needle counts weIre correct x 2. The patient was awakened from anesthesia and taken to the recovery room in stable condition.

## 2023-10-15 NOTE — Anesthesia Preprocedure Evaluation (Addendum)
 Anesthesia Evaluation  Patient identified by MRN, date of birth, ID band Patient awake    Reviewed: Allergy & Precautions, NPO status , Patient's Chart, lab work & pertinent test results  Airway Mallampati: II  TM Distance: >3 FB Neck ROM: Full    Dental no notable dental hx.    Pulmonary sleep apnea    Pulmonary exam normal        Cardiovascular negative cardio ROS Normal cardiovascular exam     Neuro/Psych   Anxiety      Neuromuscular disease    GI/Hepatic Neg liver ROS,GERD  Controlled and Medicated,,  Endo/Other  negative endocrine ROS    Renal/GU negative Renal ROS     Musculoskeletal negative musculoskeletal ROS (+)    Abdominal   Peds  Hematology  (+) Blood dyscrasia, anemia   Anesthesia Other Findings Fibroids  Reproductive/Obstetrics Hcg negative                              Anesthesia Physical Anesthesia Plan  ASA: 2  Anesthesia Plan: General   Post-op Pain Management:    Induction: Intravenous  PONV Risk Score and Plan: 4 or greater and Ondansetron , Dexamethasone , Midazolam , Scopolamine  patch - Pre-op and Treatment may vary due to age or medical condition  Airway Management Planned: Oral ETT  Additional Equipment:   Intra-op Plan:   Post-operative Plan: Extubation in OR  Informed Consent: I have reviewed the patients History and Physical, chart, labs and discussed the procedure including the risks, benefits and alternatives for the proposed anesthesia with the patient or authorized representative who has indicated his/her understanding and acceptance.     Dental advisory given  Plan Discussed with: CRNA  Anesthesia Plan Comments:          Anesthesia Quick Evaluation

## 2023-10-15 NOTE — H&P (Signed)
 Date of Initial H&P: 10/14/2023  History reviewed, patient examined, no change in status, stable for surgery.

## 2023-10-16 ENCOUNTER — Other Ambulatory Visit (HOSPITAL_COMMUNITY): Payer: Self-pay

## 2023-10-16 ENCOUNTER — Encounter: Payer: Self-pay | Admitting: Pulmonary Disease

## 2023-10-16 ENCOUNTER — Encounter: Payer: Self-pay | Admitting: Family

## 2023-10-16 ENCOUNTER — Encounter (HOSPITAL_COMMUNITY): Payer: Self-pay | Admitting: Obstetrics and Gynecology

## 2023-10-16 DIAGNOSIS — N8312 Corpus luteum cyst of left ovary: Secondary | ICD-10-CM | POA: Diagnosis not present

## 2023-10-16 DIAGNOSIS — D259 Leiomyoma of uterus, unspecified: Secondary | ICD-10-CM | POA: Diagnosis not present

## 2023-10-16 DIAGNOSIS — N8311 Corpus luteum cyst of right ovary: Secondary | ICD-10-CM | POA: Diagnosis not present

## 2023-10-16 DIAGNOSIS — N8003 Adenomyosis of the uterus: Secondary | ICD-10-CM | POA: Diagnosis not present

## 2023-10-16 LAB — HEMOGLOBIN: Hemoglobin: 9.2 g/dL — ABNORMAL LOW (ref 12.0–15.0)

## 2023-10-16 MED ORDER — IBUPROFEN 600 MG PO TABS
600.0000 mg | ORAL_TABLET | Freq: Four times a day (QID) | ORAL | 0 refills | Status: AC | PRN
  Filled 2023-10-16: qty 30, 8d supply, fill #0

## 2023-10-16 MED ORDER — ENOXAPARIN SODIUM 40 MG/0.4ML IJ SOSY
40.0000 mg | PREFILLED_SYRINGE | INTRAMUSCULAR | 0 refills | Status: AC
  Filled 2023-10-16: qty 11.2, 28d supply, fill #0

## 2023-10-16 MED ORDER — DIPHENHYDRAMINE HCL 25 MG PO CAPS
50.0000 mg | ORAL_CAPSULE | Freq: Three times a day (TID) | ORAL | Status: DC | PRN
  Administered 2023-10-16 – 2023-10-17 (×2): 50 mg via ORAL
  Filled 2023-10-16 (×3): qty 2

## 2023-10-16 MED ORDER — ACETAMINOPHEN 500 MG PO TABS
1000.0000 mg | ORAL_TABLET | Freq: Three times a day (TID) | ORAL | 0 refills | Status: AC | PRN
  Filled 2023-10-16: qty 30, 5d supply, fill #0

## 2023-10-16 NOTE — Progress Notes (Signed)
 1 Day Post-Op Procedure(s) (LRB): HYSTERECTOMY, TOTAL, LAPAROSCOPIC, ROBOT-ASSISTED WITH SALPINGO-OOPHORECTOMY (Bilateral) REMOVAL, INTRAUTERINE DEVICE (N/A) CYSTOSCOPY (N/A)  Subjective: Patient reports incisional pain, tolerating PO, + flatus, and no problems voiding.   She is still having problems with intermittent pain. Pain score was 10 an hour ago is now down to a 5 after IV dilaudid . She has not ambulated other than to the bathroom but I motivated to ambulate more.    Objective: I have reviewed patient's vital signs, intake and output, medications, and labs.  General: alert, cooperative, and no distress Resp: no distress  GI: soft tender to mild palpation. No guarding +BS in all 4 quadrants.  Extremities: extremities normal, atraumatic, no cyanosis or edema Incisions: well approximated no erythema or exudate.   Assessment: s/p Procedure(s): HYSTERECTOMY, TOTAL, LAPAROSCOPIC, ROBOT-ASSISTED WITH SALPINGO-OOPHORECTOMY (Bilateral) REMOVAL, INTRAUTERINE DEVICE (N/A) CYSTOSCOPY (N/A): stable and pain control is not optimized   Plan: Encourage ambulation Advance to PO medication Discontinue IV fluids Plan to keep overnight to optimize pain control.   LOS: 1 day    Rexene Hoit, MD 10/16/2023, 10:11 PM

## 2023-10-16 NOTE — Discharge Summary (Signed)
 Physician Discharge Summary  Patient ID: Laura Shah MRN: [id number] DOB/AGE: Jun 12, 1980 43 y.o.  Admit date: 10/15/2023 Discharge date: 10/17/2023  Admission Diagnoses: Fibroids/ Abnormal Uterine Bleeding.   Discharge Diagnoses:  Principal Problem:   Fibroids Active Problems:   S/P laparoscopic hysterectomy   Discharged Condition: stable  Hospital Course: Pt was admitted for observation after undergoing a robotic assisted laparoscopic hysterectomy with bilateral salpingo-oophorectomy on 10/15/2023 . She had issues with pain management on POD #1 and was kept an additional day to work on pain control. This improved. On Pod #2 she developed a reaction to surgical Glue that was over the incisions. An attempt was made to remove the glue with mineral oil however this was unsuccessful. She was started on a medrol  dose pack and asked to follow up in the office.   Consults: None  Significant Diagnostic Studies: labs:  Recent Results (from the past 2160 hours)  Pregnancy, urine POC     Status: None   Collection Time: 10/15/23 11:11 AM  Result Value Ref Range   Preg Test, Ur NEGATIVE NEGATIVE    Comment:        THE SENSITIVITY OF THIS METHODOLOGY IS >20 mIU/mL.   CBC per protocol     Status: Abnormal   Collection Time: 10/15/23 12:17 PM  Result Value Ref Range   WBC 4.5 4.0 - 10.5 K/uL   RBC 4.10 3.87 - 5.11 MIL/uL   Hemoglobin 10.3 (L) 12.0 - 15.0 g/dL   HCT 65.9 (L) 63.9 - 53.9 %   MCV 82.9 80.0 - 100.0 fL   MCH 25.1 (L) 26.0 - 34.0 pg   MCHC 30.3 30.0 - 36.0 g/dL   RDW 81.3 (H) 88.4 - 84.4 %   Platelets 264 150 - 400 K/uL   nRBC 0.0 0.0 - 0.2 %    Comment: Performed at [facility], [address], [place], [place] [address]  Type and screen     Status: None   Collection Time: 10/15/23 12:17 PM  Result Value Ref Range   ABO/RH(D) B NEG    Antibody Screen NEG    Sample Expiration      10/18/2023,2359 Performed at [facility], [address]  [place], [place] [address]   Surgical pathology     Status: None   Collection Time: 10/15/23  2:42 PM  Result Value Ref Range   SURGICAL PATHOLOGY      SURGICAL PATHOLOGY CASE: [id number] PATIENT: MARIT CROAK Surgical Pathology Report     Clinical History: fibroids (cm)     FINAL MICROSCOPIC DIAGNOSIS:  A. UTERUS AND CERVIX, WITH BILATERAL FALLOPIAN TUBES AND OVARIES, HYSTERECTOMY: - Inactive endometrium with pseudodecidualization, consistent with exogenous hormone effect. - Leiomyomata and adenomyosis. - Cervix with no specific pathologic change. - Ovaries with bilateral cystic corpus lutea. - Fallopian tubes with no specific pathologic change.  GROSS DESCRIPTION:  Specimen: Uterus, cervix, bilateral fallopian tubes and ovaries received fresh Specimen integrity: Intact Size and shape: 7.5 x 4.0 x 3.5 cm and symmetrical Weight: 135 g Serosa: Tan-pink and smooth Cervix: 3.5 x 3.0 cm with a 0.6 cm slit-like patent os Endometrium: 2.5 x 1.0 cm with a tan-pink unremarkable appearance. Sectioning reveals a 0.1 cm thick endometrium. Myometrium: The myometrium is 2.7 cm thick and remarkable for appr oximately 5 tan-white well-circumscribed intramural nodules measuring up to 2.0 cm in greatest dimension.  The nodules do not display any hemorrhage or necrosis. Right adnexa: The right adnexa weighs 25 g and  consists of a 4.8 x 3.2 x 2.7 cm ovary with attached 3.3 cm long by 0.6 cm diameter fimbriated fallopian tube.  The serosal surface of the ovary is tan-pink and lobulated.  The ovary is sectioned to reveal 1 hemorrhagic cyst measuring up to 2.8 cm in greatest dimension.  The remainder of the ovary is unremarkable.  The serosal surface of the fallopian tube is tan-pink and smooth.  The fallopian tube is serially sectioned and appears grossly unremarkable. Left adnexa: The left adnexa weighs 15.0 g and consists of a 3.4 x 3.2 x 2.2 cm ovary with attached 4.5 cm long by  0.6 cm diameter fimbriated fallopian tube.  The serosal surface of the ovary is tan-pink and lobulated.  The ovary is sectioned to reveal 2 cysts filled with pink-tan thickened fluid.  There are no excr escences grossly identified. The remainder of the ovary is unremarkable.  The serosal surface of the fallopian tube is tan-pink and smooth.  The fallopian tube is serially sectioned and appears grossly unremarkable. Block Summary: A1, anterior and posterior cervix transition zone. A2, anterior and posterior endometrium. A3, anterior and posterior serosa. A4, intramural nodules. A5, right ovary cyst. A6, uninvolved right ovary and right fallopian tube (fimbria entirely submitted). A7, left ovary with cysts. A8, left fallopian tube (fimbria entirely submitted). (WC 10/16/2023)   Final Diagnosis performed by Wally Highland, MD.   Electronically signed 10/19/2023 Technical and / or Professional components performed at [facility]. [facility], [address], [place], [place] [address].  Immunohistochemistry Technical component (if applicable) was performed at [facility]. [address], [place], [place] [address].   IMMUNOHISTOCHEMISTR Y DISCLAIMER (if applicable): Some of these immunohistochemical stains may have been developed and the performance characteristics determine by [facility]. Some may not have been cleared or approved by the U.S. Food and Drug Administration. The FDA has determined that such clearance or approval is not necessary. This test is used for clinical purposes. It should not be regarded as investigational or for research. This laboratory is certified under the Clinical Laboratory Improvement Amendments of 1988 (CLIA-88) as qualified to perform high complexity clinical laboratory testing.  The controls stained appropriately.   IHC stains are performed on formalin fixed, paraffin embedded tissue using a  3,3diaminobenzidine (DAB) chromogen and Leica Bond Autostainer System. The staining intensity of the nucleus is score manually and is reported as the percentage of tumor cell nuclei demonstrating specific nuclear staining. The specimens are fixed in 10% Neutral Formali n for at least 6 hours and up to 72hrs. These tests are validated on decalcified tissue. Results should be interpreted with caution given the possibility of false negative results on decalcified specimens. Antibody Clones are as follows ER-clone [id number], PR-clone 16, Ki67- clone MM1. Some of these immunohistochemical stains may have been developed and the performance characteristics determined by [facility].   Hemoglobin     Status: Abnormal   Collection Time: 10/16/23  5:03 AM  Result Value Ref Range   Hemoglobin 9.2 (L) 12.0 - 15.0 g/dL    Comment: Performed at [facility], [address], [place], [place] [address]     Treatments: surgery: robotic assisted laparoscopic hysterectomy with bilateral salpingo-oophorectomy   Discharge Exam: Blood pressure 125/72, pulse 63, temperature 98 F (36.7 C), temperature source Oral, resp. rate 17, height 5' 2.5 (1.588 m), weight 66.7 kg, SpO2 98%. General appearance: alert, cooperative, and no distress Resp: no distress  GI: soft appropriately  tender nondistended no rebound no guarding + BS in all 4 quadrants  Extremities: extremities normal, atraumatic, no cyanosis or edema Incisions well appoximated however erythema present and boils noted under dermabond.   Disposition:   Discharge Instructions     Call MD for:  persistant nausea and vomiting   Complete by: As directed    Call MD for:  redness, tenderness, or signs of infection (pain, swelling, redness, odor or green/yellow discharge around incision site)   Complete by: As directed    Call MD for:  severe uncontrolled pain   Complete by: As directed    Call MD for:  temperature >100.4   Complete by: As  directed    Diet - low sodium heart healthy   Complete by: As directed    Driving Restrictions   Complete by: As directed    Avoid driving for 1 week   Increase activity slowly   Complete by: As directed    Lifting restrictions   Complete by: As directed    Avoid lifting over 10 lbs for 6 weeks and until approved by Dr. Rosalva   No wound care   Complete by: As directed    Sexual Activity Restrictions   Complete by: As directed    Avoid sex for 6 weeks and until approved by Dr. Rosalva      Allergies as of 10/17/2023       Reactions   Metoclopramide  Anxiety, Other (See Comments)   Other Rash   SURGICAL GLUE----  CAUSES BLISTERS/ WEEPING/ RASH--- CELLULITIS   Codeine Itching   Morphine  Sulfate Itching   Tape Rash    ADHESIVE CAUSES RASH,  NOT JUST TAPE BUT ALSO ANY PATCHES        Medication List     TAKE these medications    Acetaminophen  Extra Strength 500 MG Tabs Take 2 tablets (1,000 mg total) by mouth every 8 (eight) hours as needed for mild pain (pain score 1-3). What changed:  how much to take when to take this reasons to take this   buPROPion  150 MG 24 hr tablet Commonly known as: WELLBUTRIN  XL Take 150 mg by mouth daily.   diphenhydrAMINE  50 MG capsule Commonly known as: BENADRYL  Take 1 capsule (50 mg total) by mouth every 8 (eight) hours as needed for itching.   enoxaparin  40 MG/0.4ML injection Commonly known as: LOVENOX  Inject 0.4 mLs (40 mg total) into the skin daily for 28 days.   gabapentin  300 MG capsule Commonly known as: NEURONTIN  Take 300-600 mg by mouth at bedtime as needed.   gabapentin  100 MG capsule Commonly known as: NEURONTIN  Take 1 capsule (100 mg total) by mouth3 (three) to 4 (four) times daily.   ibuprofen  600 MG tablet Commonly known as: ADVIL  Take 1 tablet (600 mg total) by mouth every 6 (six) hours as needed for mild pain (pain score 1-3).   levonorgestrel  20 MCG/DAY Iud Commonly known as: MIRENA  1 each by Intrauterine route  once. INSERTED 09/ 2025   methocarbamol  500 MG tablet Commonly known as: ROBAXIN  Take 1 tablet (500 mg total) by mouth 3 (three) times daily as needed for spasms. Max daily dose of 3 tabs daily. What changed: reasons to take this   naloxone  4 MG/0.1ML Liqd nasal spray kit Commonly known as: Narcan  Place 1 spray into the nose as needed.   ondansetron  4 MG disintegrating tablet Commonly known as: ZOFRAN -ODT Take 1 tablet (4 mg total) by mouth every 8 (eight) hours as needed for nausea  or vomiting.   oxyCODONE  15 MG immediate release tablet Commonly known as: ROXICODONE  Take 0.5-1 tablets (7.5-15 mg total) by mouth 4 (four) times daily as needed for pain. What changed: reasons to take this   pantoprazole  40 MG tablet Commonly known as: PROTONIX  Take 1 tablet (40 mg total) by mouth daily.   Trulance 3 MG Tabs Generic drug: Plecanatide Take 1 tablet by mouth daily as needed.   vitamin B-12 100 MCG tablet Commonly known as: CYANOCOBALAMIN  Take 100 mcg by mouth once a week.        Follow-up Information     Rosalva Sawyer, MD. Go in 2 week(s).   Specialty: Obstetrics and Gynecology Contact information: [address] [place] [address] [phone]                 Signed: Sawyer Rosalva 10/17/2023, 10:00 AM

## 2023-10-16 NOTE — Anesthesia Postprocedure Evaluation (Signed)
 Anesthesia Post Note  Patient: Laura Shah  Procedure(s) Performed: HYSTERECTOMY, TOTAL, LAPAROSCOPIC, ROBOT-ASSISTED WITH SALPINGO-OOPHORECTOMY (Bilateral) REMOVAL, INTRAUTERINE DEVICE (Vagina ) CYSTOSCOPY (Bladder)     Patient location during evaluation: PACU Anesthesia Type: General Level of consciousness: awake Pain management: pain level controlled Vital Signs Assessment: post-procedure vital signs reviewed and stable Respiratory status: spontaneous breathing, nonlabored ventilation and respiratory function stable Cardiovascular status: blood pressure returned to baseline and stable Postop Assessment: no apparent nausea or vomiting Anesthetic complications: no   No notable events documented.  Last Vitals:  Vitals:   10/16/23 0338 10/16/23 0731  BP: (!) 91/49 (!) 107/56  Pulse: 91 74  Resp: 18 17  Temp: 36.8 C 36.8 C  SpO2: 99% 97%    Last Pain:  Vitals:   10/16/23 0827  TempSrc:   PainSc: 9                  Alanmichael Barmore P Mackie Holness

## 2023-10-17 ENCOUNTER — Encounter: Payer: Self-pay | Admitting: Family

## 2023-10-17 ENCOUNTER — Other Ambulatory Visit (HOSPITAL_COMMUNITY): Payer: Self-pay

## 2023-10-17 ENCOUNTER — Inpatient Hospital Stay: Admission: RE | Admit: 2023-10-17 | Source: Ambulatory Visit

## 2023-10-17 ENCOUNTER — Encounter

## 2023-10-17 ENCOUNTER — Encounter: Payer: Self-pay | Admitting: Pulmonary Disease

## 2023-10-17 DIAGNOSIS — N8003 Adenomyosis of the uterus: Secondary | ICD-10-CM | POA: Diagnosis not present

## 2023-10-17 DIAGNOSIS — N8312 Corpus luteum cyst of left ovary: Secondary | ICD-10-CM | POA: Diagnosis not present

## 2023-10-17 DIAGNOSIS — N8311 Corpus luteum cyst of right ovary: Secondary | ICD-10-CM | POA: Diagnosis not present

## 2023-10-17 DIAGNOSIS — D259 Leiomyoma of uterus, unspecified: Secondary | ICD-10-CM | POA: Diagnosis not present

## 2023-10-17 MED ORDER — METHYLPREDNISOLONE 4 MG PO TBPK: 4.0000 mg | ORAL_TABLET | Freq: Three times a day (TID) | ORAL | Status: DC

## 2023-10-17 MED ORDER — METHYLPREDNISOLONE 4 MG PO TBPK: 8.0000 mg | ORAL_TABLET | Freq: Every morning | ORAL | Status: DC

## 2023-10-17 MED ORDER — METHYLPREDNISOLONE 4 MG PO TBPK: 4.0000 mg | ORAL_TABLET | Freq: Four times a day (QID) | ORAL | Status: DC

## 2023-10-17 MED ORDER — MINERAL OIL LIGHT OIL
TOPICAL_OIL | Freq: Once | Status: AC
Start: 2023-10-17 — End: 2023-10-17
  Administered 2023-10-17: 1 via TOPICAL
  Filled 2023-10-17: qty 10

## 2023-10-17 MED ORDER — METHYLPREDNISOLONE 4 MG PO TBPK: 8.0000 mg | ORAL_TABLET | Freq: Every evening | ORAL | Status: DC

## 2023-10-17 MED ORDER — METHYLPREDNISOLONE 4 MG PO TBPK: 4.0000 mg | ORAL_TABLET | ORAL | Status: DC

## 2023-10-17 MED ORDER — METHYLPREDNISOLONE 4 MG PO TABS
ORAL_TABLET | ORAL | 0 refills | Status: AC
  Filled 2023-10-17: qty 21, 6d supply, fill #0

## 2023-10-17 MED ORDER — METHYLPREDNISOLONE 4 MG PO TBPK
4.0000 mg | ORAL_TABLET | ORAL | Status: DC
Start: 2023-10-17 — End: 2023-10-17

## 2023-10-17 MED ORDER — DIPHENHYDRAMINE HCL 25 MG PO CAPS
50.0000 mg | ORAL_CAPSULE | Freq: Four times a day (QID) | ORAL | Status: AC | PRN
  Administered 2023-10-17: 50 mg via ORAL

## 2023-10-17 MED ORDER — DIPHENHYDRAMINE HCL 25 MG PO TABS
50.0000 mg | ORAL_TABLET | Freq: Three times a day (TID) | ORAL | 0 refills | Status: AC | PRN
  Filled 2023-10-17: qty 60, 10d supply, fill #0

## 2023-10-19 DIAGNOSIS — R531 Weakness: Secondary | ICD-10-CM | POA: Diagnosis not present

## 2023-10-19 DIAGNOSIS — M549 Dorsalgia, unspecified: Secondary | ICD-10-CM | POA: Diagnosis not present

## 2023-10-19 DIAGNOSIS — Z90722 Acquired absence of ovaries, bilateral: Secondary | ICD-10-CM | POA: Diagnosis not present

## 2023-10-19 DIAGNOSIS — K219 Gastro-esophageal reflux disease without esophagitis: Secondary | ICD-10-CM | POA: Diagnosis not present

## 2023-10-19 DIAGNOSIS — M5430 Sciatica, unspecified side: Secondary | ICD-10-CM | POA: Diagnosis not present

## 2023-10-19 DIAGNOSIS — K5900 Constipation, unspecified: Secondary | ICD-10-CM | POA: Diagnosis not present

## 2023-10-19 DIAGNOSIS — Z981 Arthrodesis status: Secondary | ICD-10-CM | POA: Diagnosis not present

## 2023-10-19 DIAGNOSIS — Z79899 Other long term (current) drug therapy: Secondary | ICD-10-CM | POA: Diagnosis not present

## 2023-10-19 DIAGNOSIS — G834 Cauda equina syndrome: Secondary | ICD-10-CM | POA: Diagnosis not present

## 2023-10-19 DIAGNOSIS — R200 Anesthesia of skin: Secondary | ICD-10-CM | POA: Diagnosis not present

## 2023-10-19 DIAGNOSIS — M545 Low back pain, unspecified: Secondary | ICD-10-CM | POA: Diagnosis not present

## 2023-10-19 DIAGNOSIS — Z931 Gastrostomy status: Secondary | ICD-10-CM | POA: Diagnosis not present

## 2023-10-19 DIAGNOSIS — Z885 Allergy status to narcotic agent status: Secondary | ICD-10-CM | POA: Diagnosis not present

## 2023-10-19 DIAGNOSIS — Z9089 Acquired absence of other organs: Secondary | ICD-10-CM | POA: Diagnosis not present

## 2023-10-19 DIAGNOSIS — Z9884 Bariatric surgery status: Secondary | ICD-10-CM | POA: Diagnosis not present

## 2023-10-19 DIAGNOSIS — R062 Wheezing: Secondary | ICD-10-CM | POA: Diagnosis not present

## 2023-10-19 DIAGNOSIS — Z9889 Other specified postprocedural states: Secondary | ICD-10-CM | POA: Diagnosis not present

## 2023-10-19 DIAGNOSIS — D649 Anemia, unspecified: Secondary | ICD-10-CM | POA: Diagnosis not present

## 2023-10-19 DIAGNOSIS — R10817 Generalized abdominal tenderness: Secondary | ICD-10-CM | POA: Diagnosis not present

## 2023-10-19 DIAGNOSIS — R202 Paresthesia of skin: Secondary | ICD-10-CM | POA: Diagnosis not present

## 2023-10-19 DIAGNOSIS — R109 Unspecified abdominal pain: Secondary | ICD-10-CM | POA: Diagnosis not present

## 2023-10-19 DIAGNOSIS — Z90710 Acquired absence of both cervix and uterus: Secondary | ICD-10-CM | POA: Diagnosis not present

## 2023-10-19 DIAGNOSIS — Z9049 Acquired absence of other specified parts of digestive tract: Secondary | ICD-10-CM | POA: Diagnosis not present

## 2023-10-19 DIAGNOSIS — Z9079 Acquired absence of other genital organ(s): Secondary | ICD-10-CM | POA: Diagnosis not present

## 2023-10-19 LAB — SURGICAL PATHOLOGY

## 2023-10-20 DIAGNOSIS — M545 Low back pain, unspecified: Secondary | ICD-10-CM | POA: Diagnosis not present

## 2023-10-20 DIAGNOSIS — Z9889 Other specified postprocedural states: Secondary | ICD-10-CM | POA: Diagnosis not present

## 2023-10-22 ENCOUNTER — Ambulatory Visit
Admit: 2023-10-22 | Discharge: 2023-10-22 | Disposition: A | Discharge status: Home / Self Care | Attending: Family Medicine | Admitting: Family Medicine

## 2023-10-22 DIAGNOSIS — N6489 Other specified disorders of breast: Secondary | ICD-10-CM | POA: Diagnosis not present

## 2023-10-22 DIAGNOSIS — Q678 Other congenital deformities of chest: Secondary | ICD-10-CM

## 2023-10-22 DIAGNOSIS — R928 Other abnormal and inconclusive findings on diagnostic imaging of breast: Secondary | ICD-10-CM | POA: Diagnosis not present

## 2023-10-22 DIAGNOSIS — N951 Menopausal and female climacteric states: Secondary | ICD-10-CM | POA: Diagnosis not present

## 2023-10-29 DIAGNOSIS — Z79899 Other long term (current) drug therapy: Secondary | ICD-10-CM | POA: Diagnosis not present

## 2023-10-29 DIAGNOSIS — M5431 Sciatica, right side: Secondary | ICD-10-CM | POA: Diagnosis not present

## 2023-10-29 DIAGNOSIS — E538 Deficiency of other specified B group vitamins: Secondary | ICD-10-CM | POA: Diagnosis not present

## 2023-10-29 DIAGNOSIS — F1120 Opioid dependence, uncomplicated: Secondary | ICD-10-CM | POA: Diagnosis not present

## 2023-10-29 DIAGNOSIS — E611 Iron deficiency: Secondary | ICD-10-CM | POA: Diagnosis not present

## 2023-10-29 DIAGNOSIS — Z13220 Encounter for screening for lipoid disorders: Secondary | ICD-10-CM | POA: Diagnosis not present

## 2023-10-29 DIAGNOSIS — M545 Low back pain, unspecified: Secondary | ICD-10-CM | POA: Diagnosis not present

## 2023-11-06 DIAGNOSIS — Z1331 Encounter for screening for depression: Secondary | ICD-10-CM | POA: Diagnosis not present

## 2023-11-06 DIAGNOSIS — M4326 Fusion of spine, lumbar region: Secondary | ICD-10-CM | POA: Diagnosis not present

## 2023-11-12 DIAGNOSIS — M4326 Fusion of spine, lumbar region: Secondary | ICD-10-CM | POA: Diagnosis not present

## 2023-11-12 DIAGNOSIS — M7062 Trochanteric bursitis, left hip: Secondary | ICD-10-CM | POA: Diagnosis not present

## 2023-11-21 DIAGNOSIS — N951 Menopausal and female climacteric states: Secondary | ICD-10-CM | POA: Diagnosis not present

## 2023-11-21 DIAGNOSIS — E538 Deficiency of other specified B group vitamins: Secondary | ICD-10-CM | POA: Diagnosis not present

## 2023-11-26 DIAGNOSIS — F1120 Opioid dependence, uncomplicated: Secondary | ICD-10-CM | POA: Diagnosis not present

## 2023-11-26 DIAGNOSIS — F331 Major depressive disorder, recurrent, moderate: Secondary | ICD-10-CM | POA: Diagnosis not present

## 2023-11-26 DIAGNOSIS — M545 Low back pain, unspecified: Secondary | ICD-10-CM | POA: Diagnosis not present

## 2023-11-26 DIAGNOSIS — Z79899 Other long term (current) drug therapy: Secondary | ICD-10-CM | POA: Diagnosis not present

## 2023-11-26 DIAGNOSIS — M5431 Sciatica, right side: Secondary | ICD-10-CM | POA: Diagnosis not present

## 2023-12-04 DIAGNOSIS — M4326 Fusion of spine, lumbar region: Secondary | ICD-10-CM | POA: Diagnosis not present

## 2023-12-04 DIAGNOSIS — M5416 Radiculopathy, lumbar region: Secondary | ICD-10-CM | POA: Diagnosis not present

## 2023-12-06 DIAGNOSIS — F331 Major depressive disorder, recurrent, moderate: Secondary | ICD-10-CM | POA: Diagnosis not present

## 2023-12-12 DIAGNOSIS — R200 Anesthesia of skin: Secondary | ICD-10-CM | POA: Diagnosis not present

## 2023-12-12 DIAGNOSIS — M2550 Pain in unspecified joint: Secondary | ICD-10-CM | POA: Diagnosis not present

## 2023-12-12 DIAGNOSIS — E538 Deficiency of other specified B group vitamins: Secondary | ICD-10-CM | POA: Diagnosis not present

## 2023-12-12 DIAGNOSIS — R251 Tremor, unspecified: Secondary | ICD-10-CM | POA: Diagnosis not present

## 2023-12-12 DIAGNOSIS — R531 Weakness: Secondary | ICD-10-CM | POA: Diagnosis not present

## 2023-12-12 DIAGNOSIS — R202 Paresthesia of skin: Secondary | ICD-10-CM | POA: Diagnosis not present

## 2023-12-16 DIAGNOSIS — G5623 Lesion of ulnar nerve, bilateral upper limbs: Secondary | ICD-10-CM | POA: Diagnosis not present

## 2023-12-20 DIAGNOSIS — F331 Major depressive disorder, recurrent, moderate: Secondary | ICD-10-CM | POA: Diagnosis not present

## 2023-12-24 DIAGNOSIS — M545 Low back pain, unspecified: Secondary | ICD-10-CM | POA: Diagnosis not present

## 2023-12-24 DIAGNOSIS — M5431 Sciatica, right side: Secondary | ICD-10-CM | POA: Diagnosis not present

## 2023-12-24 DIAGNOSIS — G8929 Other chronic pain: Secondary | ICD-10-CM | POA: Diagnosis not present

## 2023-12-24 DIAGNOSIS — Z79899 Other long term (current) drug therapy: Secondary | ICD-10-CM | POA: Diagnosis not present

## 2023-12-24 DIAGNOSIS — F1120 Opioid dependence, uncomplicated: Secondary | ICD-10-CM | POA: Diagnosis not present

## 2024-01-02 DIAGNOSIS — M7062 Trochanteric bursitis, left hip: Secondary | ICD-10-CM | POA: Diagnosis not present

## 2024-01-02 DIAGNOSIS — M4326 Fusion of spine, lumbar region: Secondary | ICD-10-CM | POA: Diagnosis not present

## 2024-01-02 DIAGNOSIS — M7061 Trochanteric bursitis, right hip: Secondary | ICD-10-CM | POA: Diagnosis not present

## 2024-01-14 DIAGNOSIS — F1120 Opioid dependence, uncomplicated: Secondary | ICD-10-CM | POA: Diagnosis not present

## 2024-01-14 DIAGNOSIS — M545 Low back pain, unspecified: Secondary | ICD-10-CM | POA: Diagnosis not present

## 2024-01-14 DIAGNOSIS — R202 Paresthesia of skin: Secondary | ICD-10-CM | POA: Diagnosis not present

## 2024-01-14 DIAGNOSIS — Z79899 Other long term (current) drug therapy: Secondary | ICD-10-CM | POA: Diagnosis not present

## 2024-01-14 DIAGNOSIS — M5431 Sciatica, right side: Secondary | ICD-10-CM | POA: Diagnosis not present

## 2024-01-15 DIAGNOSIS — G5622 Lesion of ulnar nerve, left upper limb: Secondary | ICD-10-CM | POA: Diagnosis not present

## 2024-01-15 DIAGNOSIS — M47812 Spondylosis without myelopathy or radiculopathy, cervical region: Secondary | ICD-10-CM | POA: Diagnosis not present

## 2024-01-15 DIAGNOSIS — M25521 Pain in right elbow: Secondary | ICD-10-CM | POA: Diagnosis not present

## 2024-01-17 DIAGNOSIS — E538 Deficiency of other specified B group vitamins: Secondary | ICD-10-CM | POA: Diagnosis not present

## 2024-01-17 DIAGNOSIS — N951 Menopausal and female climacteric states: Secondary | ICD-10-CM | POA: Diagnosis not present

## 2024-01-17 DIAGNOSIS — R030 Elevated blood-pressure reading, without diagnosis of hypertension: Secondary | ICD-10-CM | POA: Diagnosis not present

## 2024-01-21 DIAGNOSIS — G5623 Lesion of ulnar nerve, bilateral upper limbs: Secondary | ICD-10-CM | POA: Diagnosis not present

## 2024-01-21 DIAGNOSIS — M79622 Pain in left upper arm: Secondary | ICD-10-CM | POA: Diagnosis not present

## 2024-01-21 DIAGNOSIS — M79643 Pain in unspecified hand: Secondary | ICD-10-CM | POA: Diagnosis not present

## 2024-01-21 DIAGNOSIS — M79621 Pain in right upper arm: Secondary | ICD-10-CM | POA: Diagnosis not present

## 2024-01-21 DIAGNOSIS — G8929 Other chronic pain: Secondary | ICD-10-CM | POA: Diagnosis not present
